# Patient Record
Sex: Female | Born: 1950
Health system: Southern US, Community
[De-identification: ages and names within clinical notes are randomized; demographics above are authoritative.]

## PROBLEM LIST (undated history)

## (undated) DIAGNOSIS — R600 Localized edema: Secondary | ICD-10-CM

## (undated) DIAGNOSIS — E785 Hyperlipidemia, unspecified: Secondary | ICD-10-CM

## (undated) DIAGNOSIS — Z8489 Family history of other specified conditions: Secondary | ICD-10-CM

## (undated) DIAGNOSIS — R42 Dizziness and giddiness: Secondary | ICD-10-CM

## (undated) DIAGNOSIS — R490 Dysphonia: Secondary | ICD-10-CM

## (undated) DIAGNOSIS — Z9889 Other specified postprocedural states: Secondary | ICD-10-CM

## (undated) DIAGNOSIS — D649 Anemia, unspecified: Secondary | ICD-10-CM

## (undated) DIAGNOSIS — M1990 Unspecified osteoarthritis, unspecified site: Secondary | ICD-10-CM

## (undated) DIAGNOSIS — S060X9A Concussion with loss of consciousness of unspecified duration, initial encounter: Secondary | ICD-10-CM

## (undated) DIAGNOSIS — R112 Nausea with vomiting, unspecified: Secondary | ICD-10-CM

## (undated) DIAGNOSIS — K219 Gastro-esophageal reflux disease without esophagitis: Secondary | ICD-10-CM

## (undated) DIAGNOSIS — N281 Cyst of kidney, acquired: Secondary | ICD-10-CM

## (undated) DIAGNOSIS — K8020 Calculus of gallbladder without cholecystitis without obstruction: Secondary | ICD-10-CM

## (undated) DIAGNOSIS — T4145XA Adverse effect of unspecified anesthetic, initial encounter: Secondary | ICD-10-CM

## (undated) DIAGNOSIS — R319 Hematuria, unspecified: Secondary | ICD-10-CM

## (undated) DIAGNOSIS — N3941 Urge incontinence: Secondary | ICD-10-CM

## (undated) DIAGNOSIS — N390 Urinary tract infection, site not specified: Secondary | ICD-10-CM

## (undated) DIAGNOSIS — C801 Malignant (primary) neoplasm, unspecified: Secondary | ICD-10-CM

## (undated) DIAGNOSIS — T8859XA Other complications of anesthesia, initial encounter: Secondary | ICD-10-CM

## (undated) DIAGNOSIS — I10 Essential (primary) hypertension: Secondary | ICD-10-CM

## (undated) DIAGNOSIS — E663 Overweight: Secondary | ICD-10-CM

## (undated) DIAGNOSIS — Z8719 Personal history of other diseases of the digestive system: Secondary | ICD-10-CM

## (undated) DIAGNOSIS — Z87442 Personal history of urinary calculi: Secondary | ICD-10-CM

## (undated) DIAGNOSIS — G4730 Sleep apnea, unspecified: Secondary | ICD-10-CM

## (undated) DIAGNOSIS — E559 Vitamin D deficiency, unspecified: Secondary | ICD-10-CM

## (undated) DIAGNOSIS — R569 Unspecified convulsions: Secondary | ICD-10-CM

## (undated) HISTORY — DX: Gastro-esophageal reflux disease without esophagitis: K21.9

## (undated) HISTORY — DX: Cyst of kidney, acquired: N28.1

## (undated) HISTORY — DX: Hyperlipidemia, unspecified: E78.5

## (undated) HISTORY — DX: Urge incontinence: N39.41

## (undated) HISTORY — PX: BREAST BIOPSY: SHX20

## (undated) HISTORY — DX: Urinary tract infection, site not specified: N39.0

## (undated) HISTORY — DX: Hematuria, unspecified: R31.9

## (undated) HISTORY — DX: Unspecified osteoarthritis, unspecified site: M19.90

## (undated) HISTORY — DX: Vitamin D deficiency, unspecified: E55.9

## (undated) HISTORY — DX: Calculus of gallbladder without cholecystitis without obstruction: K80.20

## (undated) HISTORY — DX: Localized edema: R60.0

## (undated) HISTORY — DX: Dysphonia: R49.0

## (undated) HISTORY — DX: Anemia, unspecified: D64.9

## (undated) HISTORY — DX: Dizziness and giddiness: R42

## (undated) HISTORY — DX: Overweight: E66.3

---

## 1973-12-13 HISTORY — PX: TUBAL LIGATION: SHX77

## 1979-12-14 HISTORY — PX: ABDOMINAL HYSTERECTOMY: SHX81

## 1991-12-14 DIAGNOSIS — C801 Malignant (primary) neoplasm, unspecified: Secondary | ICD-10-CM

## 1991-12-14 HISTORY — DX: Malignant (primary) neoplasm, unspecified: C80.1

## 1998-05-05 ENCOUNTER — Emergency Department (HOSPITAL_COMMUNITY): Admission: EM | Admit: 1998-05-05 | Discharge: 1998-05-05 | Payer: Self-pay | Admitting: Emergency Medicine

## 1999-02-19 ENCOUNTER — Other Ambulatory Visit: Admission: RE | Admit: 1999-02-19 | Discharge: 1999-02-19 | Payer: Self-pay | Admitting: *Deleted

## 1999-05-04 ENCOUNTER — Emergency Department (HOSPITAL_COMMUNITY): Admission: EM | Admit: 1999-05-04 | Discharge: 1999-05-04 | Payer: Self-pay | Admitting: *Deleted

## 1999-05-04 ENCOUNTER — Encounter: Payer: Self-pay | Admitting: Emergency Medicine

## 2000-10-21 ENCOUNTER — Emergency Department (HOSPITAL_COMMUNITY): Admission: EM | Admit: 2000-10-21 | Discharge: 2000-10-21 | Payer: Self-pay | Admitting: Emergency Medicine

## 2003-02-28 ENCOUNTER — Emergency Department (HOSPITAL_COMMUNITY): Admission: EM | Admit: 2003-02-28 | Discharge: 2003-02-28 | Payer: Self-pay | Admitting: Emergency Medicine

## 2003-11-01 ENCOUNTER — Other Ambulatory Visit: Payer: Self-pay

## 2003-12-14 HISTORY — PX: HERNIA REPAIR: SHX51

## 2004-10-01 ENCOUNTER — Ambulatory Visit: Payer: Self-pay | Admitting: Internal Medicine

## 2004-10-21 ENCOUNTER — Emergency Department (HOSPITAL_COMMUNITY): Admission: EM | Admit: 2004-10-21 | Discharge: 2004-10-21 | Payer: Self-pay | Admitting: Family Medicine

## 2004-11-30 ENCOUNTER — Emergency Department (HOSPITAL_COMMUNITY): Admission: EM | Admit: 2004-11-30 | Discharge: 2004-11-30 | Payer: Self-pay | Admitting: Emergency Medicine

## 2006-04-10 ENCOUNTER — Emergency Department: Payer: Self-pay | Admitting: Internal Medicine

## 2006-04-10 ENCOUNTER — Other Ambulatory Visit: Payer: Self-pay

## 2006-12-30 ENCOUNTER — Emergency Department (HOSPITAL_COMMUNITY): Admission: EM | Admit: 2006-12-30 | Discharge: 2006-12-31 | Payer: Self-pay | Admitting: Emergency Medicine

## 2007-02-17 ENCOUNTER — Emergency Department: Payer: Self-pay | Admitting: Internal Medicine

## 2008-08-22 ENCOUNTER — Emergency Department (HOSPITAL_COMMUNITY): Admission: EM | Admit: 2008-08-22 | Discharge: 2008-08-23 | Payer: Self-pay | Admitting: Emergency Medicine

## 2008-08-29 ENCOUNTER — Ambulatory Visit: Payer: Self-pay | Admitting: Internal Medicine

## 2008-08-30 ENCOUNTER — Emergency Department: Payer: Self-pay | Admitting: Emergency Medicine

## 2009-08-06 ENCOUNTER — Emergency Department (HOSPITAL_COMMUNITY): Admission: EM | Admit: 2009-08-06 | Discharge: 2009-08-06 | Payer: Self-pay | Admitting: Emergency Medicine

## 2009-11-22 ENCOUNTER — Emergency Department (HOSPITAL_COMMUNITY): Admission: EM | Admit: 2009-11-22 | Discharge: 2009-11-22 | Payer: Self-pay | Admitting: Emergency Medicine

## 2010-01-17 ENCOUNTER — Encounter: Admission: RE | Admit: 2010-01-17 | Discharge: 2010-01-17 | Payer: Self-pay | Admitting: Sports Medicine

## 2011-01-03 ENCOUNTER — Encounter: Payer: Self-pay | Admitting: *Deleted

## 2011-05-12 ENCOUNTER — Ambulatory Visit: Payer: Self-pay | Admitting: Emergency Medicine

## 2014-07-17 DIAGNOSIS — L662 Folliculitis decalvans: Secondary | ICD-10-CM | POA: Insufficient documentation

## 2014-12-10 ENCOUNTER — Ambulatory Visit: Payer: Self-pay | Admitting: Urology

## 2015-01-08 ENCOUNTER — Emergency Department: Payer: Self-pay | Admitting: Emergency Medicine

## 2015-01-08 LAB — PROTIME-INR
INR: 1
Prothrombin Time: 13 secs (ref 11.5–14.7)

## 2015-01-08 LAB — COMPREHENSIVE METABOLIC PANEL
Albumin: 3.4 g/dL (ref 3.4–5.0)
Alkaline Phosphatase: 85 U/L (ref 46–116)
Anion Gap: 7 (ref 7–16)
BUN: 23 mg/dL — ABNORMAL HIGH (ref 7–18)
Bilirubin,Total: 0.2 mg/dL (ref 0.2–1.0)
Calcium, Total: 8.7 mg/dL (ref 8.5–10.1)
Chloride: 109 mmol/L — ABNORMAL HIGH (ref 98–107)
Co2: 29 mmol/L (ref 21–32)
Creatinine: 1.14 mg/dL (ref 0.60–1.30)
EGFR (African American): >60
EGFR (Non-African Amer.): 51 — ABNORMAL LOW
Glucose: 97 mg/dL (ref 65–99)
Osmolality: 292 (ref 275–301)
Potassium: 4 mmol/L (ref 3.5–5.1)
SGOT(AST): 26 U/L (ref 15–37)
SGPT (ALT): 28 U/L (ref 14–63)
Sodium: 145 mmol/L (ref 136–145)
Total Protein: 7.3 g/dL (ref 6.4–8.2)

## 2015-01-08 LAB — CBC WITH DIFFERENTIAL/PLATELET
Basophil #: 0.1 10*3/uL (ref 0.0–0.1)
Basophil %: 0.5 %
Eosinophil #: 0.2 10*3/uL (ref 0.0–0.7)
Eosinophil %: 1.3 %
HCT: 39.7 % (ref 35.0–47.0)
HGB: 12.5 g/dL (ref 12.0–16.0)
Lymphocyte #: 3.6 10*3/uL (ref 1.0–3.6)
Lymphocyte %: 31.5 %
MCH: 27.7 pg (ref 26.0–34.0)
MCHC: 31.5 g/dL — ABNORMAL LOW (ref 32.0–36.0)
MCV: 88 fL (ref 80–100)
Monocyte #: 1 x10 3/mm — ABNORMAL HIGH (ref 0.2–0.9)
Monocyte %: 8.4 %
Neutrophil #: 6.7 10*3/uL — ABNORMAL HIGH (ref 1.4–6.5)
Neutrophil %: 58.3 %
Platelet: 218 10*3/uL (ref 150–440)
RBC: 4.53 10*6/uL (ref 3.80–5.20)
RDW: 14 % (ref 11.5–14.5)
WBC: 11.5 10*3/uL — ABNORMAL HIGH (ref 3.6–11.0)

## 2015-04-13 NOTE — Consult Note (Signed)
PATIENT NAME:  Ann Barker, Ann Barker MR#:  [id number] DATE OF BIRTH:  1951/10/31  DATE OF CONSULTATION:  01/08/2015  ATTENDING PHYSICIAN:  Valli Glance. Owens Shark, MD  CONSULTING PHYSICIAN:  Roena Malady, MD  REASON FOR CONSULTATION:  Report request regarding foreign body in the upper aerodigestive tract.   HISTORY OF PRESENT ILLNESS:  This is a 64 year old female who at approximately 11:30 p.m. last evening was eating a cake pinwheel and subsequently after eating this felt like something was hung in her throat. She was then brought to the Emergency Room, where workup consisted of PA and lateral neck films that showed a foreign body stuck in the right, what appeared to be piriform sinus region. Since that time, she has felt like there is something present there. Her airway has never been compromised. Voice is slightly hoarse but more related to pain.   PAST MEDICAL HISTORY:  Unremarkable. She takes only a vitamin according to her daughter. She has a history of chronic back pain.   PAST SURGICAL HISTORY:  She had a hysterectomy and tubal ligation in the past.   SOCIAL HISTORY:  No alcohol or tobacco.   PHYSICAL EXAMINATION:  The external ears appeared normal. The anterior nose was patent. The oral cavity and oropharynx were benign. Palpation of the neck was benign. Her nose was topically decongested with oxymetazoline. A flexed fiber-optic laryngoscope was introduced into the back of the nose. She had a massive gagging response to this, and during that she coughed up a pin with a blue tip on the end. Immediately, she felt better. We retrieved this pin and kept it as a specimen. I repeated the laryngoscopy, this time no gag reflex. Piriform sinuses appeared clear. I could see no active bleeding. She had excellent vocal cord mobility.   IMPRESSION:  Foreign body, most likely right piriform sinus space on the x-ray. She coughed this up spontaneously during the exam. Repeat endoscopy showed no further  foreign body. I have recommended she be discharged to home on a clear liquid diet this morning, soft diet this afternoon, and regular diet tomorrow. She will follow up with me in my office on an as-needed basis.    ____________________________ Roena Malady, MD ctm:nb D: 01/08/2015 06:34:16 ET T: 01/08/2015 06:43:55 ET JOB#: [id number]  cc: Roena Malady, MD, <Dictator> Roena Malady MD ELECTRONICALLY SIGNED 02/07/2015 7:53

## 2015-06-13 ENCOUNTER — Other Ambulatory Visit: Payer: Self-pay | Admitting: Urology

## 2015-06-13 DIAGNOSIS — N2889 Other specified disorders of kidney and ureter: Secondary | ICD-10-CM

## 2015-07-09 DIAGNOSIS — M542 Cervicalgia: Secondary | ICD-10-CM | POA: Insufficient documentation

## 2015-08-16 ENCOUNTER — Other Ambulatory Visit: Payer: Self-pay

## 2015-08-16 ENCOUNTER — Encounter: Payer: Self-pay | Admitting: Emergency Medicine

## 2015-08-16 ENCOUNTER — Emergency Department
Admission: EM | Admit: 2015-08-16 | Discharge: 2015-08-16 | Disposition: A | Discharge status: Home / Self Care | Payer: Medicare HMO | Attending: Emergency Medicine | Admitting: Emergency Medicine

## 2015-08-16 ENCOUNTER — Emergency Department: Payer: Medicare HMO

## 2015-08-16 DIAGNOSIS — R2243 Localized swelling, mass and lump, lower limb, bilateral: Secondary | ICD-10-CM | POA: Insufficient documentation

## 2015-08-16 DIAGNOSIS — I10 Essential (primary) hypertension: Secondary | ICD-10-CM | POA: Insufficient documentation

## 2015-08-16 DIAGNOSIS — Z880 Allergy status to penicillin: Secondary | ICD-10-CM | POA: Diagnosis not present

## 2015-08-16 DIAGNOSIS — R609 Edema, unspecified: Secondary | ICD-10-CM

## 2015-08-16 HISTORY — DX: Essential (primary) hypertension: I10

## 2015-08-16 LAB — CBC WITH DIFFERENTIAL/PLATELET
Basophils Absolute: 0.1 10*3/uL (ref 0–0.1)
Basophils Relative: 1 %
Eosinophils Absolute: 0.2 10*3/uL (ref 0–0.7)
Eosinophils Relative: 2 %
HCT: 35.9 % (ref 35.0–47.0)
Hemoglobin: 11.6 g/dL — ABNORMAL LOW (ref 12.0–16.0)
Lymphocytes Relative: 31 %
Lymphs Abs: 2.6 10*3/uL (ref 1.0–3.6)
MCH: 28.4 pg (ref 26.0–34.0)
MCHC: 32.3 g/dL (ref 32.0–36.0)
MCV: 87.9 fL (ref 80.0–100.0)
Monocytes Absolute: 0.9 10*3/uL (ref 0.2–0.9)
Monocytes Relative: 11 %
Neutro Abs: 4.7 10*3/uL (ref 1.4–6.5)
Neutrophils Relative %: 55 %
Platelets: 221 10*3/uL (ref 150–440)
RBC: 4.08 MIL/uL (ref 3.80–5.20)
RDW: 14.4 % (ref 11.5–14.5)
WBC: 8.5 10*3/uL (ref 3.6–11.0)

## 2015-08-16 LAB — COMPREHENSIVE METABOLIC PANEL
ALT: 23 U/L (ref 14–54)
AST: 27 U/L (ref 15–41)
Albumin: 3.7 g/dL (ref 3.5–5.0)
Alkaline Phosphatase: 67 U/L (ref 38–126)
Anion gap: 5 (ref 5–15)
BUN: 18 mg/dL (ref 6–20)
CO2: 27 mmol/L (ref 22–32)
Calcium: 8.9 mg/dL (ref 8.9–10.3)
Chloride: 112 mmol/L — ABNORMAL HIGH (ref 101–111)
Creatinine, Ser: 0.89 mg/dL (ref 0.44–1.00)
GFR calc Af Amer: >60 mL/min (ref >60)
GFR calc non Af Amer: >60 mL/min (ref >60)
Glucose, Bld: 97 mg/dL (ref 65–99)
Potassium: 3.9 mmol/L (ref 3.5–5.1)
Sodium: 144 mmol/L (ref 135–145)
Total Bilirubin: 0.7 mg/dL (ref 0.3–1.2)
Total Protein: 7 g/dL (ref 6.5–8.1)

## 2015-08-16 LAB — BRAIN NATRIURETIC PEPTIDE: B Natriuretic Peptide: 79 pg/mL (ref 0.0–100.0)

## 2015-08-16 LAB — TROPONIN I: Troponin I: <0.03 ng/mL (ref <0.031)

## 2015-08-16 MED ORDER — HYDROCHLOROTHIAZIDE 12.5 MG PO CAPS: 12.5000 mg | ORAL_CAPSULE | Freq: Every day | ORAL | Status: DC

## 2015-08-16 MED ORDER — HYDROCHLOROTHIAZIDE 12.5 MG PO CAPS
12.5000 mg | ORAL_CAPSULE | Freq: Every day | ORAL | Status: DC
  Administered 2015-08-16: 12.5 mg via ORAL
  Filled 2015-08-16: qty 1

## 2015-08-16 NOTE — Discharge Instructions (Signed)
Peripheral Edema °You have swelling in your legs (peripheral edema). This swelling is due to excess accumulation of salt and water in your body. Edema may be a sign of heart, kidney or liver disease, or a side effect of a medication. It may also be due to problems in the leg veins. Elevating your legs and using special support stockings may be very helpful, if the cause of the swelling is due to poor venous circulation. Avoid long periods of standing, whatever the cause. °Treatment of edema depends on identifying the cause. Chips, pretzels, pickles and other salty foods should be avoided. Restricting salt in your diet is almost always needed. Water pills (diuretics) are often used to remove the excess salt and water from your body via urine. These medicines prevent the kidney from reabsorbing sodium. This increases urine flow. °Diuretic treatment may also result in lowering of potassium levels in your body. Potassium supplements may be needed if you have to use diuretics daily. Daily weights can help you keep track of your progress in clearing your edema. You should call your caregiver for follow up care as recommended. °SEEK IMMEDIATE MEDICAL CARE IF:  °· You have increased swelling, pain, redness, or heat in your legs. °· You develop shortness of breath, especially when lying down. °· You develop chest or abdominal pain, weakness, or fainting. °· You have a fever. °Document Released: 01/06/2005 Document Revised: 02/21/2012 Document Reviewed: 12/17/2009 °ExitCare® Patient Information ©2015 [employer]. This information is not intended to replace advice given to you by your health care provider. Make sure you discuss any questions you have with your health care provider. ° °

## 2015-08-16 NOTE — ED Notes (Signed)
Reports ankles swelling x 3 wks. Denies sob or cp.

## 2015-08-16 NOTE — ED Provider Notes (Signed)
[facility] Emergency Department Provider Note  ____________________________________________  Time seen: Approximately 11 AM  I have reviewed the triage vital signs and the nursing notes.   HISTORY  Chief Complaint Leg Swelling    HPI Ann Barker is a 64 y.o. female with a history of hypertension who is presenting today with bilateral lower extremity edema that is been increasing over the past month. She denies any shortness of breath or chest pain. She says that she has had swelling similar to this in the past however, today her left leg is greater in size than her right. In the past her right has been greater than her left. She said that it is resolved spontaneously in the past. She has no history of blood clots and does not take any anticoagulants. She does note a recent trip from [place]. No fever home. No injury to the lower extremities.   Past Medical History  Diagnosis Date  . Hypertension     There are no active problems to display for this patient.   Past Surgical History  Procedure Laterality Date  . Abdominal hysterectomy    . Hernia repair      No current outpatient prescriptions on file.  Allergies Codeine; Morphine and related; and Penicillins  History reviewed. No pertinent family history.  Social History Social History  Substance Use Topics  . Smoking status: Never Smoker   . Smokeless tobacco: None  . Alcohol Use: No    Review of Systems Constitutional: No fever/chills Eyes: No visual changes. ENT: No sore throat. Cardiovascular: Denies chest pain. Respiratory: Denies shortness of breath. Gastrointestinal: No abdominal pain.  No nausea, no vomiting.  No diarrhea.  No constipation. Genitourinary: Negative for dysuria. Musculoskeletal: Negative for back pain. Skin: Negative for rash. Neurological: Negative for headaches, focal weakness or numbness.  10-point ROS otherwise  negative.  ____________________________________________   PHYSICAL EXAM:  VITAL SIGNS: ED Triage Vitals  Enc Vitals Group     BP 08/16/15 0848 188/107 mmHg     Pulse Rate 08/16/15 0848 73     Resp 08/16/15 0848 18     Temp 08/16/15 0848 98.1 F (36.7 C)     Temp Source 08/16/15 0848 Oral     SpO2 08/16/15 0848 100 %     Weight 08/16/15 0848 238 lb (107.956 kg)     Height 08/16/15 0848 5\' 4"  (1.626 m)     Head Cir --      Peak Flow --      Pain Score 08/16/15 1122 6     Pain Loc --      Pain Edu? --      Excl. in [place]? --     Constitutional: Alert and oriented. Well appearing and in no acute distress. Eyes: Conjunctivae are normal. PERRL. EOMI. Head: Atraumatic. Nose: No congestion/rhinnorhea. Mouth/Throat: Mucous membranes are moist.  Oropharynx non-erythematous. Neck: No stridor.   Cardiovascular: Normal rate, regular rhythm. Grossly normal heart sounds.  Good peripheral circulation. Respiratory: Normal respiratory effort.  No retractions. Lungs CTAB. Gastrointestinal: Soft and nontender. No distention. No abdominal bruits. No CVA tenderness. Musculoskeletal: Bilateral lower extremity edema which is moderate to severe and greater on the left side. There are bilateral dorsalis pedis pulses that are equal. Patient is able to range her toes.  Neurologic:  Normal speech and language. No gross focal neurologic deficits are appreciated. No gait instability. Skin:  Skin is warm, dry and intact. No rash noted. Psychiatric: Mood and affect are normal.  Speech and behavior are normal.  ____________________________________________   LABS (all labs ordered are listed, but only abnormal results are displayed)  Labs Reviewed  COMPREHENSIVE METABOLIC PANEL - Abnormal; Notable for the following:    Chloride 112 (*)    All other components within normal limits  CBC WITH DIFFERENTIAL/PLATELET - Abnormal; Notable for the following:    Hemoglobin 11.6 (*)    All other components within  normal limits  BRAIN NATRIURETIC PEPTIDE  TROPONIN I   ____________________________________________  EKG  ED ECG REPORT I, Doran Stabler, the attending physician, personally viewed and interpreted this ECG.   Date: 08/16/2015  EKG Time: 1105  Rate: 65  Rhythm: normal EKG, normal sinus rhythm  Axis: Normal axis  Intervals:none  ST&T Change: No ST elevations or depressions. No abnormal T-wave inversions.  ____________________________________________  RADIOLOGY  Mild cardiomegaly. I personally reviewed these films.  No evidence of lower extremity deep vein thrombosis. ____________________________________________   PROCEDURES    ____________________________________________   INITIAL IMPRESSION / ASSESSMENT AND PLAN / ED COURSE  Pertinent labs & imaging results that were available during my care of the patient were reviewed by me and considered in my medical decision making (see chart for details).  ----------------------------------------- 1:09 PM on 08/16/2015 -----------------------------------------  Patient is resting comfortably at this time. Counseled the patient about her lab results as well as imaging results and that there was no sign of deep vein thrombosis. I do not think the patient has cellulitis. She has no elevation in her white count or fever. She says that she has had a previous similar issue that resolved spontaneously. She does say that she used to wear compression stockings. She does keep her legs elevated at night but she says that the swelling has not gone down overnight. She said that she used to be on hydrochlorothiazide which helped her leg swelling which is often now. She said she had some issues with the veins in her lower legs that she needed this for in the past. I will restart her on her chlorothiazide which she will need for her blood pressure as well as her swelling. She says that she will be able to get a replacement pain or of  compression stockings. Discharged home and will follow-up at primary care. Currently does not have primary care and we'll give number for [facility]. Patient does have insurance and says that she will call to make an appointment. ____________________________________________   FINAL CLINICAL IMPRESSION(S) / ED DIAGNOSES  Acute peripheral edema. Initial visit.    Orbie Pyo, MD 08/16/15 [phone]

## 2015-08-16 NOTE — ED Notes (Addendum)
Pt reports being diagnosed 11/2014 with lesions on kidneys for which she is seeing Dr. Erlene Quan in [place].

## 2015-08-16 NOTE — ED Notes (Signed)
Pt presents with bil ankle and lower leg swelling, non-pitting edema to bil feet, pitting to bil lower leg, +2/+3, left worse than right.

## 2015-09-09 ENCOUNTER — Ambulatory Visit (INDEPENDENT_AMBULATORY_CARE_PROVIDER_SITE_OTHER): Payer: Medicare HMO | Admitting: Primary Care

## 2015-09-09 ENCOUNTER — Encounter: Payer: Self-pay | Admitting: Primary Care

## 2015-09-09 VITALS — BP 120/88 | HR 81 | Temp 97.6°F | Ht 64.0 in | Wt 244.8 lb

## 2015-09-09 DIAGNOSIS — R600 Localized edema: Secondary | ICD-10-CM

## 2015-09-09 DIAGNOSIS — Z87448 Personal history of other diseases of urinary system: Secondary | ICD-10-CM | POA: Diagnosis not present

## 2015-09-09 DIAGNOSIS — D649 Anemia, unspecified: Secondary | ICD-10-CM

## 2015-09-09 DIAGNOSIS — I10 Essential (primary) hypertension: Secondary | ICD-10-CM

## 2015-09-09 DIAGNOSIS — R3129 Other microscopic hematuria: Secondary | ICD-10-CM | POA: Insufficient documentation

## 2015-09-09 NOTE — Progress Notes (Signed)
Subjective:    Patient ID: Ann Barker Roles, female    DOB: 11-05-1951, 64 y.o.   MRN: [id number]  HPI  [name] is a 64 year old female who presents today to establish care and discuss the problems mentioned below. Will obtain old records.  1) Lower extremity edema: Longstanding history for the past several years. She was evaluated in the ED on 08/16/2015 for lower extremity edema that had been increasing over the past month. She underwent evaluation with labs, ECG, ultrasound of her lower extremities, and xray. All testing was unremarkable except for mild cardiomegaly in xray. She was restarted on her HCTZ 12.5 mg for hypertension and lower extremity edema. Overall her lower extremity edema has reduced but continues to be elevated and bothersome, especially to her left lower extremity. She's been wearing compression hose and elevating her legs when at rest. She's had vascular surgery to her veins in May of 2014 through December 2015. Denies chest pain and cough. Will get shortness of breath while climbing stairs.   2) Essential Hypertension: Diagnosed several years ago with fluctuations in numbers over the years. Currently managed on HCTZ 12.5 mg. Denies chest pain.   BP Readings from Last 3 Encounters:  09/09/15 120/88  08/16/15 187/98     3) Anemia: Longstanding history of in the past. CBC in 08/2015 with WNL. She's had episodes of fainting spells in the past.   4) Hematuria: Diagnosed in October 2015 and saw [facility] around then and was told to follow up 6 months later. She was unable to follow up at that time but would like to follow up now. She denies hematuria, dysuria, flank pain.  Review of Systems  Constitutional: Negative for unexpected weight change.  HENT: Negative for rhinorrhea.   Respiratory: Negative for cough and shortness of breath.   Cardiovascular: Positive for leg swelling. Negative for chest pain.  Gastrointestinal: Negative for diarrhea and  constipation.  Genitourinary: Negative for hematuria and difficulty urinating.  Musculoskeletal: Positive for neck pain.  Skin: Negative for rash.  Allergic/Immunologic: Negative for environmental allergies.  Neurological: Positive for headaches.  Psychiatric/Behavioral:       Denies concerns for anxiety or depression       Past Medical History  Diagnosis Date  . Hypertension   . Hematuria   . Lower extremity edema   . Anemia     Social History   Social History  . Marital Status: Married    Spouse Name: N/A  . Number of Children: N/A  . Years of Education: N/A   Occupational History  . Not on file.   Social History Main Topics  . Smoking status: Never Smoker   . Smokeless tobacco: Not on file  . Alcohol Use: No  . Drug Use: Not on file  . Sexual Activity: Not on file   Other Topics Concern  . Not on file   Social History Narrative    Past Surgical History  Procedure Laterality Date  . Abdominal hysterectomy    . Hernia repair      History reviewed. No pertinent family history.  Allergies  Allergen Reactions  . Methylprednisolone Swelling  . Codeine Palpitations  . Morphine And Related Palpitations  . Penicillins Rash    Current Outpatient Prescriptions on File Prior to Visit  Medication Sig Dispense Refill  . hydrochlorothiazide (MICROZIDE) 12.5 MG capsule Take 1 capsule (12.5 mg total) by mouth daily. 30 capsule 0   No current facility-administered medications on file prior to  visit.    BP 120/88 mmHg  Pulse 81  Temp(Src) 97.6 F (36.4 C) (Oral)  Ht 5\' 4"  (1.626 m)  Wt 244 lb 12.8 oz (111.041 kg)  BMI 42.00 kg/m2  SpO2 98%    Objective:   Physical Exam  Constitutional: She is oriented to person, place, and time. She appears well-nourished.  Cardiovascular: Normal rate and regular rhythm.   No murmur heard. Pulses:      Dorsalis pedis pulses are 2+ on the right side, and 2+ on the left side.       Posterior tibial pulses are 2+ on  the right side, and 2+ on the left side.  Bilateral lower extremity edema present with trace pitting. Left leg with greater edema than right.  Pulmonary/Chest: Effort normal and breath sounds normal.  Neurological: She is alert and oriented to person, place, and time.  Skin: Skin is warm and dry. No erythema.  Psychiatric: She has a normal mood and affect.          Assessment & Plan:

## 2015-09-09 NOTE — Assessment & Plan Note (Signed)
Present for years, worse over past month. Work up in ED negative except for mild cardiomegaly on xray. This included BNP and [place] of lower extremities. She was restarted on HCTZ 12.5 mg with some reduction in swelling. Moderate swelling today, but has not had HCTZ today. Discussed daily use. Due to BP I am reticent to increase her dose today. Will obtain 2D echo to rule out CHF or cardiac abnormalities. Follow up in 1 month for re-evaluation. Will consider referral to cards if necessary.

## 2015-09-09 NOTE — Assessment & Plan Note (Signed)
History of years ago with fluctuation in readings. Restarted on HCTZ 12.5 mg in ED in early September. She has not been taking her medication daily and has not had it today. Discussed daily use of this medication and she is to report any dizziness or near syncope. BP stable today.

## 2015-09-09 NOTE — Progress Notes (Signed)
Pre visit review using our clinic review tool, if applicable. No additional management support is needed unless otherwise documented below in the visit note. 

## 2015-09-09 NOTE — Assessment & Plan Note (Signed)
History of 1 year ago. Once evaluated by Urology, was told to follow up in 6 months, she was unable to do so. She is requesting follow up. Referral sent to [facility]. Denies gross hematuria.

## 2015-09-09 NOTE — Patient Instructions (Addendum)
Stop by the front and speak with Rosaria Ferries regarding your echocardiogram and referral to [facility].  Continue to use compression hose and elevating your feet at night. I will contact you with the results of your echocardiogram.  Please schedule a follow up appointment in 1 month for lower leg swelling.  It was a pleasure to meet you today! Please don't hesitate to call me with any questions. Welcome to [facility]!

## 2015-09-09 NOTE — Assessment & Plan Note (Signed)
History of in the past. Recent CBC reviewed from early September and is stable. History of syncope due to anemia. Will continue to monitor.

## 2015-09-11 ENCOUNTER — Encounter: Payer: Self-pay | Admitting: *Deleted

## 2015-09-12 ENCOUNTER — Other Ambulatory Visit: Payer: Medicare HMO

## 2015-09-16 ENCOUNTER — Ambulatory Visit (INDEPENDENT_AMBULATORY_CARE_PROVIDER_SITE_OTHER): Payer: Commercial Managed Care - HMO | Admitting: Urology

## 2015-09-16 ENCOUNTER — Encounter: Payer: Self-pay | Admitting: Urology

## 2015-09-16 VITALS — BP 148/104 | HR 89 | Ht 64.5 in | Wt 242.3 lb

## 2015-09-16 DIAGNOSIS — R3129 Other microscopic hematuria: Secondary | ICD-10-CM

## 2015-09-16 DIAGNOSIS — N281 Cyst of kidney, acquired: Secondary | ICD-10-CM | POA: Insufficient documentation

## 2015-09-16 DIAGNOSIS — Q6102 Congenital multiple renal cysts: Secondary | ICD-10-CM

## 2015-09-16 DIAGNOSIS — R350 Frequency of micturition: Secondary | ICD-10-CM | POA: Diagnosis not present

## 2015-09-16 DIAGNOSIS — R351 Nocturia: Secondary | ICD-10-CM | POA: Diagnosis not present

## 2015-09-16 LAB — MICROSCOPIC EXAMINATION

## 2015-09-16 LAB — URINALYSIS, COMPLETE
Bilirubin, UA: NEGATIVE
Glucose, UA: NEGATIVE
Ketones, UA: NEGATIVE
Nitrite, UA: NEGATIVE
Protein, UA: NEGATIVE
Specific Gravity, UA: 1.015 (ref 1.005–1.030)
Urobilinogen, Ur: 0.2 mg/dL (ref 0.2–1.0)
pH, UA: 5 (ref 5.0–7.5)

## 2015-09-16 LAB — BLADDER SCAN AMB NON-IMAGING
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
## 2015-09-17 ENCOUNTER — Ambulatory Visit: Payer: Medicare HMO | Admitting: Obstetrics and Gynecology

## 2015-09-25 ENCOUNTER — Ambulatory Visit
Admission: RE | Admit: 2015-09-25 | Discharge: 2015-09-25 | Disposition: A | Discharge status: Home / Self Care | Payer: Commercial Managed Care - HMO | Source: Ambulatory Visit | Attending: Urology | Admitting: Urology

## 2015-09-25 ENCOUNTER — Other Ambulatory Visit: Payer: Self-pay | Admitting: Primary Care

## 2015-09-25 DIAGNOSIS — Q6102 Congenital multiple renal cysts: Secondary | ICD-10-CM | POA: Diagnosis not present

## 2015-09-25 DIAGNOSIS — R600 Localized edema: Secondary | ICD-10-CM

## 2015-09-25 DIAGNOSIS — R0600 Dyspnea, unspecified: Secondary | ICD-10-CM

## 2015-09-25 DIAGNOSIS — N281 Cyst of kidney, acquired: Secondary | ICD-10-CM | POA: Diagnosis not present

## 2015-09-26 ENCOUNTER — Other Ambulatory Visit: Payer: Self-pay | Admitting: Primary Care

## 2015-09-26 ENCOUNTER — Other Ambulatory Visit: Payer: Self-pay

## 2015-09-26 ENCOUNTER — Ambulatory Visit (INDEPENDENT_AMBULATORY_CARE_PROVIDER_SITE_OTHER): Payer: Commercial Managed Care - HMO

## 2015-09-26 DIAGNOSIS — R0600 Dyspnea, unspecified: Secondary | ICD-10-CM | POA: Diagnosis not present

## 2015-09-26 DIAGNOSIS — D649 Anemia, unspecified: Secondary | ICD-10-CM

## 2015-09-26 DIAGNOSIS — I517 Cardiomegaly: Secondary | ICD-10-CM | POA: Diagnosis not present

## 2015-09-26 DIAGNOSIS — R600 Localized edema: Secondary | ICD-10-CM

## 2015-09-29 ENCOUNTER — Telehealth: Payer: Self-pay | Admitting: Urology

## 2015-09-29 DIAGNOSIS — R3129 Other microscopic hematuria: Secondary | ICD-10-CM

## 2015-09-29 NOTE — Telephone Encounter (Signed)
I called to make Mrs. Ann Barker her follow up for her [place] results and she expressed to me that she is having financial problems right now and if at all possible can you please call her with these results? If for some reason she does need to come in the office we can just bill her copay for this day..  Thanks, Sharyn Lull

## 2015-10-01 NOTE — Telephone Encounter (Signed)
Patient's renal ultrasound demonstrated a benign renal cyst. She still continues to have microscopic hematuria and needs a referral to a nephrologist.

## 2015-10-02 NOTE — Telephone Encounter (Signed)
A nephrologist referral will be fine at this time.

## 2015-10-02 NOTE — Telephone Encounter (Signed)
Spoke with pt in reference to micro heme workup and nephrologist referral. Referral put in que. What else does the pt need to complete hematuria workup?

## 2015-10-03 NOTE — Telephone Encounter (Signed)
Pt made aware of just needing the referral right now. Pt voiced understanding.

## 2015-10-07 ENCOUNTER — Telehealth: Payer: Self-pay | Admitting: Radiology

## 2015-10-07 NOTE — Telephone Encounter (Signed)
Pt called saying she received a call to set up an appt with [facility] but was told by "someone" that she was to have "dye shot up in me" and needs to know if that should be done prior to making an appt with the nephrologist. Per Zara Council pt needs to see the nephrologist. Larene Beach wasn't sure what pt meant about the dye. Pt voices understanding.

## 2015-10-10 ENCOUNTER — Ambulatory Visit: Payer: Medicare HMO | Admitting: Primary Care

## 2015-11-19 DIAGNOSIS — L662 Folliculitis decalvans: Secondary | ICD-10-CM | POA: Diagnosis not present

## 2015-11-19 DIAGNOSIS — L659 Nonscarring hair loss, unspecified: Secondary | ICD-10-CM | POA: Diagnosis not present

## 2015-11-19 DIAGNOSIS — I8312 Varicose veins of left lower extremity with inflammation: Secondary | ICD-10-CM | POA: Diagnosis not present

## 2015-11-19 DIAGNOSIS — I8311 Varicose veins of right lower extremity with inflammation: Secondary | ICD-10-CM | POA: Diagnosis not present

## 2015-11-20 DIAGNOSIS — I872 Venous insufficiency (chronic) (peripheral): Secondary | ICD-10-CM | POA: Insufficient documentation

## 2015-12-10 ENCOUNTER — Ambulatory Visit (INDEPENDENT_AMBULATORY_CARE_PROVIDER_SITE_OTHER): Payer: Commercial Managed Care - HMO | Admitting: Internal Medicine

## 2015-12-10 ENCOUNTER — Encounter: Payer: Self-pay | Admitting: Internal Medicine

## 2015-12-10 VITALS — BP 130/84 | HR 98 | Temp 98.3°F | Wt 241.0 lb

## 2015-12-10 DIAGNOSIS — J069 Acute upper respiratory infection, unspecified: Secondary | ICD-10-CM

## 2015-12-10 MED ORDER — AZITHROMYCIN 250 MG PO TABS: ORAL_TABLET | ORAL | Status: DC

## 2015-12-10 NOTE — Patient Instructions (Signed)
Upper Respiratory Infection, Adult Most upper respiratory infections (URIs) are a viral infection of the air passages leading to the lungs. A URI affects the nose, throat, and upper air passages. The most common type of URI is nasopharyngitis and is typically referred to as "the common cold." URIs run their course and usually go away on their own. Most of the time, a URI does not require medical attention, but sometimes a bacterial infection in the upper airways can follow a viral infection. This is called a secondary infection. Sinus and middle ear infections are common types of secondary upper respiratory infections. Bacterial pneumonia can also complicate a URI. A URI can worsen asthma and chronic obstructive pulmonary disease (COPD). Sometimes, these complications can require emergency medical care and may be life threatening.  CAUSES Almost all URIs are caused by viruses. A virus is a type of germ and can spread from one person to another.  RISKS FACTORS You may be at risk for a URI if:   You smoke.   You have chronic heart or lung disease.  You have a weakened defense (immune) system.   You are very young or very old.   You have nasal allergies or asthma.  You work in crowded or poorly ventilated areas.  You work in health care facilities or schools. SIGNS AND SYMPTOMS  Symptoms typically develop 2-3 days after you come in contact with a cold virus. Most viral URIs last 7-10 days. However, viral URIs from the influenza virus (flu virus) can last 14-18 days and are typically more severe. Symptoms may include:   Runny or stuffy (congested) nose.   Sneezing.   Cough.   Sore throat.   Headache.   Fatigue.   Fever.   Loss of appetite.   Pain in your forehead, behind your eyes, and over your cheekbones (sinus pain).  Muscle aches.  DIAGNOSIS  Your health care provider may diagnose a URI by:  Physical exam.  Tests to check that your symptoms are not due to  another condition such as:  Strep throat.  Sinusitis.  Pneumonia.  Asthma. TREATMENT  A URI goes away on its own with time. It cannot be cured with medicines, but medicines may be prescribed or recommended to relieve symptoms. Medicines may help:  Reduce your fever.  Reduce your cough.  Relieve nasal congestion. HOME CARE INSTRUCTIONS   Take medicines only as directed by your health care provider.   Gargle warm saltwater or take cough drops to comfort your throat as directed by your health care provider.  Use a warm mist humidifier or inhale steam from a shower to increase air moisture. This may make it easier to breathe.  Drink enough fluid to keep your urine clear or pale yellow.   Eat soups and other clear broths and maintain good nutrition.   Rest as needed.   Return to work when your temperature has returned to normal or as your health care provider advises. You may need to stay home longer to avoid infecting others. You can also use a face mask and careful hand washing to prevent spread of the virus.  Increase the usage of your inhaler if you have asthma.   Do not use any tobacco products, including cigarettes, chewing tobacco, or electronic cigarettes. If you need help quitting, ask your health care provider. PREVENTION  The best way to protect yourself from getting a cold is to practice good hygiene.   Avoid oral or hand contact with people with cold   symptoms.   Wash your hands often if contact occurs.  There is no clear evidence that vitamin C, vitamin E, echinacea, or exercise reduces the chance of developing a cold. However, it is always recommended to get plenty of rest, exercise, and practice good nutrition.  SEEK MEDICAL CARE IF:   You are getting worse rather than better.   Your symptoms are not controlled by medicine.   You have chills.  You have worsening shortness of breath.  You have brown or red mucus.  You have yellow or brown nasal  discharge.  You have pain in your face, especially when you bend forward.  You have a fever.  You have swollen neck glands.  You have pain while swallowing.  You have white areas in the back of your throat. SEEK IMMEDIATE MEDICAL CARE IF:   You have severe or persistent:  Headache.  Ear pain.  Sinus pain.  Chest pain.  You have chronic lung disease and any of the following:  Wheezing.  Prolonged cough.  Coughing up blood.  A change in your usual mucus.  You have a stiff neck.  You have changes in your:  Vision.  Hearing.  Thinking.  Mood. MAKE SURE YOU:   Understand these instructions.  Will watch your condition.  Will get help right away if you are not doing well or get worse.   This information is not intended to replace advice given to you by your health care provider. Make sure you discuss any questions you have with your health care provider.   Document Released: 05/25/2001 Document Revised: 04/15/2015 Document Reviewed: 03/06/2014 Elsevier Interactive Patient Education 2016 [employer] Inc[employer]  

## 2015-12-10 NOTE — Progress Notes (Signed)
Pre visit review using our clinic review tool, if applicable. No additional management support is needed unless otherwise documented below in the visit note. 

## 2015-12-10 NOTE — Progress Notes (Signed)
HPI  Pt presents to the clinic today with c/o headache, nasal congestion and cough. This started 2-3 days ago. She is blowing clear mucous out of her nose. The cough is productive of green mucous. The cough does seem worse at night. She reports has run low grade fevers, had chills and body aches. She has tried Advil Cold and Flu and Tyelnol without any relief. She has no history of allergies or breathing problems. She has been going to the hospital with her daughter.  Review of Systems      Past Medical History  Diagnosis Date  . Hypertension   . Hematuria     microscopic  . Lower extremity edema   . Anemia   . Arthritis   . Acid reflux   . UTI (lower urinary tract infection)   . Hoarseness   . HLD (hyperlipidemia)   . Vitamin D deficiency   . Dizziness   . Urge incontinence   . Renal cyst   . Gallstones   . Over weight     Family History  Problem Relation Age of Onset  . Prostate cancer Neg Hx   . Kidney disease Neg Hx   . Bladder Cancer Neg Hx     Social History   Social History  . Marital Status: Married    Spouse Name: N/A  . Number of Children: N/A  . Years of Education: N/A   Occupational History  . Not on file.   Social History Main Topics  . Smoking status: Never Smoker   . Smokeless tobacco: Not on file  . Alcohol Use: No  . Drug Use: No  . Sexual Activity: Not on file   Other Topics Concern  . Not on file   Social History Narrative    Allergies  Allergen Reactions  . Methylprednisolone Swelling  . Codeine Palpitations  . Morphine And Related Palpitations  . Penicillins Rash     Constitutional: Positive headache, fatigue and fever. Denies abrupt weight changes.  HEENT:  Positive nasal congestion, sore throat. Denies eye redness, eye pain, pressure behind the eyes, facial pain, ear pain, ringing in the ears, wax buildup, runny nose or bloody nose. Respiratory: Positive cough. Denies difficulty breathing or shortness of breath.   Cardiovascular: Denies chest pain, chest tightness, palpitations or swelling in the hands or feet.   No other specific complaints in a complete review of systems (except as listed in HPI above).  Objective:   BP 130/84 mmHg  Pulse 98  Temp(Src) 98.3 F (36.8 C) (Oral)  Wt 241 lb (109.317 kg)  SpO2 98%  Wt Readings from Last 3 Encounters:  12/10/15 241 lb (109.317 kg)  09/16/15 242 lb 4.8 oz (109.907 kg)  09/09/15 244 lb 12.8 oz (111.041 kg)     General: Appears her stated age, ill appearing in NAD. HEENT: Head: normal shape and size, no sinus tenderness noted; Eyes: sclera white, no icterus, conjunctiva pink; Ears: Tm's pink but intact, normal light reflex; Nose: mucosa pink and moist, septum midline; Throat/Mouth: + PND. Teeth present, mucosa erythematous and moist, no exudate noted, no lesions or ulcerations noted.  Neck: No cervical lymphadenopathy.  Cardiovascular: Normal rate and rhythm. S1,S2 noted.  No murmur, rubs or gallops noted.  Pulmonary/Chest: Normal effort and positive vesicular breath sounds. No respiratory distress. No wheezes, rales or ronchi noted.      Assessment & Plan:   Upper Respiratory Infection:  Get some rest and drink plenty of water Do salt water gargles for  the sore throat eRx for Azithromax x 5 days Delsym as needed for cough Sudafed as needed for nasal congestion, she reports she can not take Flonase. Advised her not to take Flonase for > 3 days.  RTC as needed or if symptoms persist.

## 2016-02-03 ENCOUNTER — Encounter: Payer: Self-pay | Admitting: Primary Care

## 2016-02-03 ENCOUNTER — Ambulatory Visit (INDEPENDENT_AMBULATORY_CARE_PROVIDER_SITE_OTHER): Payer: Commercial Managed Care - HMO | Admitting: Primary Care

## 2016-02-03 ENCOUNTER — Ambulatory Visit (INDEPENDENT_AMBULATORY_CARE_PROVIDER_SITE_OTHER)
Admission: RE | Admit: 2016-02-03 | Discharge: 2016-02-03 | Disposition: A | Discharge status: Home / Self Care | Payer: Commercial Managed Care - HMO | Source: Ambulatory Visit | Attending: Primary Care | Admitting: Primary Care

## 2016-02-03 VITALS — BP 130/84 | HR 82 | Temp 97.6°F | Ht 64.5 in | Wt 248.8 lb

## 2016-02-03 DIAGNOSIS — M79671 Pain in right foot: Secondary | ICD-10-CM

## 2016-02-03 NOTE — Progress Notes (Signed)
Subjective:    Patient ID: Ann Barker, female    DOB: [birth year], 65 y.o.   MRN: [id number]  HPI  Ann Barker is a 65 year old female who presents today with a chief complaint of foot pain. Her pain is located to the dorsal aspect of her right foot. She was evaluated by [facility] in [place] who suggested she needed surgery for an old fracture to the 1st or 2nd metatarsal bones. She fell in 2010 and believes she fractured her foot then.   Since her fall in 2010 she's noticed gradual swelling and discomfort. Over the past several months she's noticed more bothersome symptoms of swelling, difficulty ambulating, and discomfort. She will have to purchase shoes that are 1 size larger in order to be more comfortable.   Denies recent falls, fevers. She is requesting a referral to Podiatry for further evaluation.   Review of Systems  Constitutional: Negative for fever.  Musculoskeletal: Positive for arthralgias and gait problem.  Skin: Negative for color change.       Past Medical History  Diagnosis Date  . Hypertension   . Hematuria     microscopic  . Lower extremity edema   . Anemia   . Arthritis   . Acid reflux   . UTI (lower urinary tract infection)   . Hoarseness   . HLD (hyperlipidemia)   . Vitamin D deficiency   . Dizziness   . Urge incontinence   . Renal cyst   . Gallstones   . Over weight     Social History   Social History  . Marital Status: Married    Spouse Name: N/A  . Number of Children: N/A  . Years of Education: N/A   Occupational History  . Not on file.   Social History Main Topics  . Smoking status: Never Smoker   . Smokeless tobacco: Not on file  . Alcohol Use: No  . Drug Use: No  . Sexual Activity: Not on file   Other Topics Concern  . Not on file   Social History Narrative    Past Surgical History  Procedure Laterality Date  . Abdominal hysterectomy  1981  . Hernia repair  2005  . Breast biopsy  1995/2005  . Tubal  ligation  1975    Family History  Problem Relation Age of Onset  . Prostate cancer Neg Hx   . Kidney disease Neg Hx   . Bladder Cancer Neg Hx     Allergies  Allergen Reactions  . Morphine Other (See Comments)    Unknown  . Methylprednisolone Swelling  . Codeine Palpitations and Other (See Comments)    unknown  . Morphine And Related Palpitations  . Penicillins Rash and Other (See Comments)    unknown    Current Outpatient Prescriptions on File Prior to Visit  Medication Sig Dispense Refill  . BIOTIN PO Take 1 capsule by mouth daily.    . Cholecalciferol (VITAMIN D PO) Take 50,000 Units by mouth daily.    . hydrochlorothiazide (MICROZIDE) 12.5 MG capsule Take 1 capsule (12.5 mg total) by mouth daily. 30 capsule 0  . Multiple Vitamin (MULTIVITAMIN) capsule Take 1 capsule by mouth daily.     No current facility-administered medications on file prior to visit.    BP 130/84 mmHg  Pulse 82  Temp(Src) 97.6 F (36.4 C) (Oral)  Ht 5' 4.5" (1.638 m)  Wt 248 lb 12.8 oz (112.855 kg)  BMI 42.06 kg/m2  SpO2 99%  Objective:   Physical Exam  Constitutional: She appears well-nourished.  Cardiovascular: Normal rate and regular rhythm.   Pulses:      Dorsalis pedis pulses are 2+ on the right side, and 2+ on the left side.       Posterior tibial pulses are 2+ on the right side, and 2+ on the left side.  Pulmonary/Chest: Effort normal and breath sounds normal.  Musculoskeletal:  Pain to right great toe with plantar flexion. Pain to right dorsal foot with plantar flexion. 1 cm raised, bony feeling knot to right dorsal foot along 1st metatarsal bone.  Skin: Skin is warm and dry.          Assessment & Plan:  Foot Pain:  Located to right dorsal foot for years. History of prior fracture. She's taken no action although it was recommended she complete surgery. Pain with dorsoflexion and plantar flexion. Xrays ordered today and are pending. Referral placed to podiatry.

## 2016-02-03 NOTE — Patient Instructions (Signed)
Complete xray(s) prior to leaving today. I will notify you of your results once received.  Stop by the front desk and speak with either Rosaria Ferries or Ebony Hail regarding your referral to Podiatry.  It was a pleasure to see you today!

## 2016-02-03 NOTE — Progress Notes (Signed)
Pre visit review using our clinic review tool, if applicable. No additional management support is needed unless otherwise documented below in the visit note. 

## 2016-02-17 ENCOUNTER — Encounter: Payer: Self-pay | Admitting: Podiatry

## 2016-02-17 ENCOUNTER — Ambulatory Visit (INDEPENDENT_AMBULATORY_CARE_PROVIDER_SITE_OTHER): Payer: Commercial Managed Care - HMO | Admitting: Podiatry

## 2016-02-17 VITALS — BP 104/75 | HR 64 | Resp 16 | Ht 64.5 in | Wt 240.0 lb

## 2016-02-17 DIAGNOSIS — M205X1 Other deformities of toe(s) (acquired), right foot: Secondary | ICD-10-CM | POA: Diagnosis not present

## 2016-02-17 NOTE — Progress Notes (Signed)
   Subjective:    Patient ID: Ann Barker, female    DOB: 07/22/1951, 65 y.o.   MRN: [id number]  HPI: She presents today with a 7 year duration of pain to her right foot. States that in 2010 she fell at [facility] and broke her foot. She states that she recently had x-rays in the doctor told her that her foot was still broken. She states that hurts when she walks particularly on her lesser metatarsals. She denies any other trauma since 2010 and states it is hard to walk and wear regular shoe gear. She states is becoming depressing and has started to affect her ability to perform her daily activities.  Review of Systems  Constitutional: Positive for fatigue.  HENT: Positive for sinus pressure and sore throat.   Respiratory: Positive for cough.   Endocrine: Positive for polyuria.  Neurological: Positive for weakness and headaches.  All other systems reviewed and are negative.      Objective:   Physical Exam: She presents today vital signs stable alert and oriented 3. I have reviewed her past medical history medications allergies surgery social history and review of systems. Pulses are strongly palpable. Neurologic sensorium is intact per Semmes-Weinstein monofilament. Deep tendon reflexes are intact. Muscle strength is normal bilateral. She has no pain on palpation of the lesser metatarsals of the right foot. She has no range of motion of the first metatarsophalangeal joint other than about 5 or 6. First metatarsophalangeal joint is large thick nonpulsatile rigid spurs. Radiograph confirms no fracture to the metatarsals however she does have severe osteoarthritis first metatarsophalangeal joint of the right foot. Cutaneous evaluation of Mr. supple well-hydrated cutis no erythema edema saline as drainage or odor.        Assessment & Plan:  Hallux rigidus osteoarthritis to degenerative joint disease first metatarsophalangeal joint right foot.  Plan: We discussed the etiology pathology  conservative versus surgical therapies. At this point we discussed surgical intervention regarding a joint replacement to the first metatarsophalangeal joint. We consented her today for a Keller arthroplasty with single silicone implant first metatarsophalangeal joint of the right foot. I answered all the questions regarding this procedure to the best of my ability in layman's terms. She understands this and is amenable to it and signed all 3 pages of the consent form. We did discuss the possible postop complications which may include but are not limited to postop pain bleeding swelling infection recurrence and need for further surgery overcorrection or under correction failure of the wound to heal possible need for amputation. She understands this is amenable to it signed all 3 pages of the consent form and we dispensed a Cam Walker that she will bring to surgery with her. We will request medical clearance from her primary provider prior to surgical intervention.

## 2016-02-17 NOTE — Patient Instructions (Signed)
Pre-Operative Instructions  Congratulations, you have decided to take an important step to improving your quality of life.  You can be assured that the doctors of [facility] will be with you every step of the way.  1. Plan to be at the surgery center/hospital at least 1 (one) hour prior to your scheduled time unless otherwise directed by the surgical center/hospital staff.  You must have a responsible adult accompany you, remain during the surgery and drive you home.  Make sure you have directions to the surgical center/hospital and know how to get there on time. 2. For hospital based surgery you will need to obtain a history and physical form from your family physician within 1 month prior to the date of surgery- we will give you a form for you primary physician.  3. We make every effort to accommodate the date you request for surgery.  There are however, times where surgery dates or times have to be moved.  We will contact you as soon as possible if a change in schedule is required.   4. No Aspirin/Ibuprofen for one week before surgery.  If you are on aspirin, any non-steroidal anti-inflammatory medications (Mobic, Aleve, Ibuprofen) you should stop taking it 7 days prior to your surgery.  You make take Tylenol  For pain prior to surgery.  5. Medications- If you are taking daily heart and blood pressure medications, seizure, reflux, allergy, asthma, anxiety, pain or diabetes medications, make sure the surgery center/hospital is aware before the day of surgery so they may notify you which medications to take or avoid the day of surgery. 6. No food or drink after midnight the night before surgery unless directed otherwise by surgical center/hospital staff. 7. No alcoholic beverages 24 hours prior to surgery.  No smoking 24 hours prior to or 24 hours after surgery. 8. Wear loose pants or shorts- loose enough to fit over bandages, boots, and casts. 9. No slip on shoes, sneakers are best. 10. Bring  your boot with you to the surgery center/hospital.  Also bring crutches or a walker if your physician has prescribed it for you.  If you do not have this equipment, it will be provided for you after surgery. 11. If you have not been contracted by the surgery center/hospital by the day before your surgery, call to confirm the date and time of your surgery. 12. Leave-time from work may vary depending on the type of surgery you have.  Appropriate arrangements should be made prior to surgery with your employer. 13. Prescriptions will be provided immediately following surgery by your doctor.  Have these filled as soon as possible after surgery and take the medication as directed. 14. Remove nail polish on the operative foot. 15. Wash the night before surgery.  The night before surgery wash the foot and leg well with the antibacterial soap provided and water paying special attention to beneath the toenails and in between the toes.  Rinse thoroughly with water and dry well with a towel.  Perform this wash unless told not to do so by your physician.  Enclosed: 1 Ice pack (please put in freezer the night before surgery)   1 Hibiclens skin cleaner   Pre-op Instructions  If you have any questions regarding the instructions, do not hesitate to call our office.  [place]: [address] [place], [place] [address] [phone]  [place]: [address] [place], [place] [address] [phone]  [place]: [address]  [place], [place] [address] [phone]  Dr. Richard   Tuchman DPM, Dr. Norman Regal DPM Dr. Richard Sikora DPM, Dr. M. Todd Jaquasha Carnevale DPM, Dr. Kathryn Egerton DPM 

## 2016-02-24 ENCOUNTER — Telehealth: Payer: Self-pay | Admitting: *Deleted

## 2016-02-24 ENCOUNTER — Encounter: Payer: Self-pay | Admitting: Primary Care

## 2016-02-24 ENCOUNTER — Encounter: Payer: Self-pay | Admitting: *Deleted

## 2016-02-24 NOTE — Telephone Encounter (Signed)
I faxed Medical Clearance request letter to [facility] for surgery scheduled for 03/12/2016.

## 2016-02-26 NOTE — Telephone Encounter (Signed)
[name], NP gave medical clearance for patient to have surgery.

## 2016-03-02 ENCOUNTER — Telehealth: Payer: Self-pay | Admitting: *Deleted

## 2016-03-02 NOTE — Telephone Encounter (Signed)
Authorization for surgery scheduled for 03./31/2017 for [facility] left foot was obtained.  Authorization number is [id number].  Authorization was faxed to [facility].

## 2016-03-05 ENCOUNTER — Ambulatory Visit (INDEPENDENT_AMBULATORY_CARE_PROVIDER_SITE_OTHER): Payer: Commercial Managed Care - HMO | Admitting: Primary Care

## 2016-03-05 ENCOUNTER — Encounter: Payer: Self-pay | Admitting: Primary Care

## 2016-03-05 ENCOUNTER — Ambulatory Visit (INDEPENDENT_AMBULATORY_CARE_PROVIDER_SITE_OTHER)
Admission: RE | Admit: 2016-03-05 | Discharge: 2016-03-05 | Disposition: A | Discharge status: Home / Self Care | Payer: Commercial Managed Care - HMO | Source: Ambulatory Visit | Attending: Primary Care | Admitting: Primary Care

## 2016-03-05 VITALS — BP 130/88 | HR 88 | Temp 97.9°F | Wt 245.8 lb

## 2016-03-05 DIAGNOSIS — G8929 Other chronic pain: Secondary | ICD-10-CM

## 2016-03-05 DIAGNOSIS — R609 Edema, unspecified: Secondary | ICD-10-CM | POA: Diagnosis not present

## 2016-03-05 DIAGNOSIS — R600 Localized edema: Secondary | ICD-10-CM | POA: Diagnosis not present

## 2016-03-05 DIAGNOSIS — M542 Cervicalgia: Secondary | ICD-10-CM | POA: Diagnosis not present

## 2016-03-05 DIAGNOSIS — R51 Headache: Secondary | ICD-10-CM

## 2016-03-05 DIAGNOSIS — R519 Headache, unspecified: Secondary | ICD-10-CM
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
## 2016-03-05 NOTE — Patient Instructions (Addendum)
Your ECG looks good.  Complete lab work prior to leaving today. I will notify you of your results once received.   Complete xray(s) prior to leaving today. I will notify you of your results once received.  I would like to start you on Lasix 20 mg tablets for swelling to your legs. We cannot start this until after your surgery. Please call me once you've recovered from your surgery so we can work to decrease the swelling to your legs.  Continue your current medications.   It was a pleasure to see you today!

## 2016-03-05 NOTE — Progress Notes (Signed)
Pre visit review using our clinic review tool, if applicable. No additional management support is needed unless otherwise documented below in the visit note. 

## 2016-03-06 DIAGNOSIS — R519 Headache, unspecified: Secondary | ICD-10-CM | POA: Insufficient documentation

## 2016-03-06 DIAGNOSIS — R51 Headache: Secondary | ICD-10-CM

## 2016-03-06 LAB — BASIC METABOLIC PANEL
BUN: 14 mg/dL (ref 7–25)
CO2: 27 mmol/L (ref 20–31)
Calcium: 8.7 mg/dL (ref 8.6–10.4)
Chloride: 107 mmol/L (ref 98–110)
Creat: 0.89 mg/dL (ref 0.50–0.99)
Glucose, Bld: 91 mg/dL (ref 65–99)
Potassium: 3.7 mmol/L (ref 3.5–5.3)
Sodium: 145 mmol/L (ref 135–146)
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
## 2016-03-06 NOTE — Assessment & Plan Note (Signed)
Patient endorses left lower extremity edema has only been present since June 2016 after receiving methylprednisolone. Exam with edema vs adipose tissue to bilateral lower extremities. No pitting edema. Echo from 09/2015 mostly unremarkable, doesn't seem to be cardiac related.  ECG repeated today and is without acute changes when compared to prior in September 2016. She will be undergoing foot surgery next week. Currently on HCTZ 12.5, will consider low dose Lasix after surgery to see if this helps to relieve swelling. Also needs to lose weight. Will continue to monitor.

## 2016-03-11 ENCOUNTER — Other Ambulatory Visit: Payer: Self-pay | Admitting: Podiatry

## 2016-03-11 MED ORDER — ONDANSETRON HCL 4 MG PO TABS: 4.0000 mg | ORAL_TABLET | Freq: Three times a day (TID) | ORAL | Status: DC | PRN

## 2016-03-11 MED ORDER — HYDROMORPHONE HCL 4 MG PO TABS: 4.0000 mg | ORAL_TABLET | ORAL | Status: DC | PRN

## 2016-03-11 MED ORDER — CLINDAMYCIN HCL 150 MG PO CAPS: 150.0000 mg | ORAL_CAPSULE | Freq: Three times a day (TID) | ORAL | Status: DC

## 2016-03-12 ENCOUNTER — Encounter: Payer: Self-pay | Admitting: Podiatry

## 2016-03-12 DIAGNOSIS — M2021 Hallux rigidus, right foot: Secondary | ICD-10-CM | POA: Diagnosis not present

## 2016-03-12 DIAGNOSIS — G4730 Sleep apnea, unspecified: Secondary | ICD-10-CM | POA: Diagnosis not present

## 2016-03-12 DIAGNOSIS — M205X1 Other deformities of toe(s) (acquired), right foot: Secondary | ICD-10-CM | POA: Diagnosis not present

## 2016-03-12 DIAGNOSIS — M25571 Pain in right ankle and joints of right foot: Secondary | ICD-10-CM | POA: Diagnosis not present

## 2016-03-13 ENCOUNTER — Encounter: Payer: Self-pay | Admitting: Podiatry

## 2016-03-13 NOTE — Progress Notes (Signed)
Patient ID: Ann Barker, female   DOB: 1951-01-20, 65 y.o.   MRN: [id number]  Patient called the call service asking if she needed to wear her boot from surgery all the time. I encouraged her to wear it all the time, even at night. She has no other questions. She states she is doing well from the surgery.

## 2016-03-17 ENCOUNTER — Telehealth: Payer: Self-pay | Admitting: *Deleted

## 2016-03-17 MED ORDER — DOXYCYCLINE HYCLATE 150 MG PO TABS: 1.0000 | ORAL_TABLET | Freq: Two times a day (BID) | ORAL | Status: DC

## 2016-03-17 NOTE — Telephone Encounter (Signed)
Dc clinda. Start doxycycline 150 bid x 10 day.

## 2016-03-17 NOTE — Telephone Encounter (Addendum)
Pt states the clindamycin is making her sick to her stomach and making her "boo-boo" a lot.  Pt states she is not having any pain, and started the Clindamycin 03/13/2016.  I told pt to stop the clindamycin and I would call again with further instructions from Dr. Milinda Pointer.  Orders to pharmacy and pt.

## 2016-03-18 ENCOUNTER — Ambulatory Visit (INDEPENDENT_AMBULATORY_CARE_PROVIDER_SITE_OTHER): Payer: Commercial Managed Care - HMO | Admitting: Podiatry

## 2016-03-18 ENCOUNTER — Ambulatory Visit (INDEPENDENT_AMBULATORY_CARE_PROVIDER_SITE_OTHER): Payer: Commercial Managed Care - HMO

## 2016-03-18 ENCOUNTER — Encounter: Payer: Self-pay | Admitting: Podiatry

## 2016-03-18 VITALS — BP 148/89 | HR 79 | Resp 12

## 2016-03-18 DIAGNOSIS — Z9889 Other specified postprocedural states: Secondary | ICD-10-CM

## 2016-03-18 DIAGNOSIS — M205X1 Other deformities of toe(s) (acquired), right foot: Secondary | ICD-10-CM

## 2016-03-18 NOTE — Progress Notes (Signed)
She presents today for her first postop visit status post Keller arthroplasty with a single silicone implant right foot. She states is still tender. She denies pain in the calf shortness of breath or chest pain.  Objective: Vital signs are stable alert and oriented 3. Pulses are palpable. Dry sterile dressing intact was removed demonstrates no erythema mild edema no cellulitis drainage or odor. Margins remaining well coapted she has a great range of motion of the first metatarsophalangeal joint though the white tender. Radiographic evaluation does demonstrate a very nicely placed Keller arthroplasty with a single silicone implant without grommets.  Assessment: Well-healing surgical foot status post one week.  Plan: Redressed the foot today with a dry sterile compressive dressing encouraged range of motion exercises. Placed her in a Darco shoe with a dry sterile compressive dressing. Follow up with [place] in 1-2 weeks.

## 2016-03-22 ENCOUNTER — Telehealth: Payer: Self-pay | Admitting: *Deleted

## 2016-03-22 MED ORDER — DOXYCYCLINE HYCLATE 100 MG PO TABS: 100.0000 mg | ORAL_TABLET | Freq: Two times a day (BID) | ORAL | Status: DC

## 2016-03-22 NOTE — Telephone Encounter (Signed)
Pt states the Doxycycline 150mg  is too expensive.  I spoke with the [facility] pharmacist and she said the Doxycycline 150mg  is not covered by pt's insurance and is very expensive.  I spoke with Dr. Jacqualyn Posey and he said change to Doxycyline 100mg  #20 one capsule bid, +1refill.  Informed pt of the change.

## 2016-03-25 ENCOUNTER — Telehealth: Payer: Self-pay | Admitting: *Deleted

## 2016-03-25 ENCOUNTER — Ambulatory Visit: Payer: Commercial Managed Care - HMO

## 2016-03-25 DIAGNOSIS — Z9889 Other specified postprocedural states: Secondary | ICD-10-CM

## 2016-03-25 DIAGNOSIS — M205X1 Other deformities of toe(s) (acquired), right foot: Secondary | ICD-10-CM

## 2016-03-25 NOTE — Telephone Encounter (Signed)
Pt states her dressing is sliding off, because her swelling has gone down.  I transferred pt to scheduler to come in today for a dressing change.

## 2016-03-25 NOTE — Progress Notes (Signed)
Patient ID: Ann Barker, female   DOB: 27-Jan-1951, 65 y.o.   MRN: [id number] Pt presents with difficulty dressing her post op foot. Old dressing removed. Surgical incision aligned and approximated, no drainage noted, afebrile and denies nausea, fever or chills. Mild redness and swelling of post operative foot, but WNL for this procedure and post operative state. She is to keep her f/u appt with Dr Milinda Pointer, call or go to the ED with any acute symptom changes

## 2016-03-30 ENCOUNTER — Ambulatory Visit (INDEPENDENT_AMBULATORY_CARE_PROVIDER_SITE_OTHER): Payer: Commercial Managed Care - HMO | Admitting: Podiatry

## 2016-03-30 ENCOUNTER — Encounter: Payer: Self-pay | Admitting: Podiatry

## 2016-03-30 VITALS — BP 149/78 | HR 69 | Resp 12

## 2016-03-30 DIAGNOSIS — Z9889 Other specified postprocedural states: Secondary | ICD-10-CM

## 2016-03-30 NOTE — Progress Notes (Signed)
She presents today for second postop visit status post Keller arthroplasty with a single silicone implant. She is not feeling very well today she's feeling nauseous sickle her stomach. She states that the foot feels much better than it did previously.  Objective: Vital signs are stable she is alert and oriented 3. Pulses are palpable. No calf pain. She has pain-free range of motion of the first metatarsophalangeal joint with active range of motion she's somewhat tender on passive range of motion. Margins are well coapted is no signs of infection.  Assessment: Well-healing surgical foot right.  Plan: I'm placing her back in a smaller Darco shoe today and I will follow-up with her in 2 weeks. I will allow her to get this wet and hopefully we will be able to get back into a regular shoe in 2 weeks. Another set of x-rays will be performed at that time

## 2016-04-06 NOTE — Progress Notes (Signed)
DOS 03/12/2106 Right Keller Arthroplasty with single Silicone Implant right.

## 2016-04-13 ENCOUNTER — Ambulatory Visit (INDEPENDENT_AMBULATORY_CARE_PROVIDER_SITE_OTHER): Payer: Commercial Managed Care - HMO

## 2016-04-13 ENCOUNTER — Encounter: Payer: Self-pay | Admitting: Podiatry

## 2016-04-13 ENCOUNTER — Ambulatory Visit (INDEPENDENT_AMBULATORY_CARE_PROVIDER_SITE_OTHER): Payer: Commercial Managed Care - HMO | Admitting: Podiatry

## 2016-04-13 VITALS — BP 151/85 | HR 90 | Resp 16

## 2016-04-13 DIAGNOSIS — M205X1 Other deformities of toe(s) (acquired), right foot: Secondary | ICD-10-CM

## 2016-04-13 DIAGNOSIS — Z9889 Other specified postprocedural states: Secondary | ICD-10-CM

## 2016-04-13 NOTE — Progress Notes (Signed)
She presents today for follow-up of her Ann Barker arthroplasty first metatarsophalangeal joint right foot date of surgery 03/12/2016. She states that her foot feels fine but she's had a lot of swelling. She states that her primary care provider cannot seem to get the swelling down and has placed her in compression devices bilaterally. She states that she has been to the pain doctor and does not have any blood clots or problems with her veins.  Objective: Vital signs stable alert and oriented 3. Pulses are palpable. She has considerable swelling bilateral lower extremity right greater than left surgical site has gone on to heal uneventfully and she has a full range of motion without crepitation or pain. The edema in her legs is noted bilaterally it appears to be tight and brawny with no pitting. There is no stasis and no ulceration or dermatitis. Radiographs taken today in the office demonstrate a well healing first metatarsophalangeal joint with good placement of the implant.  Assessment: Well-healing surgical foot right. But I'm most concerned about the bilateral edema or lymphedema.  Plan: I encouraged range of motion exercises. I also encouraged continued use of her compression hose. I think it may be necessary that she consider a lymphedema clinic. The only one that I am aware of his elements [facility]. Her primary provider may need to request this.

## 2016-04-14 ENCOUNTER — Telehealth: Payer: Self-pay

## 2016-04-14 ENCOUNTER — Other Ambulatory Visit: Payer: Self-pay | Admitting: Primary Care

## 2016-04-14 DIAGNOSIS — R600 Localized edema: Secondary | ICD-10-CM

## 2016-04-14 NOTE — Telephone Encounter (Signed)
Pt left v/m; pt saw podiatrist and was advised for swelling in legs and ankles should see lymphedema clinic at [facility]. Pt request cb.

## 2016-04-14 NOTE — Telephone Encounter (Signed)
Noted. Please notify patient that i have placed the referral.

## 2016-04-14 NOTE — Telephone Encounter (Signed)
Called and notified patient of Kate's comments. Patient verbalized understanding.  

## 2016-04-21 ENCOUNTER — Ambulatory Visit: Payer: Commercial Managed Care - HMO | Attending: Primary Care | Admitting: Occupational Therapy

## 2016-04-21 VITALS — Ht 65.0 in | Wt 240.0 lb

## 2016-04-21 DIAGNOSIS — I890 Lymphedema, not elsewhere classified: Secondary | ICD-10-CM | POA: Diagnosis not present

## 2016-04-21 NOTE — Patient Instructions (Signed)

## 2016-04-23 ENCOUNTER — Ambulatory Visit: Payer: Commercial Managed Care - HMO | Admitting: Occupational Therapy

## 2016-04-23 DIAGNOSIS — I890 Lymphedema, not elsewhere classified: Secondary | ICD-10-CM
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
## 2016-04-23 NOTE — Patient Instructions (Signed)
LE instructions and precautions as established- see initial eval.   
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
## 2016-04-26 ENCOUNTER — Ambulatory Visit: Payer: Commercial Managed Care - HMO | Admitting: Occupational Therapy

## 2016-04-26 DIAGNOSIS — I890 Lymphedema, not elsewhere classified: Secondary | ICD-10-CM | POA: Diagnosis not present

## 2016-04-26 NOTE — Patient Instructions (Signed)
LE instructions and precautions as established- see initial eval.   

## 2016-04-26 NOTE — Therapy (Signed)
[place] MAIN [facility] SERVICES [address] [place], [place], [address] Phone: [phone]   Fax:  [phone]  Occupational Therapy Treatment  Patient Details  Name: Ann Barker MRN: [id number] Date of Birth: 1951-12-06 No Data Recorded  Encounter Date: 04/26/2016      OT End of Session - 04/26/16 1724    Visit Number 3   Number of Visits 36   Date for OT Re-Evaluation 07/20/16   OT Start Time 0315   OT Stop Time 0411   OT Time Calculation (min) 56 min   Equipment Utilized During Treatment LE self care workbook   Activity Tolerance Patient tolerated treatment well;No increased pain   Behavior During Therapy [facility] for tasks assessed/performed      Past Medical History  Diagnosis Date  . Hypertension   . Hematuria     microscopic  . Lower extremity edema   . Anemia   . Arthritis   . Acid reflux   . UTI (lower urinary tract infection)   . Hoarseness   . HLD (hyperlipidemia)   . Vitamin D deficiency   . Dizziness   . Urge incontinence   . Renal cyst   . Gallstones   . Over weight     Past Surgical History  Procedure Laterality Date  . Abdominal hysterectomy  1981  . Hernia repair  2005  . Breast biopsy  1995/2005  . Tubal ligation  1975    There were no vitals filed for this visit.      Subjective Assessment - 04/26/16 1525    Subjective  (p) Pt presents for OT visit 2 to address BLE lymphedema. Pt has no new complaints since last seenm on Weds, May 10.Emphasis of treatment visit today is on skilled Pt edu for lymphedema self care.   Pertinent History (p) 2014 & 2015 B Laser vein ablation; Keller arthroplasty R 1st metatarsophalangeal joint 03/12/16. Reported hx of serious L groin infection and cellulitis s/p hysterectomy several years ago; BLE vein ablation several years ago by report;;HTN   Limitations (p) difficulty walking, difficulty w/ extended standing and sitting, difficulty fitting LB clothing and street shoes    Patient Stated Goals (p) reduce swelling and leg pain to be able to do more   Pain Onset (p) More than a month ago                      OT Treatments/Exercises (OP) - 04/26/16 0001    ADLs   ADL Education Given Yes   Manual Therapy   Manual Therapy Edema management;Compression Bandaging   Compression Bandaging LLE gradient compression wraps applied from toes to below knee: toe wrap x1 under cotton stockinett; 8 cm x 5 m x 1 to foot and ankle, 10 cm  x 5 m x 2, 12 cm x5 cm x 1, applied circumferentially in custommary layered gradient configuration  over .04 x 10 cm x 5 m Rosidol Soft x 1.5 .rolls.  Will modify to A-G PRN                OT Education - 04/26/16 1723    Education provided Yes   Education Details Emphasis again today on proper self application of knee length gradient compression wrapping. Pt able to apply all layers using proper techniqyues w/ max assist by end of session. she will practive during visit interval.   Person(s) Educated Patient   Methods Explanation;Demonstration;Tactile cues;Verbal cues;Handout   Comprehension Verbalized  understanding;Returned demonstration;Verbal cues required;Tactile cues required;Need further instruction             OT Long Term Goals - 04/23/16 1617    OT LONG TERM GOAL #1   Title Lymphedema (LE) self-care: Pt able to apply multi layered, gradient compression wraps independently using proper techniques within 2 weeks to achieve optimal limb volume reduction.   Baseline dependent   Time 2   Period Weeks   Status New   OT LONG TERM GOAL #2   Title Lymphedema (LE) self-care:  Pt to achieve at least 10% BLE limb volume reductions during Intensive CDT to limit LE progression, decrease infection risk, to reduce pain/ discomfort, and to improve safe ambulation and functional mobility.   Baseline dependent   Time 12   Period Weeks   Status New   OT LONG TERM GOAL #3   Title Pt >/= 85 % compliant with all daily,  LE self-care protocols for home program, including simple self-manual lymphatic drainage (MLD), skin care, lymphatic pumping the ex, skin care, and donning/ doffing compression wraps and garments using assistive devices PRN (modified independence)  to limit LE progression and further functional decline.     Baseline dependent   Time 12   Period Weeks   Status New   OT LONG TERM GOAL #4   Title Pt to tolerate daily compression wraps, garments and devices in keeping w/ prescribed wear regime within 1 week of issue date to progress and retain clinical and functional gains and to limit LE progression.   Baseline dependent   Time 12   Period Weeks   Status New   OT LONG TERM GOAL #5   Title During Management Phase CDT Pt to sustain limb volume reductions achieved during Intensive Phase CDT within 5% utilizing LE self-care protocols, appropriate compression garments/ devices, and needed level of caregiver assistance.   Baseline dependent   Time 6   Period Months   Status New               Plan - 04/26/16 1724    Clinical Impression Statement Pt needed max A w/ verbal and manual cues for wrap application. She agrees to practice during interval, and I believe she will master these skills next week. We;ll continue to emphasize Pt edu ongoing until mastered. We'll commence MLD next weel. Limb volume appears slightly decreased below L knee today.   Rehab Potential Good   OT Frequency 3x / week   OT Duration 12 weeks   OT Treatment/Interventions DME and/or AE instruction;Manual lymph drainage;Patient/family education;Compression bandaging;Therapeutic exercises;Therapeutic activities;Other (comment);Manual Therapy;Self-care/ADL training  skin care   OT Home Exercise Plan such as [employer], to facilitate improved lymphatic and vascular function , and to limit fibrosis formation during HOS   Consulted and Agree with Plan of Care Patient      Patient will benefit from skilled therapeutic  intervention in order to improve the following deficits and impairments:  Decreased knowledge of use of DME, Decreased skin integrity, Increased edema, Impaired flexibility, Pain, Decreased mobility, Decreased range of motion, Difficulty walking, Abnormal gait  Visit Diagnosis: Lymphedema, not elsewhere classified    Problem List Patient Active Problem List   Diagnosis Date Noted  . Frequent headaches 03/06/2016  . Microscopic hematuria 09/16/2015  . Bilateral renal cysts 09/16/2015  . Urinary frequency 09/16/2015  . Nocturia 09/16/2015  . Bilateral edema of lower extremity 09/09/2015  . Essential hypertension 09/09/2015  . Anemia 09/09/2015  . History of hematuria  09/09/2015    Andrey Spearman, MS, OTR/L, [facility] 04/26/2016 5:27 PM  [place] MAIN [facility] SERVICES [address] [place], [place], [address] Phone: [phone]   Fax:  [phone]  Name: Ann Barker MRN: [id number] Date of Birth: [birth year]

## 2016-04-28 ENCOUNTER — Ambulatory Visit: Payer: Commercial Managed Care - HMO | Admitting: Occupational Therapy

## 2016-04-28 DIAGNOSIS — I890 Lymphedema, not elsewhere classified: Secondary | ICD-10-CM | POA: Diagnosis not present

## 2016-04-28 NOTE — Therapy (Signed)
[place] MAIN [facility] SERVICES [address] [place], [place], [address] Phone: [phone]   Fax:  [phone]  Occupational Therapy Treatment  Patient Details  Name: Ann Barker MRN: [id number] Date of Birth: [birth year] No Data Recorded  Encounter Date: 04/28/2016      OT End of Session - 04/28/16 1643    Visit Number 4   Number of Visits 36   Date for OT Re-Evaluation 07/20/16   OT Start Time 0313   OT Stop Time 0414   OT Time Calculation (min) 61 min   Equipment Utilized During Treatment LE self care workbook   Activity Tolerance Patient tolerated treatment well;No increased pain   Behavior During Therapy [facility] for tasks assessed/performed      Past Medical History  Diagnosis Date  . Hypertension   . Hematuria     microscopic  . Lower extremity edema   . Anemia   . Arthritis   . Acid reflux   . UTI (lower urinary tract infection)   . Hoarseness   . HLD (hyperlipidemia)   . Vitamin D deficiency   . Dizziness   . Urge incontinence   . Renal cyst   . Gallstones   . Over weight     Past Surgical History  Procedure Laterality Date  . Abdominal hysterectomy  1981  . Hernia repair  2005  . Breast biopsy  1995/2005  . Tubal ligation  1975    There were no vitals filed for this visit.      Subjective Assessment - 04/28/16 1514    Subjective  Pt presents for OT visit 4 to address BLE lymphedema. Pt reports she practiced wrapping during visit interval, but feels she needs more practice.   Pertinent History 2014 & 2015 B Laser vein ablation; Keller arthroplasty R 1st metatarsophalangeal joint 03/12/16. Reported hx of serious L groin infection and cellulitis s/p hysterectomy several years ago; BLE vein ablation several years ago by report;;HTN   Limitations difficulty walking, difficulty w/ extended standing and sitting, difficulty fitting LB clothing and street shoes   Patient Stated Goals reduce swelling and leg pain to be  able to do more   Currently in Pain? No/denies   Pain Onset More than a month ago                      OT Treatments/Exercises (OP) - 04/28/16 0001    ADLs   ADL Education Given Yes   Manual Therapy   Manual Therapy Edema management;Compression Bandaging;Manual Lymphatic Drainage (MLD)   Edema Management Skin care w/  skin grade, low pH castor oil   Manual Lymphatic Drainage (MLD) Manual lymph drainage (MLD) to LLE in supine utilizing functional inguinal lymph nodes and deep abdominal lymphatics as is customary for non-cancer related lower extremity LE, including bilateral "short neck" sequence, J strokes to sub and supraclavicular LN, deep abdominal pathways, functional inguinal LN, lower extremity proximal to distal w/ emphasis on medial knee bottleneck and politeal LN. Performed fibrosis technique to B maleoli and distal  legs to address fatty fibrosis. Good tolerance.   Compression Bandaging LLE gradient compression wraps applied from toes to below knee: toe wrap x1 under cotton stockinett; 8 cm x 5 m x 1 to foot and ankle, 10 cm  x 5 m x 2, 12 cm x5 cm x 1, applied circumferentially in custommary layered gradient configuration  over .04 x 10 cm x 5 m Rosidol Soft x 1.5 .  rolls.  Will modify to A-G PRN                OT Education - 04/28/16 1642    Education provided Yes   Education Details Emphasis of skilled OT education on LE self care for simple self MLD. Introduced all sequences and J stroke today.   Person(s) Educated Patient   Methods Explanation;Handout   Comprehension Verbalized understanding;Need further instruction             OT Long Term Goals - 04/23/16 1617    OT LONG TERM GOAL #1   Title Lymphedema (LE) self-care: Pt able to apply multi layered, gradient compression wraps independently using proper techniques within 2 weeks to achieve optimal limb volume reduction.   Baseline dependent   Time 2   Period Weeks   Status New   OT LONG TERM  GOAL #2   Title Lymphedema (LE) self-care:  Pt to achieve at least 10% BLE limb volume reductions during Intensive CDT to limit LE progression, decrease infection risk, to reduce pain/ discomfort, and to improve safe ambulation and functional mobility.   Baseline dependent   Time 12   Period Weeks   Status New   OT LONG TERM GOAL #3   Title Pt >/= 85 % compliant with all daily, LE self-care protocols for home program, including simple self-manual lymphatic drainage (MLD), skin care, lymphatic pumping the ex, skin care, and donning/ doffing compression wraps and garments using assistive devices PRN (modified independence)  to limit LE progression and further functional decline.     Baseline dependent   Time 12   Period Weeks   Status New   OT LONG TERM GOAL #4   Title Pt to tolerate daily compression wraps, garments and devices in keeping w/ prescribed wear regime within 1 week of issue date to progress and retain clinical and functional gains and to limit LE progression.   Baseline dependent   Time 12   Period Weeks   Status New   OT LONG TERM GOAL #5   Title During Management Phase CDT Pt to sustain limb volume reductions achieved during Intensive Phase CDT within 5% utilizing LE self-care protocols, appropriate compression garments/ devices, and needed level of caregiver assistance.   Baseline dependent   Time 6   Period Months   Status New               Plan - 04/28/16 1644    Clinical Impression Statement Rx leg is mildly decreased in volume by visual assessment today. Pt tolerated wraps for 23 hours after last visit and attempted to reaply wraps without success. Commenced MLD today, they continued Pt edu for compression  wrapping. Cont as per POC.   Rehab Potential Good   OT Frequency 3x / week   OT Duration 12 weeks   OT Treatment/Interventions DME and/or AE instruction;Manual lymph drainage;Patient/family education;Compression bandaging;Therapeutic exercises;Therapeutic  activities;Other (comment);Manual Therapy;Self-care/ADL training  skin care   OT Home Exercise Plan such as [employer], to facilitate improved lymphatic and vascular function , and to limit fibrosis formation during HOS   Consulted and Agree with Plan of Care Patient      Patient will benefit from skilled therapeutic intervention in order to improve the following deficits and impairments:  Decreased knowledge of use of DME, Decreased skin integrity, Increased edema, Impaired flexibility, Pain, Decreased mobility, Decreased range of motion, Difficulty walking, Abnormal gait  Visit Diagnosis: Lymphedema, not elsewhere classified    Problem List  Patient Active Problem List   Diagnosis Date Noted  . Frequent headaches 03/06/2016  . Microscopic hematuria 09/16/2015  . Bilateral renal cysts 09/16/2015  . Urinary frequency 09/16/2015  . Nocturia 09/16/2015  . Bilateral edema of lower extremity 09/09/2015  . Essential hypertension 09/09/2015  . Anemia 09/09/2015  . History of hematuria 09/09/2015    Andrey Spearman, MS, OTR/L, [facility] 04/28/2016 4:47 PM  [place] MAIN [facility] SERVICES [address] [place], [place], [address] Phone: [phone]   Fax:  [phone]  Name: CADDIE TORRENCE MRN: [id number] Date of Birth: [birth year]

## 2016-04-28 NOTE — Patient Instructions (Signed)
LE instructions and precautions as established- see initial eval.   

## 2016-04-30 ENCOUNTER — Ambulatory Visit: Payer: Commercial Managed Care - HMO | Admitting: Occupational Therapy

## 2016-04-30 DIAGNOSIS — I890 Lymphedema, not elsewhere classified: Secondary | ICD-10-CM

## 2016-04-30 NOTE — Patient Instructions (Signed)
LE instructions and precautions as established- see initial eval.   

## 2016-04-30 NOTE — Therapy (Signed)
[place] MAIN [facility] SERVICES [address] [place], [place], [address] Phone: [phone]   Fax:  [phone]  Occupational Therapy Treatment  Patient Details  Name: Ann Barker MRN: [id number] Date of Birth: 03-Apr-1951 No Data Recorded  Encounter Date: 04/30/2016      OT End of Session - 04/30/16 1156    Visit Number 5   Number of Visits 36   Date for OT Re-Evaluation 07/20/16   OT Start Time 1040   OT Stop Time 1151   OT Time Calculation (min) 71 min   Equipment Utilized During Treatment LE self care workbook   Activity Tolerance Patient tolerated treatment well;No increased pain   Behavior During Therapy [facility] for tasks assessed/performed      Past Medical History  Diagnosis Date  . Hypertension   . Hematuria     microscopic  . Lower extremity edema   . Anemia   . Arthritis   . Acid reflux   . UTI (lower urinary tract infection)   . Hoarseness   . HLD (hyperlipidemia)   . Vitamin D deficiency   . Dizziness   . Urge incontinence   . Renal cyst   . Gallstones   . Over weight     Past Surgical History  Procedure Laterality Date  . Abdominal hysterectomy  1981  . Hernia repair  2005  . Breast biopsy  1995/2005  . Tubal ligation  1975    There were no vitals filed for this visit.      Subjective Assessment - 04/30/16 1041    Subjective  Pt presents for OT visit 4 to address BLE lymphedema. Pt 30 minutes late for 1 hour appointment due to emergency at home. Appointment extended to ensure full hour of Rx.   Pertinent History 2014 & 2015 B Laser vein ablation; Keller arthroplasty R 1st metatarsophalangeal joint 03/12/16. Reported hx of serious L groin infection and cellulitis s/p hysterectomy several years ago; BLE vein ablation several years ago by report;;HTN   Limitations difficulty walking, difficulty w/ extended standing and sitting, difficulty fitting LB clothing and street shoes   Patient Stated Goals reduce  swelling and leg pain to be able to do more   Currently in Pain? No/denies   Pain Onset More than a month ago                      OT Treatments/Exercises (OP) - 04/30/16 0001    Manual Therapy   Manual Therapy Edema management;Compression Bandaging;Manual Lymphatic Drainage (MLD)   Edema Management Skin care w/  skin grade, low pH castor oil   Manual Lymphatic Drainage (MLD) Manual lymph drainage (MLD) to LLE in supine utilizing functional inguinal lymph nodes and deep abdominal lymphatics as is customary for non-cancer related lower extremity LE, including bilateral "short neck" sequence, J strokes to sub and supraclavicular LN, deep abdominal pathways, functional inguinal LN, lower extremity proximal to distal w/ emphasis on medial knee bottleneck and politeal LN. Performed fibrosis technique to B maleoli and distal  legs to address fatty fibrosis. Good tolerance.   Compression Bandaging LLE gradient compression wraps applied from toes to below knee: toe wrap x1 under cotton stockinett; 8 cm x 5 m x 1 to foot and ankle, 10 cm  x 5 m x 2, 12 cm x5 cm x 1, applied circumferentially in custommary layered gradient configuration  over .04 x 10 cm x 5 m Rosidol Soft x 1.5 .rolls.  Will modify to A-G PRN                OT Education - 04/30/16 1155    Education provided Yes   Education Details Emphasis of skilled LE self-care training today on  simple self-MLD. Pt has mastered J stroke. She completed entire leg sequence with max assist and cues. Technique will improve with practice.   Person(s) Educated Patient   Methods Explanation;Demonstration;Tactile cues;Verbal cues;Handout   Comprehension Verbalized understanding;Returned demonstration;Verbal cues required;Tactile cues required;Need further instruction             OT Long Term Goals - 04/23/16 1617    OT LONG TERM GOAL #1   Title Lymphedema (LE) self-care: Pt able to apply multi layered, gradient compression  wraps independently using proper techniques within 2 weeks to achieve optimal limb volume reduction.   Baseline dependent   Time 2   Period Weeks   Status New   OT LONG TERM GOAL #2   Title Lymphedema (LE) self-care:  Pt to achieve at least 10% BLE limb volume reductions during Intensive CDT to limit LE progression, decrease infection risk, to reduce pain/ discomfort, and to improve safe ambulation and functional mobility.   Baseline dependent   Time 12   Period Weeks   Status New   OT LONG TERM GOAL #3   Title Pt >/= 85 % compliant with all daily, LE self-care protocols for home program, including simple self-manual lymphatic drainage (MLD), skin care, lymphatic pumping the ex, skin care, and donning/ doffing compression wraps and garments using assistive devices PRN (modified independence)  to limit LE progression and further functional decline.     Baseline dependent   Time 12   Period Weeks   Status New   OT LONG TERM GOAL #4   Title Pt to tolerate daily compression wraps, garments and devices in keeping w/ prescribed wear regime within 1 week of issue date to progress and retain clinical and functional gains and to limit LE progression.   Baseline dependent   Time 12   Period Weeks   Status New   OT LONG TERM GOAL #5   Title During Management Phase CDT Pt to sustain limb volume reductions achieved during Intensive Phase CDT within 5% utilizing LE self-care protocols, appropriate compression garments/ devices, and needed level of caregiver assistance.   Baseline dependent   Time 6   Period Months   Status New               Plan - 04/30/16 1156    Clinical Impression Statement Spent several minutes discussing etiology of fibrosis formation in B ankles during MLD. Demonstrated fibrosis techniques and showed Pt how to utilize a tennis ball for deeper massage to save finger joints. By end of session Pt  mastered J stroke and leg sequence. Provided resource for castor oil and  Eucerin lotion. Slight palpable softening noted at [facility].   Rehab Potential Good   OT Frequency 3x / week   OT Duration 12 weeks   OT Treatment/Interventions DME and/or AE instruction;Manual lymph drainage;Patient/family education;Compression bandaging;Therapeutic exercises;Therapeutic activities;Other (comment);Manual Therapy;Self-care/ADL training  skin care   OT Home Exercise Plan such as [employer], to facilitate improved lymphatic and vascular function , and to limit fibrosis formation during HOS   Consulted and Agree with Plan of Care Patient      Patient will benefit from skilled therapeutic intervention in order to improve the following deficits and impairments:  Decreased knowledge of use  of DME, Decreased skin integrity, Increased edema, Impaired flexibility, Pain, Decreased mobility, Decreased range of motion, Difficulty walking, Abnormal gait  Visit Diagnosis: Lymphedema, not elsewhere classified    Problem List Patient Active Problem List   Diagnosis Date Noted  . Frequent headaches 03/06/2016  . Microscopic hematuria 09/16/2015  . Bilateral renal cysts 09/16/2015  . Urinary frequency 09/16/2015  . Nocturia 09/16/2015  . Bilateral edema of lower extremity 09/09/2015  . Essential hypertension 09/09/2015  . Anemia 09/09/2015  . History of hematuria 09/09/2015   Andrey Spearman, MS, OTR/L, [facility] 04/30/2016 12:00 PM  [place] MAIN [facility] SERVICES [address] [place], [place], [address] Phone: [phone]   Fax:  [phone]  Name: JENAVEVE SKYBERG MRN: [id number] Date of Birth: 05/22/1951

## 2016-05-03 ENCOUNTER — Ambulatory Visit: Payer: Commercial Managed Care - HMO | Admitting: Occupational Therapy

## 2016-05-05 ENCOUNTER — Ambulatory Visit: Payer: Commercial Managed Care - HMO | Admitting: Occupational Therapy

## 2016-05-05 DIAGNOSIS — I890 Lymphedema, not elsewhere classified: Secondary | ICD-10-CM | POA: Diagnosis not present

## 2016-05-05 NOTE — Therapy (Signed)
[place] MAIN [facility] SERVICES [address] [place], [place], [address] Phone: [phone]   Fax:  [phone]  Occupational Therapy Treatment  Patient Details  Name: MARCIA MAROTZ MRN: [id number] Date of Birth: 29-Aug-1951 No Data Recorded  Encounter Date: 05/05/2016      OT End of Session - 05/05/16 1632    Visit Number 6   Number of Visits 36   Date for OT Re-Evaluation 07/20/16   OT Start Time 0313   OT Stop Time 0415   OT Time Calculation (min) 62 min   Equipment Utilized During Treatment LE self care workbook   Activity Tolerance Patient tolerated treatment well;No increased pain   Behavior During Therapy [facility] for tasks assessed/performed      Past Medical History  Diagnosis Date  . Hypertension   . Hematuria     microscopic  . Lower extremity edema   . Anemia   . Arthritis   . Acid reflux   . UTI (lower urinary tract infection)   . Hoarseness   . HLD (hyperlipidemia)   . Vitamin D deficiency   . Dizziness   . Urge incontinence   . Renal cyst   . Gallstones   . Over weight     Past Surgical History  Procedure Laterality Date  . Abdominal hysterectomy  1981  . Hernia repair  2005  . Breast biopsy  1995/2005  . Tubal ligation  1975    There were no vitals filed for this visit.      Subjective Assessment - 05/05/16 1524    Subjective  Pt presents for OT visit 6 to address BLE lymphedema. Pt reports that she was able to wrap all weekend.   Pertinent History 2014 & 2015 B Laser vein ablation; Keller arthroplasty R 1st metatarsophalangeal joint 03/12/16. Reported hx of serious L groin infection and cellulitis s/p hysterectomy several years ago; BLE vein ablation several years ago by report;;HTN   Limitations difficulty walking, difficulty w/ extended standing and sitting, difficulty fitting LB clothing and street shoes   Patient Stated Goals reduce swelling and leg pain to be able to do more   Currently in Pain?  No/denies   Pain Onset More than a month ago                      OT Treatments/Exercises (OP) - 05/05/16 0001    ADLs   ADL Education Given Yes   Manual Therapy   Manual Therapy Edema management;Compression Bandaging;Manual Lymphatic Drainage (MLD)   Edema Management Skin care w/  skin grade, low pH castor oil   Manual Lymphatic Drainage (MLD) Manual lymph drainage (MLD) to LLE in supine utilizing functional inguinal lymph nodes and deep abdominal lymphatics as is customary for non-cancer related lower extremity LE, including bilateral "short neck" sequence, J strokes to sub and supraclavicular LN, deep abdominal pathways, functional inguinal LN, lower extremity proximal to distal w/ emphasis on medial knee bottleneck and politeal LN. Performed fibrosis technique to B maleoli and distal  legs to address fatty fibrosis. Good tolerance.   Compression Bandaging Added medial and lateral maleoli pads custom  fabricated from [employer] dot grid padding.  LLE gradient compression wraps applied from toes to below knee: toe wrap x1 under cotton stockinett; 8 cm x 5 m x 1 to foot and ankle, 10 cm  x 5 m x 2, 12 cm x5 cm x 1, applied circumferentially in custommary layered gradient configuration  over .  04 x 10 cm x 5 m Rosidol Soft x 1.5 .rolls.  Will modify to A-G PRN                OT Education - 05/05/16 1631    Education provided Yes   Education Details Continued skilled Pt/caregiver Education  And LE ADL training throughout visit for lymphedema self care, including compression wrapping, compression garment and device wear/care, lymphatic pumping ther ex, simple self-MLD, and skin care. Discussed progress towards goals.   Person(s) Educated Patient   Methods Explanation;Demonstration   Comprehension Verbalized understanding;Need further instruction             OT Long Term Goals - 04/23/16 1617    OT LONG TERM GOAL #1   Title Lymphedema (LE) self-care: Pt able to apply multi  layered, gradient compression wraps independently using proper techniques within 2 weeks to achieve optimal limb volume reduction.   Baseline dependent   Time 2   Period Weeks   Status New   OT LONG TERM GOAL #2   Title Lymphedema (LE) self-care:  Pt to achieve at least 10% BLE limb volume reductions during Intensive CDT to limit LE progression, decrease infection risk, to reduce pain/ discomfort, and to improve safe ambulation and functional mobility.   Baseline dependent   Time 12   Period Weeks   Status New   OT LONG TERM GOAL #3   Title Pt >/= 85 % compliant with all daily, LE self-care protocols for home program, including simple self-manual lymphatic drainage (MLD), skin care, lymphatic pumping the ex, skin care, and donning/ doffing compression wraps and garments using assistive devices PRN (modified independence)  to limit LE progression and further functional decline.     Baseline dependent   Time 12   Period Weeks   Status New   OT LONG TERM GOAL #4   Title Pt to tolerate daily compression wraps, garments and devices in keeping w/ prescribed wear regime within 1 week of issue date to progress and retain clinical and functional gains and to limit LE progression.   Baseline dependent   Time 12   Period Weeks   Status New   OT LONG TERM GOAL #5   Title During Management Phase CDT Pt to sustain limb volume reductions achieved during Intensive Phase CDT within 5% utilizing LE self-care protocols, appropriate compression garments/ devices, and needed level of caregiver assistance.   Baseline dependent   Time 6   Period Months   Status New               Plan - 05/05/16 1636    Clinical Impression Statement Pt presents with visible swelling reduction in LLE below the knee and softening dense fibrosis at malleoli since last visit. Pt is refining compression wrapping skills and is more compliant w/ 23/7 regime between visits. Pt pleased w/ progress thus far. She agrees w/ plan  to complete comparative limb volumetrics next session to measure progress towards goals.   Rehab Potential Good   OT Frequency 3x / week   OT Duration 12 weeks   OT Treatment/Interventions DME and/or AE instruction;Manual lymph drainage;Patient/family education;Compression bandaging;Therapeutic exercises;Therapeutic activities;Other (comment);Manual Therapy;Self-care/ADL training  skin care   OT Home Exercise Plan such as [employer], to facilitate improved lymphatic and vascular function , and to limit fibrosis formation during HOS   Consulted and Agree with Plan of Care Patient      Patient will benefit from skilled therapeutic intervention in order to improve  the following deficits and impairments:  Decreased knowledge of use of DME, Decreased skin integrity, Increased edema, Impaired flexibility, Pain, Decreased mobility, Decreased range of motion, Difficulty walking, Abnormal gait  Visit Diagnosis: Lymphedema, not elsewhere classified    Problem List Patient Active Problem List   Diagnosis Date Noted  . Frequent headaches 03/06/2016  . Microscopic hematuria 09/16/2015  . Bilateral renal cysts 09/16/2015  . Urinary frequency 09/16/2015  . Nocturia 09/16/2015  . Bilateral edema of lower extremity 09/09/2015  . Essential hypertension 09/09/2015  . Anemia 09/09/2015  . History of hematuria 09/09/2015    Andrey Spearman, MS, OTR/L, [facility] 05/05/2016 4:39 PM  [place] MAIN [facility] SERVICES [address] [place], [place], [address] Phone: [phone]   Fax:  [phone]  Name: KEILYNN DECOUX MRN: [id number] Date of Birth: [birth year]

## 2016-05-05 NOTE — Patient Instructions (Signed)
LE instructions and precautions as established- see initial eval.   

## 2016-05-07 ENCOUNTER — Ambulatory Visit: Payer: Commercial Managed Care - HMO | Admitting: Occupational Therapy

## 2016-05-07 DIAGNOSIS — I890 Lymphedema, not elsewhere classified: Secondary | ICD-10-CM

## 2016-05-07 NOTE — Therapy (Signed)
[place] MAIN [facility] SERVICES [address] [place], [place], [address] Phone: [phone]   Fax:  [phone]  Occupational Therapy Treatment  Patient Details  Name: Ann Barker MRN: [id number] Date of Birth: Nov 24, 1951 No Data Recorded  Encounter Date: 05/07/2016      OT End of Session - 05/07/16 1244    Visit Number 7   Number of Visits 36   Date for OT Re-Evaluation 07/20/16   OT Start Time 1117   OT Stop Time 1244   OT Time Calculation (min) 87 min   Equipment Utilized During Treatment LE self care workbook   Activity Tolerance Patient tolerated treatment well;No increased pain   Behavior During Therapy [facility] for tasks assessed/performed      Past Medical History  Diagnosis Date  . Hypertension   . Hematuria     microscopic  . Lower extremity edema   . Anemia   . Arthritis   . Acid reflux   . UTI (lower urinary tract infection)   . Hoarseness   . HLD (hyperlipidemia)   . Vitamin D deficiency   . Dizziness   . Urge incontinence   . Renal cyst   . Gallstones   . Over weight     Past Surgical History  Procedure Laterality Date  . Abdominal hysterectomy  1981  . Hernia repair  2005  . Breast biopsy  1995/2005  . Tubal ligation  1975    There were no vitals filed for this visit.      Subjective Assessment - 05/07/16 1131    Subjective  Pt presents for OT visit 7 to address BLE lymphedema. Pt has no new complaints.   Pertinent History 2014 & 2015 B Laser vein ablation; Keller arthroplasty R 1st metatarsophalangeal joint 03/12/16. Reported hx of serious L groin infection and cellulitis s/p hysterectomy several years ago; BLE vein ablation several years ago by report;;HTN   Limitations difficulty walking, difficulty w/ extended standing and sitting, difficulty fitting LB clothing and street shoes   Patient Stated Goals reduce swelling and leg pain to be able to do more   Currently in Pain? Yes   Pain Score 0-No pain   not rated   Pain Location Knee   Pain Orientation Right   Pain Onset More than a month ago             LYMPHEDEMA/ONCOLOGY QUESTIONNAIRE - 05/07/16 1238    Left Lower Extremity Lymphedema   Other LLE A-D volume = 4968.80ml; A-G volume = 11002.17 ml. AD volume is decreased by 6.35%. A-G volume is decreased by 0.51% since commencing OT for CDT on 04/23/16.    Other LLE A-D LVD: 2.62%, L>R                  OT Treatments/Exercises (OP) - 05/07/16 0001    ADLs   ADL Education Given Yes   Manual Therapy   Manual Therapy Edema management;Compression Bandaging;Manual Lymphatic Drainage (MLD)   Manual therapy comments Completed  comparative LLE volumetric measurements   Edema Management Skin care w/  skin grade, low pH castor oil   Manual Lymphatic Drainage (MLD) Manual lymph drainage (MLD) to LLE in supine utilizing functional inguinal lymph nodes and deep abdominal lymphatics as is customary for non-cancer related lower extremity LE, including bilateral "short neck" sequence, J strokes to sub and supraclavicular LN, deep abdominal pathways, functional inguinal LN, lower extremity proximal to distal w/ emphasis on medial knee bottleneck and politeal  LN. Performed fibrosis technique to B maleoli and distal  legs to address fatty fibrosis. Good tolerance.   Compression Bandaging Modified medial and lateral maleoli pads custom  fabricated from Med[employer] dot grid padding by adding single layer of gray foam on outer surfaces.  LLE gradient compression wraps applied from toes to below knee: toe wrap x1 under cotton stockinett; 8 cm x 5 m x 1 to foot and ankle, 10 cm  x 5 m x 2, 12 cm x5 cm x 1, applied circumferentially in custommary layered gradient configuration  over .04 x 10 cm x 5 m Rosidol Soft x 1.5 .rolls.  Will modify to A-G PRN                OT Education - 05/07/16 1142    Education provided Yes   Education Details Pt >/= 85 % compliant with all daily, LE self-care  protocols for home program, including simple self-manual lymphatic drainage (MLD), skin care, lymphatic pumping the ex, skin care, and donning/ doffing compression wraps and garments with needed level of caregiver assistance to limit LE progression and further functional decline.     Person(s) Educated Patient   Methods Explanation   Comprehension Verbalized understanding;Need further instruction             OT Long Term Goals - 04/23/16 1617    OT LONG TERM GOAL #1   Title Lymphedema (LE) self-care: Pt able to apply multi layered, gradient compression wraps independently using proper techniques within 2 weeks to achieve optimal limb volume reduction.   Baseline dependent   Time 2   Period Weeks   Status New   OT LONG TERM GOAL #2   Title Lymphedema (LE) self-care:  Pt to achieve at least 10% BLE limb volume reductions during Intensive CDT to limit LE progression, decrease infection risk, to reduce pain/ discomfort, and to improve safe ambulation and functional mobility.   Baseline dependent   Time 12   Period Weeks   Status New   OT LONG TERM GOAL #3   Title Pt >/= 85 % compliant with all daily, LE self-care protocols for home program, including simple self-manual lymphatic drainage (MLD), skin care, lymphatic pumping the ex, skin care, and donning/ doffing compression wraps and garments using assistive devices PRN (modified independence)  to limit LE progression and further functional decline.     Baseline dependent   Time 12   Period Weeks   Status New   OT LONG TERM GOAL #4   Title Pt to tolerate daily compression wraps, garments and devices in keeping w/ prescribed wear regime within 1 week of issue date to progress and retain clinical and functional gains and to limit LE progression.   Baseline dependent   Time 12   Period Weeks   Status New   OT LONG TERM GOAL #5   Title During Management Phase CDT Pt to sustain limb volume reductions achieved during Intensive Phase CDT  within 5% utilizing LE self-care protocols, appropriate compression garments/ devices, and needed level of caregiver assistance.   Baseline dependent   Time 6   Period Months   Status New               Plan - 05/07/16 1245    Clinical Impression Statement LLE Comparative limb volumetrics reveal LLE ankle to knee (A-D) limb  volume = 4968.70ml, and ankle to groin (A-G) volume measures 11002.17 ml. AD volume is decreased by 6.35% towards the 10% goal.  A-G volume is decreased by 0.51% since commencing OT for CDT on 5/12/1. Treatment to lowewr extremity has been most effective below the knee whjere lymphatic congestion and dense malleolar fibrosis is observed,.   Rehab Potential Good   OT Frequency 3x / week   OT Duration 12 weeks   OT Treatment/Interventions DME and/or AE instruction;Manual lymph drainage;Patient/family education;Compression bandaging;Therapeutic exercises;Therapeutic activities;Other (comment);Manual Therapy;Self-care/ADL training  skin care   OT Home Exercise Plan such as [employer], to facilitate improved lymphatic and vascular function , and to limit fibrosis formation during HOS   Consulted and Agree with Plan of Care Patient      Patient will benefit from skilled therapeutic intervention in order to improve the following deficits and impairments:  Decreased knowledge of use of DME, Decreased skin integrity, Increased edema, Impaired flexibility, Pain, Decreased mobility, Decreased range of motion, Difficulty walking, Abnormal gait  Visit Diagnosis: Lymphedema, not elsewhere classified    Problem List Patient Active Problem List   Diagnosis Date Noted  . Frequent headaches 03/06/2016  . Microscopic hematuria 09/16/2015  . Bilateral renal cysts 09/16/2015  . Urinary frequency 09/16/2015  . Nocturia 09/16/2015  . Bilateral edema of lower extremity 09/09/2015  . Essential hypertension 09/09/2015  . Anemia 09/09/2015  . History of hematuria 09/09/2015     Andrey Spearman, MS, OTR/L, [facility] 05/07/2016 12:49 PM  [place] MAIN [facility] SERVICES [address] [place], [place], [address] Phone: [phone]   Fax:  [phone]  Name: Ann Barker MRN: [id number] Date of Birth: 1951/06/27

## 2016-05-07 NOTE — Patient Instructions (Signed)
LE instructions and precautions as established- see initial eval.   

## 2016-05-12 ENCOUNTER — Ambulatory Visit: Payer: Commercial Managed Care - HMO | Admitting: Occupational Therapy

## 2016-05-12 DIAGNOSIS — I890 Lymphedema, not elsewhere classified: Secondary | ICD-10-CM | POA: Diagnosis not present

## 2016-05-12 NOTE — Therapy (Signed)
[place] MAIN [facility] SERVICES [address] [place], [place], [address] Phone: [phone]   Fax:  [phone]  Occupational Therapy Treatment  Patient Details  Name: Ann Barker MRN: [id number] Date of Birth: 08-Dec-1951 No Data Recorded  Encounter Date: 05/12/2016      OT End of Session - 05/12/16 1654    Visit Number 8   Number of Visits 36   Date for OT Re-Evaluation 07/20/16   OT Start Time 0315   OT Stop Time 0415   OT Time Calculation (min) 60 min   Equipment Utilized During Treatment LE self care workbook   Activity Tolerance Patient tolerated treatment well;No increased pain   Behavior During Therapy [employer] for tasks assessed/performed      Past Medical History  Diagnosis Date  . Hypertension   . Hematuria     microscopic  . Lower extremity edema   . Anemia   . Arthritis   . Acid reflux   . UTI (lower urinary tract infection)   . Hoarseness   . HLD (hyperlipidemia)   . Vitamin D deficiency   . Dizziness   . Urge incontinence   . Renal cyst   . Gallstones   . Over weight     Past Surgical History  Procedure Laterality Date  . Abdominal hysterectomy  1981  . Hernia repair  2005  . Breast biopsy  1995/2005  . Tubal ligation  1975    There were no vitals filed for this visit.      Subjective Assessment - 05/12/16 1646    Subjective  Pt presents for OT visit 8 to address BLE lymphedema. Pt reports she wrapped both legs during visit interval. Pt also reports dizziness and some nausea over weekend, which did not seem to be related to compression wraps. She deniesPulse measured 77 and 02 measured 100% today. Unable to moniitor B with malfunctioning monitor today. Pt denies Hx of vertigo.Marland Kitchen     Pertinent History 2014 & 2015 B Laser vein ablation; Keller arthroplasty R 1st metatarsophalangeal joint 03/12/16. Reported hx of serious L groin infection and cellulitis s/p hysterectomy several years ago; BLE vein ablation  several years ago by report;;HTN   Limitations difficulty walking, difficulty w/ extended standing and sitting, difficulty fitting LB clothing and street shoes   Patient Stated Goals reduce swelling and leg pain to be able to do more   Pain Onset More than a month ago                      OT Treatments/Exercises (OP) - 05/12/16 0001    ADLs   ADL Education Given Yes   Manual Therapy   Manual Therapy Edema management;Compression Bandaging;Manual Lymphatic Drainage (MLD)   Edema Management Skin care w/  skin grade, low pH castor oil   Manual Lymphatic Drainage (MLD) Manual lymph drainage (MLD) to LLE in supine utilizing functional inguinal lymph nodes and deep abdominal lymphatics as is customary for non-cancer related lower extremity LE, including bilateral "short neck" sequence, J strokes to sub and supraclavicular LN, deep abdominal pathways, functional inguinal LN, lower extremity proximal to distal w/ emphasis on medial knee bottleneck and politeal LN. Performed fibrosis technique to B maleoli and distal  legs to address fatty fibrosis. Good tolerance.   Compression Bandaging Modified medial and lateral maleoli pads custom  fabricated from Medi dot grid padding by adding single layer of gray foam on outer surfaces.  LLE gradient compression wraps  applied from toes to below knee: toe wrap x1 under cotton stockinett; 8 cm x 5 m x 1 to foot and ankle, 10 cm  x 5 m x 2, 12 cm x5 cm x 1, applied circumferentially in custommary layered gradient configuration  over .04 x 10 cm x 5 m Rosidol Soft x 1.5 .rolls.  Will modify to A-G PRN                OT Education - 05/12/16 1648    Education provided Yes   Education Details Continued skilled Pt/caregiver Education  And LE ADL training throughout visit for lymphedema self care, including compression wrapping, compression garment and device wear/care, lymphatic pumping ther ex, simple self-MLD, and skin care. Discussed progress  towards goals.   Person(s) Educated Patient   Methods Explanation   Comprehension Verbalized understanding             OT Long Term Goals - 04/23/16 1617    OT LONG TERM GOAL #1   Title Lymphedema (LE) self-care: Pt able to apply multi layered, gradient compression wraps independently using proper techniques within 2 weeks to achieve optimal limb volume reduction.   Baseline dependent   Time 2   Period Weeks   Status New   OT LONG TERM GOAL #2   Title Lymphedema (LE) self-care:  Pt to achieve at least 10% BLE limb volume reductions during Intensive CDT to limit LE progression, decrease infection risk, to reduce pain/ discomfort, and to improve safe ambulation and functional mobility.   Baseline dependent   Time 12   Period Weeks   Status New   OT LONG TERM GOAL #3   Title Pt >/= 85 % compliant with all daily, LE self-care protocols for home program, including simple self-manual lymphatic drainage (MLD), skin care, lymphatic pumping the ex, skin care, and donning/ doffing compression wraps and garments using assistive devices PRN (modified independence)  to limit LE progression and further functional decline.     Baseline dependent   Time 12   Period Weeks   Status New   OT LONG TERM GOAL #4   Title Pt to tolerate daily compression wraps, garments and devices in keeping w/ prescribed wear regime within 1 week of issue date to progress and retain clinical and functional gains and to limit LE progression.   Baseline dependent   Time 12   Period Weeks   Status New   OT LONG TERM GOAL #5   Title During Management Phase CDT Pt to sustain limb volume reductions achieved during Intensive Phase CDT within 5% utilizing LE self-care protocols, appropriate compression garments/ devices, and needed level of caregiver assistance.   Baseline dependent   Time 6   Period Months   Status New               Plan - 05/12/16 1655    Clinical Impression Statement Pt continues to  demonstrate limb volume reductions at each visit, although progress appears to be slowing somewhat. Pt is wrapping bilaterally between visits in an effort to expedite care. Next visit we'll focus skilled OT training on full simple self MLD LE sequence.   Rehab Potential Good   OT Frequency 3x / week   OT Duration 12 weeks   OT Treatment/Interventions DME and/or AE instruction;Manual lymph drainage;Patient/family education;Compression bandaging;Therapeutic exercises;Therapeutic activities;Other (comment);Manual Therapy;Self-care/ADL training  skin care   OT Home Exercise Plan such as Myrtie Neither, to facilitate improved lymphatic and vascular function , and to limit  fibrosis formation during HOS   Consulted and Agree with Plan of Care Patient      Patient will benefit from skilled therapeutic intervention in order to improve the following deficits and impairments:  Decreased knowledge of use of DME, Decreased skin integrity, Increased edema, Impaired flexibility, Pain, Decreased mobility, Decreased range of motion, Difficulty walking, Abnormal gait  Visit Diagnosis: Lymphedema, not elsewhere classified    Problem List Patient Active Problem List   Diagnosis Date Noted  . Frequent headaches 03/06/2016  . Microscopic hematuria 09/16/2015  . Bilateral renal cysts 09/16/2015  . Urinary frequency 09/16/2015  . Nocturia 09/16/2015  . Bilateral edema of lower extremity 09/09/2015  . Essential hypertension 09/09/2015  . Anemia 09/09/2015  . History of hematuria 09/09/2015    Andrey Spearman, MS, OTR/L, [facility] 05/12/2016 4:59 PM   [place] MAIN [facility] SERVICES [address] [place], [place], [address] Phone: [phone]   Fax:  [phone]  Name: Ann Barker MRN: [id number] Date of Birth: 08/25/1951

## 2016-05-14 ENCOUNTER — Ambulatory Visit: Payer: Commercial Managed Care - HMO | Attending: Primary Care | Admitting: Occupational Therapy

## 2016-05-14 ENCOUNTER — Telehealth: Payer: Self-pay | Admitting: Primary Care

## 2016-05-14 DIAGNOSIS — I890 Lymphedema, not elsewhere classified: Secondary | ICD-10-CM | POA: Diagnosis not present

## 2016-05-14 NOTE — Telephone Encounter (Signed)
Noted and agree. Saturday clinic appropriate.

## 2016-05-14 NOTE — Therapy (Signed)
[place] MAIN [facility] SERVICES [address] [place], [place], [address] Phone: [phone]   Fax:  [phone]  Occupational Therapy Treatment  Patient Details  Name: ALAIAH MCMAINS MRN: [id number] Date of Birth: 27-Jun-1951 No Data Recorded  Encounter Date: 05/14/2016      OT End of Session - 05/14/16 1521    Visit Number 9   Number of Visits 36   Date for OT Re-Evaluation 07/20/16   OT Start Time 0111   OT Stop Time 0215   OT Time Calculation (min) 64 min   Equipment Utilized During Treatment LE self care workbook   Activity Tolerance Patient tolerated treatment well;No increased pain   Behavior During Therapy [facility] for tasks assessed/performed      Past Medical History  Diagnosis Date  . Hypertension   . Hematuria     microscopic  . Lower extremity edema   . Anemia   . Arthritis   . Acid reflux   . UTI (lower urinary tract infection)   . Hoarseness   . HLD (hyperlipidemia)   . Vitamin D deficiency   . Dizziness   . Urge incontinence   . Renal cyst   . Gallstones   . Over weight     Past Surgical History  Procedure Laterality Date  . Abdominal hysterectomy  1981  . Hernia repair  2005  . Breast biopsy  1995/2005  . Tubal ligation  1975    There were no vitals filed for this visit.      Subjective Assessment - 05/14/16 1323    Subjective  (p) Pt presents for OT visit 9 to address BLE lymphedema. Pt reports she has felt unwell since this past Sunday when she began to feel dizzy. Pt notified her MD.   Pertinent History (p) 2014 & 2015 B Laser vein ablation; Keller arthroplasty R 1st metatarsophalangeal joint 03/12/16. Reported hx of serious L groin infection and cellulitis s/p hysterectomy several years ago; BLE vein ablation several years ago by report;;HTN   Limitations (p) difficulty walking, difficulty w/ extended standing and sitting, difficulty fitting LB clothing and street shoes   Patient Stated Goals (p) reduce  swelling and leg pain to be able to do more   Currently in Pain? (p) No/denies   Pain Onset (p) More than a month ago             LYMPHEDEMA/ONCOLOGY QUESTIONNAIRE - 05/14/16 1523    Left Lower Extremity Lymphedema   Other LLE A-D volume = 5025.53ml; A-G volume = 10846.26 ml. AD volume is decreased by 6.35%. A-G volume is decreased by 0.51% since commencing OT for CDT on 04/23/16.                  OT Treatments/Exercises (OP) - 05/14/16 0001    Manual Therapy   Manual therapy comments Completed  comparative LLE volumetric measurements   Edema Management Skin care w/  skin grade, low pH castor oil   Manual Lymphatic Drainage (MLD) Manual lymph drainage (MLD) to LLE in supine utilizing functional inguinal lymph nodes and deep abdominal lymphatics as is customary for non-cancer related lower extremity LE, including bilateral "short neck" sequence, J strokes to sub and supraclavicular LN, deep abdominal pathways, functional inguinal LN, lower extremity proximal to distal w/ emphasis on medial knee bottleneck and politeal LN. Performed fibrosis technique to B maleoli and distal  legs to address fatty fibrosis. Good tolerance.   Compression Bandaging Modified medial and lateral maleoli  pads custom  fabricated from Medi dot grid padding by adding single layer of gray foam on outer surfaces.  LLE gradient compression wraps applied from toes to below knee: toe wrap x1 under cotton stockinett; 8 cm x 5 m x 1 to foot and ankle, 10 cm  x 5 m x 2, 12 cm x5 cm x 1, applied circumferentially in custommary layered gradient configuration  over .04 x 10 cm x 5 m Rosidol Soft x 1.5 .rolls.  Will modify to A-G PRN                OT Education - 05/14/16 1521    Education provided No   Education Details LE self care edu limited today due to Pt feeling unwell             OT Long Term Goals - 04/23/16 1617    OT LONG TERM GOAL #1   Title Lymphedema (LE) self-care: Pt able to apply  multi layered, gradient compression wraps independently using proper techniques within 2 weeks to achieve optimal limb volume reduction.   Baseline dependent   Time 2   Period Weeks   Status New   OT LONG TERM GOAL #2   Title Lymphedema (LE) self-care:  Pt to achieve at least 10% BLE limb volume reductions during Intensive CDT to limit LE progression, decrease infection risk, to reduce pain/ discomfort, and to improve safe ambulation and functional mobility.   Baseline dependent   Time 12   Period Weeks   Status New   OT LONG TERM GOAL #3   Title Pt >/= 85 % compliant with all daily, LE self-care protocols for home program, including simple self-manual lymphatic drainage (MLD), skin care, lymphatic pumping the ex, skin care, and donning/ doffing compression wraps and garments using assistive devices PRN (modified independence)  to limit LE progression and further functional decline.     Baseline dependent   Time 12   Period Weeks   Status New   OT LONG TERM GOAL #4   Title Pt to tolerate daily compression wraps, garments and devices in keeping w/ prescribed wear regime within 1 week of issue date to progress and retain clinical and functional gains and to limit LE progression.   Baseline dependent   Time 12   Period Weeks   Status New   OT LONG TERM GOAL #5   Title During Management Phase CDT Pt to sustain limb volume reductions achieved during Intensive Phase CDT within 5% utilizing LE self-care protocols, appropriate compression garments/ devices, and needed level of caregiver assistance.   Baseline dependent   Time 6   Period Months   Status New               Plan - 05/14/16 1522    Clinical Impression Statement LLE comparative limb volumetrics reveal ankle to knee (A-D) limb volume is increased by 1.15% since last measured on 05/07/16, while it is decreased overall by 5.27% since commencing OT for CDT. This value does not meat the 10% limb volume reduction goal. The ankle  to groin (A-G) limb volume is decreased by 1.41% since last measured, and by 1.92% overall. LLE lymb volume and dense fibrosis at ankle pads is very stubborn in this case.   Rehab Potential Good   OT Frequency 3x / week   OT Duration 12 weeks   OT Treatment/Interventions DME and/or AE instruction;Manual lymph drainage;Patient/family education;Compression bandaging;Therapeutic exercises;Therapeutic activities;Other (comment);Manual Therapy;Self-care/ADL training  skin care   OT  Home Exercise Plan such as Myrtie Neither, to facilitate improved lymphatic and vascular function , and to limit fibrosis formation during HOS   Consulted and Agree with Plan of Care Patient      Patient will benefit from skilled therapeutic intervention in order to improve the following deficits and impairments:  Decreased knowledge of use of DME, Decreased skin integrity, Increased edema, Impaired flexibility, Pain, Decreased mobility, Decreased range of motion, Difficulty walking, Abnormal gait  Visit Diagnosis: Lymphedema, not elsewhere classified    Problem List Patient Active Problem List   Diagnosis Date Noted  . Frequent headaches 03/06/2016  . Microscopic hematuria 09/16/2015  . Bilateral renal cysts 09/16/2015  . Urinary frequency 09/16/2015  . Nocturia 09/16/2015  . Bilateral edema of lower extremity 09/09/2015  . Essential hypertension 09/09/2015  . Anemia 09/09/2015  . History of hematuria 09/09/2015    . Andrey Spearman, MS, OTR/L, [facility] 05/14/2016 3:32 PM   [place] MAIN [facility] SERVICES [address] [place], [place], [address] Phone: [phone]   Fax:  [phone]  Name: DAWNIELLE TACKMAN MRN: [id number] Date of Birth: [birth year]

## 2016-05-14 NOTE — Patient Instructions (Signed)
LE instructions and precautions as established- see initial eval.   

## 2016-05-14 NOTE — Telephone Encounter (Signed)
[place] [facility]  Patient Name: Ann Barker  DOB: 11-25-1951    Initial Comment Caller states, has to get leg wrapped for swelling. Her leg keeps on swelling, and she has dizzy spells. She felt Rt ear pop last night and it seemed like water was draining. She then had to use the restroom a lot last night.    Nurse Assessment  Nurse: Wynetta Emery, RN, [facility] Janus Date/Time Eilene Ghazi Time): 05/14/2016 10:05:52 AM  Confirm and document reason for call. If symptomatic, describe symptoms. You must click the next button to save text entered. ---[name] has swelling of legs and feet usually gets them wrapped to help with fluid buildup -(done at the hospital) woke up Sunday with dizziness and got better but went to PT - who said get in touch with [facility]-- last evening heard pop in right ear then felt like heard water running in right ear dizziness remains was up and down last night with frequent urination does not feel well  Has the patient traveled out of the country within the last 30 days? ---No  Does the patient have any new or worsening symptoms? ---Yes  Will a triage be completed? ---Yes  Related visit to physician within the last 2 weeks? ---No  Does the PT have any chronic conditions? (i.e. diabetes, asthma, etc.) ---Unknown  Is this a behavioral health or substance abuse call? ---No     Guidelines    Guideline Title Affirmed Question Affirmed Notes  Dizziness - Vertigo [1] MODERATE dizziness (e.g., vertigo; feels very unsteady, interferes with normal activities) AND [2] has NOT been evaluated by physician for this    Final Disposition User   See Physician within 24 Hours [place], RN, Baker Janus    Comments  [name] was not able to secure an appt at [facility] today - did not want to go anywhere else since has PT appt at 1pm She was advised to go to [facility] office tomorrow call and get appt gave her the number to call states she  will call them.   Referrals  REFERRED TO PCP OFFICE  [facility]   Disagree/Comply: Comply

## 2016-05-17 ENCOUNTER — Ambulatory Visit: Payer: Commercial Managed Care - HMO | Admitting: Occupational Therapy

## 2016-05-17 DIAGNOSIS — I890 Lymphedema, not elsewhere classified: Secondary | ICD-10-CM | POA: Diagnosis not present

## 2016-05-17 NOTE — Therapy (Signed)
[place] MAIN [facility] SERVICES [address] [place], [place], [address] Phone: [phone]   Fax:  [phone]  Occupational Therapy Treatment  Patient Details  Name: [name] MRN: [id number] Date of Birth: September 29, 1951 No Data Recorded  Encounter Date: 05/17/2016      OT End of Session - 05/17/16 1741    Visit Number 10   Number of Visits 36   Date for OT Re-Evaluation 07/20/16   OT Start Time 0316   OT Stop Time 0415   OT Time Calculation (min) 59 min   Equipment Utilized During Treatment LE self care workbook   Activity Tolerance Patient tolerated treatment well;No increased pain   Behavior During Therapy [facility] for tasks assessed/performed      Past Medical History  Diagnosis Date  . Hypertension   . Hematuria     microscopic  . Lower extremity edema   . Anemia   . Arthritis   . Acid reflux   . UTI (lower urinary tract infection)   . Hoarseness   . HLD (hyperlipidemia)   . Vitamin D deficiency   . Dizziness   . Urge incontinence   . Renal cyst   . Gallstones   . Over weight     Past Surgical History  Procedure Laterality Date  . Abdominal hysterectomy  1981  . Hernia repair  2005  . Breast biopsy  1995/2005  . Tubal ligation  1975    There were no vitals filed for this visit.      Subjective Assessment - 05/17/16 1741    Subjective  Pt presents for OT visit 9 to address BLE lymphedema. Pt reports she has felt unwell since this past Sunday when she began to feel dizzy. Pt notified her MD.   Pertinent History 2014 & 2015 B Laser vein ablation; Keller arthroplasty R 1st metatarsophalangeal joint 03/12/16. Reported hx of serious L groin infection and cellulitis s/p hysterectomy several years ago; BLE vein ablation several years ago by report;;HTN   Limitations difficulty walking, difficulty w/ extended standing and sitting, difficulty fitting LB clothing and street shoes   Patient Stated Goals reduce swelling and  leg pain to be able to do more   Pain Onset More than a month ago                      OT Treatments/Exercises (OP) - 05/17/16 0001    Manual Therapy   Manual Therapy (p) Edema management;Compression Bandaging;Manual Lymphatic Drainage (MLD)   Edema Management (p) Skin care w/  skin grade, low pH castor oil   Manual Lymphatic Drainage (MLD) (p) Manual lymph drainage (MLD) to LLE in supine utilizing functional inguinal lymph nodes and deep abdominal lymphatics as is customary for non-cancer related lower extremity LE, including bilateral "short neck" sequence, J strokes to sub and supraclavicular LN, deep abdominal pathways, functional inguinal LN, lower extremity proximal to distal w/ emphasis on medial knee bottleneck and politeal LN. Performed fibrosis technique to B maleoli and distal  legs to address fatty fibrosis. Good tolerance.   Compression Bandaging (p) Modified medial and lateral maleoli pads custom  fabricated from Medi dot grid padding by adding single layer of gray foam on outer surfaces.  LLE gradient compression wraps applied from toes to below knee: toe wrap x1 under cotton stockinett; 8 cm x 5 m x 1 to foot and ankle, 10 cm  x 5 m x 2, 12 cm x5 cm x 1,  applied circumferentially in custommary layered gradient configuration  over .04 x 10 cm x 5 m Rosidol Soft x 1.5 .rolls.  Will modify to A-G PRN                     OT Long Term Goals - 04/23/16 1617    OT LONG TERM GOAL #1   Title Lymphedema (LE) self-care: Pt able to apply multi layered, gradient compression wraps independently using proper techniques within 2 weeks to achieve optimal limb volume reduction.   Baseline dependent   Time 2   Period Weeks   Status New   OT LONG TERM GOAL #2   Title Lymphedema (LE) self-care:  Pt to achieve at least 10% BLE limb volume reductions during Intensive CDT to limit LE progression, decrease infection risk, to reduce pain/ discomfort, and to improve safe  ambulation and functional mobility.   Baseline dependent   Time 12   Period Weeks   Status New   OT LONG TERM GOAL #3   Title Pt >/= 85 % compliant with all daily, LE self-care protocols for home program, including simple self-manual lymphatic drainage (MLD), skin care, lymphatic pumping the ex, skin care, and donning/ doffing compression wraps and garments using assistive devices PRN (modified independence)  to limit LE progression and further functional decline.     Baseline dependent   Time 12   Period Weeks   Status New   OT LONG TERM GOAL #4   Title Pt to tolerate daily compression wraps, garments and devices in keeping w/ prescribed wear regime within 1 week of issue date to progress and retain clinical and functional gains and to limit LE progression.   Baseline dependent   Time 12   Period Weeks   Status New   OT LONG TERM GOAL #5   Title During Management Phase CDT Pt to sustain limb volume reductions achieved during Intensive Phase CDT within 5% utilizing LE self-care protocols, appropriate compression garments/ devices, and needed level of caregiver assistance.   Baseline dependent   Time 6   Period Months   Status New               Plan - 05/17/16 1742    Clinical Impression Statement By end of lsession Pt able to perform neck sequence, thigh section, and MLD to anterior knee w/ Max A. Cont skilled edu and viisual cues helpful. Provided LE anatomy wall poster today as reference for self care at home. Limb volume continues to be dense in distal leg and fibrosis at ankle  is slightly softerr, despite stubborness. Fabricate custom chip pads for ankles next visiit for more agressive fibrosis high low pressure concentration.   Rehab Potential Good   OT Frequency 3x / week   OT Duration 12 weeks   OT Treatment/Interventions DME and/or AE instruction;Manual lymph drainage;Patient/family education;Compression bandaging;Therapeutic exercises;Therapeutic activities;Other  (comment);Manual Therapy;Self-care/ADL training  skin care   OT Home Exercise Plan such as [employer], to facilitate improved lymphatic and vascular function , and to limit fibrosis formation during HOS   Consulted and Agree with Plan of Care Patient      Patient will benefit from skilled therapeutic intervention in order to improve the following deficits and impairments:  Decreased knowledge of use of DME, Decreased skin integrity, Increased edema, Impaired flexibility, Pain, Decreased mobility, Decreased range of motion, Difficulty walking, Abnormal gait  Visit Diagnosis: Lymphedema, not elsewhere classified    Problem List Patient Active Problem List  Diagnosis Date Noted  . Frequent headaches 03/06/2016  . Microscopic hematuria 09/16/2015  . Bilateral renal cysts 09/16/2015  . Urinary frequency 09/16/2015  . Nocturia 09/16/2015  . Bilateral edema of lower extremity 09/09/2015  . Essential hypertension 09/09/2015  . Anemia 09/09/2015  . History of hematuria 09/09/2015    Andrey Spearman, MS, OTR/L, [facility] 05/17/2016 5:46 PM   [place] MAIN [facility] SERVICES [address] [place], [place], [address] Phone: [phone]   Fax:  [phone]  Name: Ann Barker MRN: [id number] Date of Birth: [birth year]

## 2016-05-17 NOTE — Patient Instructions (Signed)

## 2016-05-19 ENCOUNTER — Ambulatory Visit: Payer: Commercial Managed Care - HMO | Admitting: Occupational Therapy

## 2016-05-19 DIAGNOSIS — I890 Lymphedema, not elsewhere classified: Secondary | ICD-10-CM

## 2016-05-19 NOTE — Patient Instructions (Signed)
LE instructions and precautions as established- see initial eval.   
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
## 2016-05-21 ENCOUNTER — Ambulatory Visit: Payer: Commercial Managed Care - HMO | Admitting: Occupational Therapy

## 2016-05-24 ENCOUNTER — Ambulatory Visit: Payer: Commercial Managed Care - HMO | Admitting: Occupational Therapy

## 2016-05-25 ENCOUNTER — Ambulatory Visit (INDEPENDENT_AMBULATORY_CARE_PROVIDER_SITE_OTHER): Payer: Commercial Managed Care - HMO

## 2016-05-25 ENCOUNTER — Encounter: Payer: Self-pay | Admitting: Podiatry

## 2016-05-25 ENCOUNTER — Ambulatory Visit (INDEPENDENT_AMBULATORY_CARE_PROVIDER_SITE_OTHER): Payer: Commercial Managed Care - HMO | Admitting: Podiatry

## 2016-05-25 VITALS — BP 142/88 | HR 69 | Resp 12

## 2016-05-25 DIAGNOSIS — Z9889 Other specified postprocedural states: Secondary | ICD-10-CM

## 2016-05-25 DIAGNOSIS — M205X1 Other deformities of toe(s) (acquired), right foot: Secondary | ICD-10-CM | POA: Diagnosis not present

## 2016-05-26 ENCOUNTER — Ambulatory Visit: Payer: Commercial Managed Care - HMO | Admitting: Occupational Therapy

## 2016-05-26 DIAGNOSIS — I890 Lymphedema, not elsewhere classified: Secondary | ICD-10-CM | POA: Diagnosis not present

## 2016-05-26 NOTE — Progress Notes (Signed)
She presents today for follow-up of surgery Keller arthroplasty single silicone implant right foot date of surgery 03/12/2016. She denies fever chills nausea vomiting muscle aches and pains states that seems to be doing fine. This that she still has considerable left back issues and is currently utilizing a lymphatic clinic.  Objective: Vital signs are stable she's alert 3 moderate swelling bilateral lower extremities ulcers are palpable great range of motion of the first metatarsophalangeal joint right foot without symptoms. Radiographs confirmed Keller arthroplasty single silicone implant with no grommets.   Assessment well-healing surgical foot.  Plan: Follow up with me as needed.

## 2016-05-26 NOTE — Patient Instructions (Signed)
LE instructions and precautions as established- see initial eval.   

## 2016-05-26 NOTE — Therapy (Signed)
[place] MAIN [facility] SERVICES [address] [place], [place], [address] Phone: [phone]   Fax:  [phone]  Occupational Therapy Treatment  Patient Details  Name: Ann Barker MRN: [id number] Date of Birth: 1951/05/19 No Data Recorded  Encounter Date: 05/26/2016      OT End of Session - 05/26/16 1724    Visit Number 12   Number of Visits 36   Date for OT Re-Evaluation 07/20/16   OT Start Time 0315   OT Stop Time 0420   OT Time Calculation (min) 65 min      Past Medical History  Diagnosis Date  . Hypertension   . Hematuria     microscopic  . Lower extremity edema   . Anemia   . Arthritis   . Acid reflux   . UTI (lower urinary tract infection)   . Hoarseness   . HLD (hyperlipidemia)   . Vitamin D deficiency   . Dizziness   . Urge incontinence   . Renal cyst   . Gallstones   . Over weight     Past Surgical History  Procedure Laterality Date  . Abdominal hysterectomy  1981  . Hernia repair  2005  . Breast biopsy  1995/2005  . Tubal ligation  1975    There were no vitals filed for this visit.      Subjective Assessment - 05/26/16 1722    Subjective  Pt presents for OT visit 11 to address BLE lymphedema. Pt has no new complaints. Pt states she missed last appointment b/c she was unable to get back in time from holiday weekend.   Pertinent History 2014 & 2015 B Laser vein ablation; Keller arthroplasty R 1st metatarsophalangeal joint 03/12/16. Reported hx of serious L groin infection and cellulitis s/p hysterectomy several years ago; BLE vein ablation several years ago by report;;HTN   Limitations difficulty walking, difficulty w/ extended standing and sitting, difficulty fitting LB clothing and street shoes   Patient Stated Goals reduce swelling and leg pain to be able to do more   Pain Onset More than a month ago                      OT Treatments/Exercises (OP) - 05/26/16 0001    Manual Therapy   Manual Therapy Edema management;Compression Bandaging;Manual Lymphatic Drainage (MLD)   Edema Management Skin care w/  skin grade, low pH castor oil   Manual Lymphatic Drainage (MLD) Manual lymph drainage (MLD) to LLE in supine utilizing functional inguinal lymph nodes and deep abdominal lymphatics as is customary for non-cancer related lower extremity LE, including bilateral "short neck" sequence, J strokes to sub and supraclavicular LN, deep abdominal pathways, functional inguinal LN, lower extremity proximal to distal w/ emphasis on medial knee bottleneck and politeal LN. Performed fibrosis technique to B maleoli and distal  legs to address fatty fibrosis. Good tolerance.   Compression Bandaging Modified medial and lateral maleoli pads custom  fabricated from Medi dot grid padding by adding single layer of gray foam on outer surfaces.  LLE gradient compression wraps applied from toes to below knee: toe wrap x1 under cotton stockinett; 8 cm x 5 m x 1 to foot and ankle, 10 cm  x 5 m x 2, 12 cm x5 cm x 1, applied circumferentially in custommary layered gradient configuration  over .04 x 10 cm x 5 m Rosidol Soft x 1.5 .rolls.  Will modify to A-G PRN  OT Education - 05/26/16 1724    Education provided Yes   Education Details Continued skilled Pt/caregiver Education  And LE ADL training throughout visit for lymphedema self care, including compression wrapping, compression garment and device wear/care, lymphatic pumping ther ex, simple self-MLD, and skin care. Discussed progress towards goals.   Person(s) Educated Patient   Methods Explanation   Comprehension Verbalized understanding;Need further instruction             OT Long Term Goals - 04/23/16 1617    OT LONG TERM GOAL #1   Title Lymphedema (LE) self-care: Pt able to apply multi layered, gradient compression wraps independently using proper techniques within 2 weeks to achieve optimal limb volume reduction.   Baseline  dependent   Time 2   Period Weeks   Status New   OT LONG TERM GOAL #2   Title Lymphedema (LE) self-care:  Pt to achieve at least 10% BLE limb volume reductions during Intensive CDT to limit LE progression, decrease infection risk, to reduce pain/ discomfort, and to improve safe ambulation and functional mobility.   Baseline dependent   Time 12   Period Weeks   Status New   OT LONG TERM GOAL #3   Title Pt >/= 85 % compliant with all daily, LE self-care protocols for home program, including simple self-manual lymphatic drainage (MLD), skin care, lymphatic pumping the ex, skin care, and donning/ doffing compression wraps and garments using assistive devices PRN (modified independence)  to limit LE progression and further functional decline.     Baseline dependent   Time 12   Period Weeks   Status New   OT LONG TERM GOAL #4   Title Pt to tolerate daily compression wraps, garments and devices in keeping w/ prescribed wear regime within 1 week of issue date to progress and retain clinical and functional gains and to limit LE progression.   Baseline dependent   Time 12   Period Weeks   Status New   OT LONG TERM GOAL #5   Title During Management Phase CDT Pt to sustain limb volume reductions achieved during Intensive Phase CDT within 5% utilizing LE self-care protocols, appropriate compression garments/ devices, and needed level of caregiver assistance.   Baseline dependent   Time 6   Period Months   Status New               Plan - 05/26/16 1725    Clinical Impression Statement No visible or palpable changes in volumes or tissue density today since last visit. Pt tolerating treatment without difficulty. Will complete volumetrics next visit and start garment procurement process for LLE. Consider custom ccl 3 elvarex AD and custom ccl 2 toe cap.   Rehab Potential Good   OT Frequency 3x / week   OT Duration 12 weeks   OT Treatment/Interventions DME and/or AE instruction;Manual lymph  drainage;Patient/family education;Compression bandaging;Therapeutic exercises;Therapeutic activities;Other (comment);Manual Therapy;Self-care/ADL training  skin care   OT Home Exercise Plan such as [employer], to facilitate improved lymphatic and vascular function , and to limit fibrosis formation during HOS   Consulted and Agree with Plan of Care Patient      Patient will benefit from skilled therapeutic intervention in order to improve the following deficits and impairments:  Decreased knowledge of use of DME, Decreased skin integrity, Increased edema, Impaired flexibility, Pain, Decreased mobility, Decreased range of motion, Difficulty walking, Abnormal gait  Visit Diagnosis: Lymphedema, not elsewhere classified    Problem List Patient Active Problem List  Diagnosis Date Noted  . Frequent headaches 03/06/2016  . Microscopic hematuria 09/16/2015  . Bilateral renal cysts 09/16/2015  . Urinary frequency 09/16/2015  . Nocturia 09/16/2015  . Bilateral edema of lower extremity 09/09/2015  . Essential hypertension 09/09/2015  . Anemia 09/09/2015  . History of hematuria 09/09/2015   Andrey Spearman, MS, OTR/L, [facility] 05/26/2016 5:27 PM  [place] MAIN [facility] SERVICES [address] [place], [place], [address] Phone: [phone]   Fax:  [phone]  Name: Ann Barker MRN: [id number] Date of Birth: [birth year]

## 2016-05-28 ENCOUNTER — Ambulatory Visit: Payer: Commercial Managed Care - HMO | Admitting: Occupational Therapy

## 2016-05-28 DIAGNOSIS — I890 Lymphedema, not elsewhere classified: Secondary | ICD-10-CM

## 2016-05-28 NOTE — Patient Instructions (Signed)
LE instructions and precautions as established- see initial eval.   

## 2016-05-28 NOTE — Therapy (Signed)
[place] MAIN [facility] SERVICES [address] [place], [place], [address] Phone: [phone]   Fax:  [phone]  Occupational Therapy Treatment  Patient Details  Name: Ann Barker MRN: [id number] Date of Birth: 10/04/1951 No Data Recorded  Encounter Date: 05/28/2016      OT End of Session - 05/28/16 1148    Visit Number 13   Number of Visits 36   Date for OT Re-Evaluation 07/20/16   OT Start Time 1013   OT Stop Time 1121   OT Time Calculation (min) 68 min      Past Medical History  Diagnosis Date  . Hypertension   . Hematuria     microscopic  . Lower extremity edema   . Anemia   . Arthritis   . Acid reflux   . UTI (lower urinary tract infection)   . Hoarseness   . HLD (hyperlipidemia)   . Vitamin D deficiency   . Dizziness   . Urge incontinence   . Renal cyst   . Gallstones   . Over weight     Past Surgical History  Procedure Laterality Date  . Abdominal hysterectomy  1981  . Hernia repair  2005  . Breast biopsy  1995/2005  . Tubal ligation  1975    There were no vitals filed for this visit.      Subjective Assessment - 05/28/16 1145    Subjective  Pt presents for OT visit 13 to address BLE lymphedema. Pt has no new complaints.Pt reports LLE wraps fell down not long after last visit. She removed them and did not reapply.   Pertinent History 2014 & 2015 B Laser vein ablation; Keller arthroplasty R 1st metatarsophalangeal joint 03/12/16. Reported hx of serious L groin infection and cellulitis s/p hysterectomy several years ago; BLE vein ablation several years ago by report;;HTN   Limitations difficulty walking, difficulty w/ extended standing and sitting, difficulty fitting LB clothing and street shoes   Patient Stated Goals reduce swelling and leg pain to be able to do more   Currently in Pain? No/denies   Pain Onset More than a month ago                      OT Treatments/Exercises (OP) - 05/28/16  0001    ADLs   ADL Education Given Yes   Manual Therapy   Manual Therapy Edema management;Compression Bandaging;Manual Lymphatic Drainage (MLD)   Manual therapy comments Completed BLE anatomical measurements and determined Pt does not currently fit into off the shelf garment.   Edema Management Skin care w/  skin grade, low pH castor oil   Manual Lymphatic Drainage (MLD) Manual lymph drainage (MLD) to LLE in supine utilizing functional inguinal lymph nodes and deep abdominal lymphatics as is customary for non-cancer related lower extremity LE, including bilateral "short neck" sequence, J strokes to sub and supraclavicular LN, deep abdominal pathways, functional inguinal LN, lower extremity proximal to distal w/ emphasis on medial knee bottleneck and politeal LN. Performed fibrosis technique to B maleoli and distal  legs to address fatty fibrosis. Good tolerance.   Compression Bandaging Modified medial and lateral maleoli pads custom  fabricated from Medi dot grid padding by adding single layer of gray foam on outer surfaces.  LLE gradient compression wraps applied from toes to below knee: toe wrap x1 under cotton stockinett; 8 cm x 5 m x 1 to foot and ankle, 10 cm  x 5 m x 2, 12  cm x5 cm x 1, applied circumferentially in custommary layered gradient configuration  over .04 x 10 cm x 5 m Rosidol Soft x 1.5 .rolls.  Will modify to A-G PRN                OT Education - 05/28/16 1147    Education provided Yes   Education Details Pt educated on various  types of traditional elastic compression garments, including flat vs circular knit, measuring and fitting process, and recommendations in her case. Provided samples and demonstratiions/.   Person(s) Educated Patient   Methods Explanation;Demonstration   Comprehension Verbalized understanding;Need further instruction             OT Long Term Goals - 04/23/16 1617    OT LONG TERM GOAL #1   Title Lymphedema (LE) self-care: Pt able to apply  multi layered, gradient compression wraps independently using proper techniques within 2 weeks to achieve optimal limb volume reduction.   Baseline dependent   Time 2   Period Weeks   Status New   OT LONG TERM GOAL #2   Title Lymphedema (LE) self-care:  Pt to achieve at least 10% BLE limb volume reductions during Intensive CDT to limit LE progression, decrease infection risk, to reduce pain/ discomfort, and to improve safe ambulation and functional mobility.   Baseline dependent   Time 12   Period Weeks   Status New   OT LONG TERM GOAL #3   Title Pt >/= 85 % compliant with all daily, LE self-care protocols for home program, including simple self-manual lymphatic drainage (MLD), skin care, lymphatic pumping the ex, skin care, and donning/ doffing compression wraps and garments using assistive devices PRN (modified independence)  to limit LE progression and further functional decline.     Baseline dependent   Time 12   Period Weeks   Status New   OT LONG TERM GOAL #4   Title Pt to tolerate daily compression wraps, garments and devices in keeping w/ prescribed wear regime within 1 week of issue date to progress and retain clinical and functional gains and to limit LE progression.   Baseline dependent   Time 12   Period Weeks   Status New   OT LONG TERM GOAL #5   Title During Management Phase CDT Pt to sustain limb volume reductions achieved during Intensive Phase CDT within 5% utilizing LE self-care protocols, appropriate compression garments/ devices, and needed level of caregiver assistance.   Baseline dependent   Time 6   Period Months   Status New               Plan - 05/28/16 1151    Clinical Impression Statement Anatomical measurements reveal that Pt does not fit into an off the shelf compression knee high at present. Pt will benefit from custom Elvarex knee high, ccl 3, to achieve proper fit and containment. Pt understands and agrees w/ plan. We'll complete LLE garment  measurements and volumetrics next week.   Rehab Potential Good   OT Frequency 3x / week   OT Duration 12 weeks   OT Treatment/Interventions DME and/or AE instruction;Manual lymph drainage;Patient/family education;Compression bandaging;Therapeutic exercises;Therapeutic activities;Other (comment);Manual Therapy;Self-care/ADL training  skin care   OT Home Exercise Plan such as [employer], to facilitate improved lymphatic and vascular function , and to limit fibrosis formation during HOS   Consulted and Agree with Plan of Care Patient      Patient will benefit from skilled therapeutic intervention in order to improve the  following deficits and impairments:  Decreased knowledge of use of DME, Decreased skin integrity, Increased edema, Impaired flexibility, Pain, Decreased mobility, Decreased range of motion, Difficulty walking, Abnormal gait  Visit Diagnosis: Lymphedema, not elsewhere classified    Problem List Patient Active Problem List   Diagnosis Date Noted  . Frequent headaches 03/06/2016  . Microscopic hematuria 09/16/2015  . Bilateral renal cysts 09/16/2015  . Urinary frequency 09/16/2015  . Nocturia 09/16/2015  . Bilateral edema of lower extremity 09/09/2015  . Essential hypertension 09/09/2015  . Anemia 09/09/2015  . History of hematuria 09/09/2015    Andrey Spearman, MS, OTR/L, [facility] 05/28/2016 11:54 AM  [place] MAIN [facility] SERVICES [address] [place], [place], [address] Phone: [phone]   Fax:  [phone]  Name: Ann Barker MRN: [id number] Date of Birth: Feb 20, 1951

## 2016-05-31 ENCOUNTER — Ambulatory Visit: Payer: Commercial Managed Care - HMO | Admitting: Occupational Therapy

## 2016-05-31 ENCOUNTER — Ambulatory Visit: Payer: Commercial Managed Care - HMO | Admitting: Primary Care

## 2016-05-31 DIAGNOSIS — I890 Lymphedema, not elsewhere classified: Secondary | ICD-10-CM | POA: Diagnosis not present

## 2016-05-31 NOTE — Patient Instructions (Signed)
LE instructions and precautions as established- see initial eval.   

## 2016-05-31 NOTE — Therapy (Signed)
[place] MAIN [facility] SERVICES [address] [place], [place], [address] Phone: [phone]   Fax:  [phone]  Occupational Therapy Treatment  Patient Details  Name: Ann Barker MRN: [id number] Date of Birth: [birth year] No Data Recorded  Encounter Date: 05/31/2016      OT End of Session - 05/31/16 1613    Visit Number 14   Number of Visits 36   Date for OT Re-Evaluation 07/20/16   OT Start Time 0306   OT Stop Time 0406   OT Time Calculation (min) 60 min      Past Medical History  Diagnosis Date  . Hypertension   . Hematuria     microscopic  . Lower extremity edema   . Anemia   . Arthritis   . Acid reflux   . UTI (lower urinary tract infection)   . Hoarseness   . HLD (hyperlipidemia)   . Vitamin D deficiency   . Dizziness   . Urge incontinence   . Renal cyst   . Gallstones   . Over weight     Past Surgical History  Procedure Laterality Date  . Abdominal hysterectomy  1981  . Hernia repair  2005  . Breast biopsy  1995/2005  . Tubal ligation  1975    There were no vitals filed for this visit.      Subjective Assessment - 05/31/16 1513    Subjective  Pt presents for OT visit 14 to address BLE lymphedema. Emphasis of visit today on Pt edu for LE self care components, including lymphatic pumping ther ex and simple self MLD.   Pertinent History 2014 & 2015 B Laser vein ablation; Keller arthroplasty R 1st metatarsophalangeal joint 03/12/16. Reported hx of serious L groin infection and cellulitis s/p hysterectomy several years ago; BLE vein ablation several years ago by report;;HTN   Limitations difficulty walking, difficulty w/ extended standing and sitting, difficulty fitting LB clothing and street shoes   Patient Stated Goals reduce swelling and leg pain to be able to do more   Pain Onset More than a month ago                      OT Treatments/Exercises (OP) - 05/31/16 0001    ADLs   ADL Education  Given Yes   Manual Therapy   Manual Therapy Edema management;Manual Lymphatic Drainage (MLD);Compression Bandaging;Other (comment)  BLE lymphatic pumping ther ex   Edema Management Skin care w/  skin grade, low pH castor oil   Manual Lymphatic Drainage (MLD) Manual lymph drainage (MLD) to LLE in supine utilizing functional inguinal lymph nodes and deep abdominal lymphatics as is customary for non-cancer related lower extremity LE, including bilateral "short neck" sequence, J strokes to sub and supraclavicular LN, deep abdominal pathways, functional inguinal LN, lower extremity proximal to distal w/ emphasis on medial knee bottleneck and politeal LN. Performed fibrosis technique to B maleoli and distal  legs to address fatty fibrosis. Good tolerance.   Compression Bandaging Modified medial and lateral maleoli pads custom  fabricated from Medi dot grid padding by adding single layer of gray foam on outer surfaces.  LLE gradient compression wraps applied from toes to below knee: toe wrap x1 under cotton stockinett; 8 cm x 5 m x 1 to foot and ankle, 10 cm  x 5 m x 2, 12 cm x5 cm x 1, applied circumferentially in custommary layered gradient configuration  over .04 x 10 cm x 5 m  Rosidol Soft x 1.5 .rolls.  Will modify to A-G PRN                OT Education - 05/31/16 1612    Education provided Yes   Education Details Pt education for BLE lymphatic pumping ther ex and simple self MLD utilizing short neck, abdominal pathways, and sequences for non cancer related LE utilizing functional groin nodes.   Person(s) Educated Patient   Methods Explanation;Demonstration;Tactile cues;Verbal cues;Handout   Comprehension Verbalized understanding;Returned demonstration;Verbal cues required;Tactile cues required;Need further instruction             OT Long Term Goals - 04/23/16 1617    OT LONG TERM GOAL #1   Title Lymphedema (LE) self-care: Pt able to apply multi layered, gradient compression wraps  independently using proper techniques within 2 weeks to achieve optimal limb volume reduction.   Baseline dependent   Time 2   Period Weeks   Status New   OT LONG TERM GOAL #2   Title Lymphedema (LE) self-care:  Pt to achieve at least 10% BLE limb volume reductions during Intensive CDT to limit LE progression, decrease infection risk, to reduce pain/ discomfort, and to improve safe ambulation and functional mobility.   Baseline dependent   Time 12   Period Weeks   Status New   OT LONG TERM GOAL #3   Title Pt >/= 85 % compliant with all daily, LE self-care protocols for home program, including simple self-manual lymphatic drainage (MLD), skin care, lymphatic pumping the ex, skin care, and donning/ doffing compression wraps and garments using assistive devices PRN (modified independence)  to limit LE progression and further functional decline.     Baseline dependent   Time 12   Period Weeks   Status New   OT LONG TERM GOAL #4   Title Pt to tolerate daily compression wraps, garments and devices in keeping w/ prescribed wear regime within 1 week of issue date to progress and retain clinical and functional gains and to limit LE progression.   Baseline dependent   Time 12   Period Weeks   Status New   OT LONG TERM GOAL #5   Title During Management Phase CDT Pt to sustain limb volume reductions achieved during Intensive Phase CDT within 5% utilizing LE self-care protocols, appropriate compression garments/ devices, and needed level of caregiver assistance.   Baseline dependent   Time 6   Period Months   Status New               Plan - 05/31/16 1614    Clinical Impression Statement After skilled education Pt able to perform entire LE ther ex sequence with VC and demonstration w/ min A. By end of session Pt able to perform J stroke using proper techniques, short neck sequence, and groin MLD w/ mod A. Will continue to teach self care each session until mastered. LLE is very swollen  today and dense. Suspect Pt did not utilize  compressin over the weekend break. Pt agrees w/ plan to complete compression garment measurments by end of week if she is able to be diligent w/ compression.   Rehab Potential Good   OT Frequency 3x / week   OT Duration 12 weeks   OT Treatment/Interventions DME and/or AE instruction;Manual lymph drainage;Patient/family education;Compression bandaging;Therapeutic exercises;Therapeutic activities;Other (comment);Manual Therapy;Self-care/ADL training  skin care   OT Home Exercise Plan such as [employer], to facilitate improved lymphatic and vascular function , and to limit fibrosis formation during HOS  Consulted and Agree with Plan of Care Patient      Patient will benefit from skilled therapeutic intervention in order to improve the following deficits and impairments:  Decreased knowledge of use of DME, Decreased skin integrity, Increased edema, Impaired flexibility, Pain, Decreased mobility, Decreased range of motion, Difficulty walking, Abnormal gait  Visit Diagnosis: Lymphedema, not elsewhere classified    Problem List Patient Active Problem List   Diagnosis Date Noted  . Frequent headaches 03/06/2016  . Microscopic hematuria 09/16/2015  . Bilateral renal cysts 09/16/2015  . Urinary frequency 09/16/2015  . Nocturia 09/16/2015  . Bilateral edema of lower extremity 09/09/2015  . Essential hypertension 09/09/2015  . Anemia 09/09/2015  . History of hematuria 09/09/2015    Andrey Spearman, MS, OTR/L, [facility] 05/31/2016 4:17 PM   [place] MAIN [facility] SERVICES [address] [place], [place], [address] Phone: [phone]   Fax:  [phone]  Name: ELISE COOP MRN: [id number] Date of Birth: 1951-01-28

## 2016-06-02 ENCOUNTER — Ambulatory Visit (INDEPENDENT_AMBULATORY_CARE_PROVIDER_SITE_OTHER): Payer: Commercial Managed Care - HMO | Admitting: Primary Care

## 2016-06-02 ENCOUNTER — Ambulatory Visit: Payer: Commercial Managed Care - HMO | Admitting: Occupational Therapy

## 2016-06-02 ENCOUNTER — Encounter: Payer: Self-pay | Admitting: Primary Care

## 2016-06-02 VITALS — BP 124/88 | HR 72 | Temp 98.1°F | Ht 64.5 in | Wt 245.8 lb

## 2016-06-02 DIAGNOSIS — R600 Localized edema: Secondary | ICD-10-CM | POA: Diagnosis not present

## 2016-06-02 NOTE — Assessment & Plan Note (Signed)
Currently undergoing treatment at lymphedema clinic. Patient referred to our office today for surgical referral. Will need to talk with occupational therapist to learn more information. Once information obtained will place referral likely to vascular surgery.

## 2016-06-02 NOTE — Progress Notes (Signed)
Subjective:    Patient ID: Ann Barker Roles, female    DOB: 03/22/1951, 65 y.o.   MRN: [id number]  HPI  Ann Barker is a 65 year old female who presents today with a chief complaint of left lower groin pain. She has a history of hysterectomy in 1981. After her surgery they had to remove additional tissue due to a post operative infection. Since then she's continued to notice left groin discomfort intermittently. Over the last several years she's eveloped moderate lower extremity edema, mostly located to the left lower extremity.  She was in occupation therapy (lymphedema clinic) last week and felt discomfort and swelling to that site. This occurred several years ago. She was told she needed a referral to a surgeon for recurrent lymphedema.   Review of Systems  Respiratory: Negative for cough and shortness of breath.   Cardiovascular: Positive for leg swelling. Negative for chest pain.  Skin: Negative for color change and wound.       Past Medical History  Diagnosis Date  . Hypertension   . Hematuria     microscopic  . Lower extremity edema   . Anemia   . Arthritis   . Acid reflux   . UTI (lower urinary tract infection)   . Hoarseness   . HLD (hyperlipidemia)   . Vitamin D deficiency   . Dizziness   . Urge incontinence   . Renal cyst   . Gallstones   . Over weight      Social History   Social History  . Marital Status: Married    Spouse Name: N/A  . Number of Children: N/A  . Years of Education: N/A   Occupational History  . Not on file.   Social History Main Topics  . Smoking status: Never Smoker   . Smokeless tobacco: Not on file  . Alcohol Use: No  . Drug Use: No  . Sexual Activity: Not on file   Other Topics Concern  . Not on file   Social History Narrative    Past Surgical History  Procedure Laterality Date  . Abdominal hysterectomy  1981  . Hernia repair  2005  . Breast biopsy  1995/2005  . Tubal ligation  1975    Family History  Problem  Relation Age of Onset  . Prostate cancer Neg Hx   . Kidney disease Neg Hx   . Bladder Cancer Neg Hx     Allergies  Allergen Reactions  . Morphine Other (See Comments)    Unknown  . Clindamycin/Lincomycin Diarrhea  . Methylprednisolone Swelling  . Codeine Palpitations and Other (See Comments)    unknown  . Morphine And Related Palpitations  . Penicillins Rash and Other (See Comments)    unknown    Current Outpatient Prescriptions on File Prior to Visit  Medication Sig Dispense Refill  . BIOTIN PO Take 1 capsule by mouth daily.    . Cholecalciferol (VITAMIN D PO) Take 50,000 Units by mouth daily.    . Multiple Vitamin (MULTIVITAMIN) capsule Take 1 capsule by mouth daily.     No current facility-administered medications on file prior to visit.    BP 124/88 mmHg  Pulse 72  Temp(Src) 98.1 F (36.7 C) (Oral)  Ht 5' 4.5" (1.638 m)  Wt 245 lb 12.8 oz (111.494 kg)  BMI 41.56 kg/m2  SpO2 98%    Objective:   Physical Exam  Constitutional: She appears well-nourished.  Cardiovascular: Normal rate and regular rhythm.   Moderate swelling to bilateral  lower extremities, more so to left lower extremity.   Pulmonary/Chest: Effort normal and breath sounds normal.  Skin: Skin is warm and dry.  Left groin without erythema, drainage or swelling.          Assessment & Plan:

## 2016-06-02 NOTE — Patient Instructions (Signed)
I will get in touch with the Occupational Therapist to discuss our next steps. I will be in touch with you tomorrow after I speak with her.  It was a pleasure to see you today!

## 2016-06-02 NOTE — Progress Notes (Signed)
Pre visit review using our clinic review tool, if applicable. No additional management support is needed unless otherwise documented below in the visit note. 

## 2016-06-03 ENCOUNTER — Ambulatory Visit: Payer: Commercial Managed Care - HMO | Admitting: Occupational Therapy

## 2016-06-03 DIAGNOSIS — I890 Lymphedema, not elsewhere classified: Secondary | ICD-10-CM

## 2016-06-03 NOTE — Therapy (Signed)
[place] MAIN [facility] SERVICES [address] [place], [place], [address] Phone: [phone]   Fax:  [phone]  Occupational Therapy Treatment  Patient Details  Name: Ann Barker MRN: [id number] Date of Birth: Jan 09, 1951 No Data Recorded  Encounter Date: 06/03/2016      OT End of Session - 06/03/16 1653    Visit Number 15   Number of Visits 36   Date for OT Re-Evaluation 07/20/16   OT Start Time 0326   OT Stop Time 0427   OT Time Calculation (min) 61 min      Past Medical History  Diagnosis Date  . Hypertension   . Hematuria     microscopic  . Lower extremity edema   . Anemia   . Arthritis   . Acid reflux   . UTI (lower urinary tract infection)   . Hoarseness   . HLD (hyperlipidemia)   . Vitamin D deficiency   . Dizziness   . Urge incontinence   . Renal cyst   . Gallstones   . Over weight     Past Surgical History  Procedure Laterality Date  . Abdominal hysterectomy  1981  . Hernia repair  2005  . Breast biopsy  1995/2005  . Tubal ligation  1975    There were no vitals filed for this visit.      Subjective Assessment - 06/03/16 1650    Subjective  Pt presents for OT visit 15 to address BLE lymphedema. Pt has no new complaints. Pt states she has been practicing exercises and self MLD learned at last visit.   Pertinent History 2014 & 2015 B Laser vein ablation; Keller arthroplasty R 1st metatarsophalangeal joint 03/12/16. Reported hx of serious L groin infection and cellulitis s/p hysterectomy several years ago; BLE vein ablation several years ago by report;;HTN   Limitations difficulty walking, difficulty w/ extended standing and sitting, difficulty fitting LB clothing and street shoes   Patient Stated Goals reduce swelling and leg pain to be able to do more   Currently in Pain? No/denies   Pain Onset More than a month ago                      OT Treatments/Exercises (OP) - 06/03/16 0001    ADLs    ADL Education Given Yes   Manual Therapy   Manual Therapy Edema management;Manual Lymphatic Drainage (MLD);Compression Bandaging;Other (comment)   Edema Management Skin care w/  skin grade, low pH castor oil   Manual Lymphatic Drainage (MLD) Manual lymph drainage (MLD) to LLE in supine utilizing functional inguinal lymph nodes and deep abdominal lymphatics as is customary for non-cancer related lower extremity LE, including bilateral "short neck" sequence, J strokes to sub and supraclavicular LN, deep abdominal pathways, functional inguinal LN, lower extremity proximal to distal w/ emphasis on medial knee bottleneck and politeal LN. Performed fibrosis technique to B maleoli and distal  legs to address fatty fibrosis. Good tolerance.   Compression Bandaging Modified medial and lateral maleoli pads custom  fabricated from [employer] dot grid padding by adding single layer of gray foam on outer surfaces.  LLE gradient compression wraps applied from toes to below knee: toe wrap x1 under cotton stockinett; 8 cm x 5 m x 1 to foot and ankle, 10 cm  x 5 m x 2, 12 cm x5 cm x 1, applied circumferentially in custommary layered gradient configuration  over .04 x 10 cm x 5 m Rosidol Soft  x 1.5 .rolls.  Will modify to A-G PRN                OT Education - 06/03/16 1652    Education provided Yes   Education Details Continues Pt edu for simple self MLD.    Person(s) Educated Patient   Methods Explanation;Demonstration;Verbal cues   Comprehension Verbalized understanding;Returned demonstration;Verbal cues required;Need further instruction             OT Long Term Goals - 04/23/16 1617    OT LONG TERM GOAL #1   Title Lymphedema (LE) self-care: Pt able to apply multi layered, gradient compression wraps independently using proper techniques within 2 weeks to achieve optimal limb volume reduction.   Baseline dependent   Time 2   Period Weeks   Status New   OT LONG TERM GOAL #2   Title Lymphedema (LE)  self-care:  Pt to achieve at least 10% BLE limb volume reductions during Intensive CDT to limit LE progression, decrease infection risk, to reduce pain/ discomfort, and to improve safe ambulation and functional mobility.   Baseline dependent   Time 12   Period Weeks   Status New   OT LONG TERM GOAL #3   Title Pt >/= 85 % compliant with all daily, LE self-care protocols for home program, including simple self-manual lymphatic drainage (MLD), skin care, lymphatic pumping the ex, skin care, and donning/ doffing compression wraps and garments using assistive devices PRN (modified independence)  to limit LE progression and further functional decline.     Baseline dependent   Time 12   Period Weeks   Status New   OT LONG TERM GOAL #4   Title Pt to tolerate daily compression wraps, garments and devices in keeping w/ prescribed wear regime within 1 week of issue date to progress and retain clinical and functional gains and to limit LE progression.   Baseline dependent   Time 12   Period Weeks   Status New   OT LONG TERM GOAL #5   Title During Management Phase CDT Pt to sustain limb volume reductions achieved during Intensive Phase CDT within 5% utilizing LE self-care protocols, appropriate compression garments/ devices, and needed level of caregiver assistance.   Baseline dependent   Time 6   Period Months   Status New               Plan - 06/03/16 1654    Clinical Impression Statement LLE is dense and tight at ankle again today. Suspect Pt is somewhat less compliant w/ compression between sessions than directed. Will complete volumetrics tomorrow and discuss progress towards goals.   Rehab Potential Good   OT Frequency 3x / week   OT Duration 12 weeks   OT Treatment/Interventions DME and/or AE instruction;Manual lymph drainage;Patient/family education;Compression bandaging;Therapeutic exercises;Therapeutic activities;Other (comment);Manual Therapy;Self-care/ADL training  skin care    OT Home Exercise Plan such as [employer], to facilitate improved lymphatic and vascular function , and to limit fibrosis formation during HOS   Consulted and Agree with Plan of Care Patient      Patient will benefit from skilled therapeutic intervention in order to improve the following deficits and impairments:  Decreased knowledge of use of DME, Decreased skin integrity, Increased edema, Impaired flexibility, Pain, Decreased mobility, Decreased range of motion, Difficulty walking, Abnormal gait  Visit Diagnosis: Lymphedema, not elsewhere classified    Problem List Patient Active Problem List   Diagnosis Date Noted  . Frequent headaches 03/06/2016  . Microscopic hematuria  09/16/2015  . Bilateral renal cysts 09/16/2015  . Urinary frequency 09/16/2015  . Nocturia 09/16/2015  . Bilateral edema of lower extremity 09/09/2015  . Essential hypertension 09/09/2015  . Anemia 09/09/2015  . History of hematuria 09/09/2015    Andrey Spearman, MS, OTR/L, [facility] 06/03/2016 4:57 PM  [place] MAIN [facility] SERVICES [address] [place], [place], [address] Phone: [phone]   Fax:  [phone]  Name: Ann Barker MRN: [id number] Date of Birth: Jul 17, 1951

## 2016-06-03 NOTE — Patient Instructions (Signed)
LE instructions and precautions as established- see initial eval.   

## 2016-06-04 ENCOUNTER — Ambulatory Visit: Payer: Commercial Managed Care - HMO | Admitting: Occupational Therapy

## 2016-06-04 DIAGNOSIS — I890 Lymphedema, not elsewhere classified: Secondary | ICD-10-CM

## 2016-06-04 NOTE — Therapy (Signed)
[place] MAIN [facility] SERVICES [address] [place], [place], [address] Phone: [phone]   Fax:  [phone]  Occupational Therapy Treatment  Patient Details  Name: Ann Barker MRN: [id number] Date of Birth: 10/31/1951 No Data Recorded  Encounter Date: 06/04/2016      OT End of Session - 06/04/16 1520    Visit Number 16   Number of Visits 36   Date for OT Re-Evaluation 07/20/16   OT Start Time 1120   OT Stop Time 1230   OT Time Calculation (min) 70 min      Past Medical History  Diagnosis Date  . Hypertension   . Hematuria     microscopic  . Lower extremity edema   . Anemia   . Arthritis   . Acid reflux   . UTI (lower urinary tract infection)   . Hoarseness   . HLD (hyperlipidemia)   . Vitamin D deficiency   . Dizziness   . Urge incontinence   . Renal cyst   . Gallstones   . Over weight     Past Surgical History  Procedure Laterality Date  . Abdominal hysterectomy  1981  . Hernia repair  2005  . Breast biopsy  1995/2005  . Tubal ligation  1975    There were no vitals filed for this visit.      Subjective Assessment - 06/04/16 1517    Subjective  Pt presents for OT visit 16 to address BLE lymphedema. Pt is very tired today as she reports she did not sleep well last night.   Pertinent History 2014 & 2015 B Laser vein ablation; Keller arthroplasty R 1st metatarsophalangeal joint 03/12/16. Reported hx of serious L groin infection and cellulitis s/p hysterectomy several years ago; BLE vein ablation several years ago by report;;HTN   Limitations difficulty walking, difficulty w/ extended standing and sitting, difficulty fitting LB clothing and street shoes   Patient Stated Goals reduce swelling and leg pain to be able to do more   Currently in Pain? Yes   Pain Score --  not numerically rated   Pain Location Foot   Pain Orientation Left   Pain Type Acute pain   Pain Onset More than a month ago              LYMPHEDEMA/ONCOLOGY QUESTIONNAIRE - 06/04/16 1521    Right Lower Extremity Lymphedema   Other RLE below knee (A-D) volume =4733.74 ml; Ankle to groin (A-G) vol = 10339.65 ml   Other RLE Limb volume differential(LVD) : A-G 1.0%, R>L   Left Lower Extremity Lymphedema   Other LLE A-D volume = 5305.52 ml; A-G volume = 11058.17 ml. AD volume is decreased by 5.43% since last measured on 05/14/16 and by 10.42% overall. (GOAL MET for A-D). A-G volume is decreased by 3.2 % since last measured on 6/2, and overall reduction since commencing OT for CDT on 04/23/16 measures 5.1%                  OT Treatments/Exercises (OP) - 06/04/16 0001    Manual Therapy   Manual Therapy Edema management;Manual Lymphatic Drainage (MLD);Compression Bandaging;Other (comment)   Edema Management Skin care w/  skin grade, low pH castor oil   Manual Lymphatic Drainage (MLD) Manual lymph drainage (MLD) to LLE in supine utilizing functional inguinal lymph nodes and deep abdominal lymphatics as is customary for non-cancer related lower extremity LE, including bilateral "short neck" sequence, J strokes to sub and supraclavicular LN,  deep abdominal pathways, functional inguinal LN, lower extremity proximal to distal w/ emphasis on medial knee bottleneck and politeal LN. Performed fibrosis technique to B maleoli and distal  legs to address fatty fibrosis. Good tolerance.   Other Manual Therapy Completed BLE comparative volumetrics                OT Education - 06/04/16 1519    Education provided Yes   Education Details No Pt edu today due to Pt feeling unwell. simplified session to limit stress   Person(s) Educated Patient             OT Long Term Goals - 04/23/16 1617    OT LONG TERM GOAL #1   Title Lymphedema (LE) self-care: Pt able to apply multi layered, gradient compression wraps independently using proper techniques within 2 weeks to achieve optimal limb volume reduction.   Baseline dependent   Time  2   Period Weeks   Status New   OT LONG TERM GOAL #2   Title Lymphedema (LE) self-care:  Pt to achieve at least 10% BLE limb volume reductions during Intensive CDT to limit LE progression, decrease infection risk, to reduce pain/ discomfort, and to improve safe ambulation and functional mobility.   Baseline dependent   Time 12   Period Weeks   Status New   OT LONG TERM GOAL #3   Title Pt >/= 85 % compliant with all daily, LE self-care protocols for home program, including simple self-manual lymphatic drainage (MLD), skin care, lymphatic pumping the ex, skin care, and donning/ doffing compression wraps and garments using assistive devices PRN (modified independence)  to limit LE progression and further functional decline.     Baseline dependent   Time 12   Period Weeks   Status New   OT LONG TERM GOAL #4   Title Pt to tolerate daily compression wraps, garments and devices in keeping w/ prescribed wear regime within 1 week of issue date to progress and retain clinical and functional gains and to limit LE progression.   Baseline dependent   Time 12   Period Weeks   Status New   OT LONG TERM GOAL #5   Title During Management Phase CDT Pt to sustain limb volume reductions achieved during Intensive Phase CDT within 5% utilizing LE self-care protocols, appropriate compression garments/ devices, and needed level of caregiver assistance.   Baseline dependent   Time 6   Period Months   Status New               Plan - 06/04/16 1531    Clinical Impression Statement Comparative BLE limb volumetrics reveals measurable limb volume reductions at both knee and groin landmarks as a result of treatment to the LLE and secondary stimulation to contralateral lymphatics. Overall reduction at knee and groin measure 8.38% and 7.42% respectively. LLE is decreased since last measured on 6/2 by 5.43% at the ankle - knee landmark, and by 3.24% at ankle-groin landmark. Overall reduction in LLE are with goal  range at knee with 10.42% decrease, and at 5.1%  from ankle to groin. Pt agrees with plan to  complete anatomical measurements for LLE custom compression garment  at next visit.   Rehab Potential Good   OT Frequency 3x / week   OT Duration 12 weeks   OT Treatment/Interventions DME and/or AE instruction;Manual lymph drainage;Patient/family education;Compression bandaging;Therapeutic exercises;Therapeutic activities;Other (comment);Manual Therapy;Self-care/ADL training  skin care   OT Home Exercise Plan such as [employer], to facilitate improved  lymphatic and vascular function , and to limit fibrosis formation during HOS   Consulted and Agree with Plan of Care Patient      Patient will benefit from skilled therapeutic intervention in order to improve the following deficits and impairments:  Decreased knowledge of use of DME, Decreased skin integrity, Increased edema, Impaired flexibility, Pain, Decreased mobility, Decreased range of motion, Difficulty walking, Abnormal gait  Visit Diagnosis: Lymphedema, not elsewhere classified    Problem List Patient Active Problem List   Diagnosis Date Noted  . Frequent headaches 03/06/2016  . Microscopic hematuria 09/16/2015  . Bilateral renal cysts 09/16/2015  . Urinary frequency 09/16/2015  . Nocturia 09/16/2015  . Bilateral edema of lower extremity 09/09/2015  . Essential hypertension 09/09/2015  . Anemia 09/09/2015  . History of hematuria 09/09/2015     Andrey Spearman, MS, OTR/L, [facility] 06/04/2016 3:37 PM  [place] MAIN [facility] SERVICES [address] [place], [place], [address] Phone: [phone]   Fax:  [phone]  Name: Ann Barker MRN: [id number] Date of Birth: Feb 02, 1951

## 2016-06-04 NOTE — Patient Instructions (Signed)
LE instructions and precautions as established- see initial eval.   

## 2016-06-07 ENCOUNTER — Ambulatory Visit: Payer: Commercial Managed Care - HMO | Admitting: Occupational Therapy

## 2016-06-09 ENCOUNTER — Ambulatory Visit: Payer: Commercial Managed Care - HMO | Admitting: Occupational Therapy

## 2016-06-11 ENCOUNTER — Ambulatory Visit: Payer: Commercial Managed Care - HMO | Admitting: Occupational Therapy

## 2016-06-14 ENCOUNTER — Ambulatory Visit: Payer: Commercial Managed Care - HMO | Attending: Primary Care | Admitting: Occupational Therapy

## 2016-06-14 DIAGNOSIS — I890 Lymphedema, not elsewhere classified: Secondary | ICD-10-CM | POA: Diagnosis not present

## 2016-06-14 NOTE — Patient Instructions (Signed)
LE instructions and precautions as established- see initial eval.   

## 2016-06-14 NOTE — Therapy (Signed)
[place] MAIN [facility] SERVICES [address] [place], [place], [address] Phone: [phone]   Fax:  [phone]  Occupational Therapy Treatment  Patient Details  Name: GEORGIANN FURIO MRN: [id number] Date of Birth: 03-23-1951 No Data Recorded  Encounter Date: 06/14/2016      OT End of Session - 06/14/16 1618    Visit Number 17   Number of Visits 36   Date for OT Re-Evaluation 07/20/16   OT Start Time 0305   OT Stop Time 0416   OT Time Calculation (min) 71 min      Past Medical History  Diagnosis Date  . Hypertension   . Hematuria     microscopic  . Lower extremity edema   . Anemia   . Arthritis   . Acid reflux   . UTI (lower urinary tract infection)   . Hoarseness   . HLD (hyperlipidemia)   . Vitamin D deficiency   . Dizziness   . Urge incontinence   . Renal cyst   . Gallstones   . Over weight     Past Surgical History  Procedure Laterality Date  . Abdominal hysterectomy  1981  . Hernia repair  2005  . Breast biopsy  1995/2005  . Tubal ligation  1975    There were no vitals filed for this visit.      Subjective Assessment - 06/14/16 1559    Subjective  Pt presents for OT visit 16 to address BLE lymphedema. Pt is not wearing compression wraps and B legs feet and ankles are swollen to pretreatment level.   Pertinent History 2014 & 2015 B Laser vein ablation; Keller arthroplasty R 1st metatarsophalangeal joint 03/12/16. Reported hx of serious L groin infection and cellulitis s/p hysterectomy several years ago; BLE vein ablation several years ago by report;;HTN   Limitations difficulty walking, difficulty w/ extended standing and sitting, difficulty fitting LB clothing and street shoes   Patient Stated Goals reduce swelling and leg pain to be able to do more   Currently in Pain? No/denies   Pain Onset More than a month ago                      OT Treatments/Exercises (OP) - 06/14/16 0001    ADLs   ADL  Education Given Yes   Manual Therapy   Manual Therapy Edema management;Manual Lymphatic Drainage (MLD);Compression Bandaging;Other (comment)   Edema Management Skin care w/  skin grade, low pH castor oil   Manual Lymphatic Drainage (MLD) Manual lymph drainage (MLD) to LLE in supine utilizing functional inguinal lymph nodes and deep abdominal lymphatics as is customary for non-cancer related lower extremity LE, including bilateral "short neck" sequence, J strokes to sub and supraclavicular LN, deep abdominal pathways, functional inguinal LN, lower extremity proximal to distal w/ emphasis on medial knee bottleneck and politeal LN. Performed fibrosis technique to B maleoli and distal  legs to address fatty fibrosis. Good tolerance.                OT Education - 06/14/16 1618    Education provided Yes   Education Details Continued skilled Pt/caregiver Education  And LE ADL training throughout visit for lymphedema self care, including compression wrapping, compression garment and device wear/care, lymphatic pumping ther ex, simple self-MLD, and skin care. Discussed progress towards goals.   Person(s) Educated Patient   Methods Explanation             OT Long Term  Goals - 04/23/16 1617    OT LONG TERM GOAL #1   Title Lymphedema (LE) self-care: Pt able to apply multi layered, gradient compression wraps independently using proper techniques within 2 weeks to achieve optimal limb volume reduction.   Baseline dependent   Time 2   Period Weeks   Status New   OT LONG TERM GOAL #2   Title Lymphedema (LE) self-care:  Pt to achieve at least 10% BLE limb volume reductions during Intensive CDT to limit LE progression, decrease infection risk, to reduce pain/ discomfort, and to improve safe ambulation and functional mobility.   Baseline dependent   Time 12   Period Weeks   Status New   OT LONG TERM GOAL #3   Title Pt >/= 85 % compliant with all daily, LE self-care protocols for home program,  including simple self-manual lymphatic drainage (MLD), skin care, lymphatic pumping the ex, skin care, and donning/ doffing compression wraps and garments using assistive devices PRN (modified independence)  to limit LE progression and further functional decline.     Baseline dependent   Time 12   Period Weeks   Status New   OT LONG TERM GOAL #4   Title Pt to tolerate daily compression wraps, garments and devices in keeping w/ prescribed wear regime within 1 week of issue date to progress and retain clinical and functional gains and to limit LE progression.   Baseline dependent   Time 12   Period Weeks   Status New   OT LONG TERM GOAL #5   Title During Management Phase CDT Pt to sustain limb volume reductions achieved during Intensive Phase CDT within 5% utilizing LE self-care protocols, appropriate compression garments/ devices, and needed level of caregiver assistance.   Baseline dependent   Time 6   Period Months   Status New               Plan - 06/14/16 1619    Clinical Impression Statement Legs are too swollen today to complete compression garment measurements as planned. It appears that Pt has not worn recommended compressin in several days based on current swelling. Enouraged Pt to continue with diligent LE self care. Pt agrees that we'll measure for garments next visit and submit for charitable funding.   Rehab Potential Good   OT Frequency 3x / week   OT Duration 12 weeks   OT Treatment/Interventions DME and/or AE instruction;Manual lymph drainage;Patient/family education;Compression bandaging;Therapeutic exercises;Therapeutic activities;Other (comment);Manual Therapy;Self-care/ADL training  skin care   OT Home Exercise Plan such as [employer], to facilitate improved lymphatic and vascular function , and to limit fibrosis formation during HOS   Consulted and Agree with Plan of Care Patient      Patient will benefit from skilled therapeutic intervention in order to  improve the following deficits and impairments:  Decreased knowledge of use of DME, Decreased skin integrity, Increased edema, Impaired flexibility, Pain, Decreased mobility, Decreased range of motion, Difficulty walking, Abnormal gait  Visit Diagnosis: Lymphedema, not elsewhere classified    Problem List Patient Active Problem List   Diagnosis Date Noted  . Frequent headaches 03/06/2016  . Microscopic hematuria 09/16/2015  . Bilateral renal cysts 09/16/2015  . Urinary frequency 09/16/2015  . Nocturia 09/16/2015  . Bilateral edema of lower extremity 09/09/2015  . Essential hypertension 09/09/2015  . Anemia 09/09/2015  . History of hematuria 09/09/2015    Andrey Spearman, MS, OTR/L, [facility] 06/14/2016 4:23 PM  [place] [facility] SERVICES [address]  [place], [place], [address] Phone: [phone]   Fax:  [phone]  Name: DESHAUN BIVIANO MRN: [id number] Date of Birth: 06-10-1951

## 2016-06-16 ENCOUNTER — Ambulatory Visit: Payer: Commercial Managed Care - HMO | Admitting: Occupational Therapy

## 2016-06-16 ENCOUNTER — Telehealth: Payer: Self-pay | Admitting: Primary Care

## 2016-06-16 DIAGNOSIS — I890 Lymphedema, not elsewhere classified: Secondary | ICD-10-CM

## 2016-06-16 NOTE — Telephone Encounter (Signed)
Please notify Ann Barker that I heard back from Clarene Critchley from occupational therapy regarding her concerns. Clarene Critchley believes she is making progress and hopes to have custom made hose completed soon.  If Ann Barker would like further assessment of her swelling, then we can set her up with a vascular surgeon, otherwise OT believes she's improving. Let me know!

## 2016-06-17 NOTE — Telephone Encounter (Signed)
Called patient back. Notified patient of Kate's comments. Patient stated she would to be set up with a vascular surgeon because of swelling when unwrapping.

## 2016-06-17 NOTE — Telephone Encounter (Signed)
Message left for patient to return my call.  

## 2016-06-17 NOTE — Telephone Encounter (Signed)
Noted  Referral placed.

## 2016-06-17 NOTE — Telephone Encounter (Signed)
Patient returned Chan's call.  Patient can be reached at [phone].

## 2016-06-18 ENCOUNTER — Ambulatory Visit: Payer: Commercial Managed Care - HMO | Admitting: Occupational Therapy

## 2016-06-18 DIAGNOSIS — I890 Lymphedema, not elsewhere classified: Secondary | ICD-10-CM

## 2016-06-18 NOTE — Therapy (Signed)
[place] MAIN [facility] SERVICES [address] [place], [place], [address] Phone: [phone]   Fax:  [phone]  Occupational Therapy Treatment  Patient Details  Name: Ann Barker MRN: [id number] Date of Birth: 10-Nov-1951 No Data Recorded  Encounter Date: 06/18/2016      OT End of Session - 06/18/16 1540    Visit Number 17   Number of Visits 36   Date for OT Re-Evaluation 07/20/16   OT Start Time 0118   OT Stop Time 0215   OT Time Calculation (min) 57 min      Past Medical History  Diagnosis Date  . Hypertension   . Hematuria     microscopic  . Lower extremity edema   . Anemia   . Arthritis   . Acid reflux   . UTI (lower urinary tract infection)   . Hoarseness   . HLD (hyperlipidemia)   . Vitamin D deficiency   . Dizziness   . Urge incontinence   . Renal cyst   . Gallstones   . Over weight     Past Surgical History  Procedure Laterality Date  . Abdominal hysterectomy  1981  . Hernia repair  2005  . Breast biopsy  1995/2005  . Tubal ligation  1975    There were no vitals filed for this visit.      Subjective Assessment - 06/18/16 1537    Subjective  Pt presents for OT visit 17 to address BLE lymphedema. Pt presents wearing LLE compression wraps. Pt is tired today and doses intermittently during treatment.   Pertinent History 2014 & 2015 B Laser vein ablation; Keller arthroplasty R 1st metatarsophalangeal joint 03/12/16. Reported hx of serious L groin infection and cellulitis s/p hysterectomy several years ago; BLE vein ablation several years ago by report;;HTN   Limitations difficulty walking, difficulty w/ extended standing and sitting, difficulty fitting LB clothing and street shoes   Patient Stated Goals reduce swelling and leg pain to be able to do more   Currently in Pain? No/denies                      OT Treatments/Exercises (OP) - 06/18/16 0001    ADLs   ADL Education Given --   Manual  Therapy   Manual Therapy Edema management;Compression Bandaging   Manual therapy comments Anatomical measurements of BLE for custom compression garments                OT Education - 06/18/16 1540    Education provided No             OT Long Term Goals - 04/23/16 1617    OT LONG TERM GOAL #1   Title Lymphedema (LE) self-care: Pt able to apply multi layered, gradient compression wraps independently using proper techniques within 2 weeks to achieve optimal limb volume reduction.   Baseline dependent   Time 2   Period Weeks   Status New   OT LONG TERM GOAL #2   Title Lymphedema (LE) self-care:  Pt to achieve at least 10% BLE limb volume reductions during Intensive CDT to limit LE progression, decrease infection risk, to reduce pain/ discomfort, and to improve safe ambulation and functional mobility.   Baseline dependent   Time 12   Period Weeks   Status New   OT LONG TERM GOAL #3   Title Pt >/= 85 % compliant with all daily, LE self-care protocols for home program, including simple self-manual  lymphatic drainage (MLD), skin care, lymphatic pumping the ex, skin care, and donning/ doffing compression wraps and garments using assistive devices PRN (modified independence)  to limit LE progression and further functional decline.     Baseline dependent   Time 12   Period Weeks   Status New   OT LONG TERM GOAL #4   Title Pt to tolerate daily compression wraps, garments and devices in keeping w/ prescribed wear regime within 1 week of issue date to progress and retain clinical and functional gains and to limit LE progression.   Baseline dependent   Time 12   Period Weeks   Status New   OT LONG TERM GOAL #5   Title During Management Phase CDT Pt to sustain limb volume reductions achieved during Intensive Phase CDT within 5% utilizing LE self-care protocols, appropriate compression garments/ devices, and needed level of caregiver assistance.   Baseline dependent   Time 6    Period Months   Status New               Plan - 06/18/16 1540    Clinical Impression Statement Pt tired today and was not as engaged in session as much as usual. Compliance is slipping on visit interval self care. Completed BLE anatomical measurements for custom, knee length, Elvarex Soft, ccl 3, open toe compression garments. Pt tolerated treatment well. Once garments are fit we'll decrease frequency to 1 mo F/U.   Rehab Potential Good   OT Frequency 3x / week   OT Duration 12 weeks   OT Treatment/Interventions DME and/or AE instruction;Manual lymph drainage;Patient/family education;Compression bandaging;Therapeutic exercises;Therapeutic activities;Other (comment);Manual Therapy;Self-care/ADL training  skin care   OT Home Exercise Plan such as [employer], to facilitate improved lymphatic and vascular function , and to limit fibrosis formation during HOS   Consulted and Agree with Plan of Care Patient      Patient will benefit from skilled therapeutic intervention in order to improve the following deficits and impairments:  Decreased knowledge of use of DME, Decreased skin integrity, Increased edema, Impaired flexibility, Pain, Decreased mobility, Decreased range of motion, Difficulty walking, Abnormal gait  Visit Diagnosis: Lymphedema, not elsewhere classified    Problem List Patient Active Problem List   Diagnosis Date Noted  . Frequent headaches 03/06/2016  . Microscopic hematuria 09/16/2015  . Bilateral renal cysts 09/16/2015  . Urinary frequency 09/16/2015  . Nocturia 09/16/2015  . Bilateral edema of lower extremity 09/09/2015  . Essential hypertension 09/09/2015  . Anemia 09/09/2015  . History of hematuria 09/09/2015    Andrey Spearman, MS, OTR/L, [facility] 06/18/2016 3:44 PM  [place] MAIN [facility] SERVICES [address] [place], [place], [address] Phone: [phone]   Fax:  [phone]  Name: Ann Barker MRN:  [id number] Date of Birth: 03/17/1951

## 2016-06-18 NOTE — Patient Instructions (Signed)
LE instructions and precautions as established- see initial eval.   

## 2016-06-21 ENCOUNTER — Ambulatory Visit: Payer: Commercial Managed Care - HMO | Admitting: Occupational Therapy

## 2016-06-23 ENCOUNTER — Ambulatory Visit: Payer: Commercial Managed Care - HMO | Admitting: Occupational Therapy

## 2016-06-23 DIAGNOSIS — I890 Lymphedema, not elsewhere classified: Secondary | ICD-10-CM

## 2016-06-23 NOTE — Patient Instructions (Signed)
LE instructions and precautions as established- see initial eval.   

## 2016-06-23 NOTE — Therapy (Signed)
[place] MAIN [facility] SERVICES [address] [place], [place], [address] Phone: [phone]   Fax:  [phone]  Occupational Therapy Treatment  Patient Details  Name: Ann Barker MRN: [id number] Date of Birth: 18-Aug-1951 No Data Recorded  Encounter Date: 06/23/2016      OT End of Session - 06/23/16 1618    Visit Number 18   Number of Visits 36   Date for OT Re-Evaluation 07/20/16   OT Start Time 0335   OT Stop Time 0410   OT Time Calculation (min) 35 min      Past Medical History  Diagnosis Date  . Hypertension   . Hematuria     microscopic  . Lower extremity edema   . Anemia   . Arthritis   . Acid reflux   . UTI (lower urinary tract infection)   . Hoarseness   . HLD (hyperlipidemia)   . Vitamin D deficiency   . Dizziness   . Urge incontinence   . Renal cyst   . Gallstones   . Over weight     Past Surgical History  Procedure Laterality Date  . Abdominal hysterectomy  1981  . Hernia repair  2005  . Breast biopsy  1995/2005  . Tubal ligation  1975    There were no vitals filed for this visit.      Subjective Assessment - 06/23/16 1614    Subjective  Pt presents for OT visit 18 to address BLE lymphedema. Pt > 30 minutes late for 60 min appointment.  Pt presents without LLE compression wraps. Pt in better spirits today. Pt aware that invoice for compression garments has been submitted for consideration of charitable domation . Once approved we will move forward with  fitting.   Pertinent History 2014 & 2015 B Laser vein ablation; Keller arthroplasty R 1st metatarsophalangeal joint 03/12/16. Reported hx of serious L groin infection and cellulitis s/p hysterectomy several years ago; BLE vein ablation several years ago by report;;HTN   Limitations difficulty walking, difficulty w/ extended standing and sitting, difficulty fitting LB clothing and street shoes   Patient Stated Goals reduce swelling and leg pain to be able to  do more   Currently in Pain? Yes  not rated numerically   Pain Onset More than a month ago                      OT Treatments/Exercises (OP) - 06/23/16 0001    Manual Therapy   Manual Therapy Edema management;Compression Bandaging   Edema Management Skin care w/  skin grade, low pH castor oil   Manual Lymphatic Drainage (MLD) Manual lymph drainage (MLD) to LLE in supine utilizing functional inguinal lymph nodes and deep abdominal lymphatics as is customary for non-cancer related lower extremity LE, including bilateral "short neck" sequence, J strokes to sub and supraclavicular LN, deep abdominal pathways, functional inguinal LN, lower extremity proximal to distal w/ emphasis on medial knee bottleneck and politeal LN. Performed fibrosis technique to B maleoli and distal  legs to address fatty fibrosis. Good tolerance.   Compression Bandaging RLE wrapped today as established for LLE.                OT Education - 06/23/16 1618    Education provided No  2/2 time constraints             OT Long Term Goals - 04/23/16 1617    OT LONG TERM GOAL #1  Title Lymphedema (LE) self-care: Pt able to apply multi layered, gradient compression wraps independently using proper techniques within 2 weeks to achieve optimal limb volume reduction.   Baseline dependent   Time 2   Period Weeks   Status New   OT LONG TERM GOAL #2   Title Lymphedema (LE) self-care:  Pt to achieve at least 10% BLE limb volume reductions during Intensive CDT to limit LE progression, decrease infection risk, to reduce pain/ discomfort, and to improve safe ambulation and functional mobility.   Baseline dependent   Time 12   Period Weeks   Status New   OT LONG TERM GOAL #3   Title Pt >/= 85 % compliant with all daily, LE self-care protocols for home program, including simple self-manual lymphatic drainage (MLD), skin care, lymphatic pumping the ex, skin care, and donning/ doffing compression wraps and  garments using assistive devices PRN (modified independence)  to limit LE progression and further functional decline.     Baseline dependent   Time 12   Period Weeks   Status New   OT LONG TERM GOAL #4   Title Pt to tolerate daily compression wraps, garments and devices in keeping w/ prescribed wear regime within 1 week of issue date to progress and retain clinical and functional gains and to limit LE progression.   Baseline dependent   Time 12   Period Weeks   Status New   OT LONG TERM GOAL #5   Title During Management Phase CDT Pt to sustain limb volume reductions achieved during Intensive Phase CDT within 5% utilizing LE self-care protocols, appropriate compression garments/ devices, and needed level of caregiver assistance.   Baseline dependent   Time 6   Period Months   Status New               Plan - 06/23/16 1619    Clinical Impression Statement Limb volume reduction and tissue decongestion has reached clinical plateau. Goal at this stage of transition from INtensive to Management Phase CDT is to retain clinical gains while awaiting garment  funding and fitting. Estimate 2 - 3 weeks. Cont Pt support according to POC.   Rehab Potential Good   OT Frequency 3x / week   OT Duration 12 weeks   OT Treatment/Interventions DME and/or AE instruction;Manual lymph drainage;Patient/family education;Compression bandaging;Therapeutic exercises;Therapeutic activities;Other (comment);Manual Therapy;Self-care/ADL training  skin care   OT Home Exercise Plan such as [employer], to facilitate improved lymphatic and vascular function , and to limit fibrosis formation during HOS   Consulted and Agree with Plan of Care Patient      Patient will benefit from skilled therapeutic intervention in order to improve the following deficits and impairments:  Decreased knowledge of use of DME, Decreased skin integrity, Increased edema, Impaired flexibility, Pain, Decreased mobility, Decreased range  of motion, Difficulty walking, Abnormal gait  Visit Diagnosis: Lymphedema, not elsewhere classified    Problem List Patient Active Problem List   Diagnosis Date Noted  . Frequent headaches 03/06/2016  . Microscopic hematuria 09/16/2015  . Bilateral renal cysts 09/16/2015  . Urinary frequency 09/16/2015  . Nocturia 09/16/2015  . Bilateral edema of lower extremity 09/09/2015  . Essential hypertension 09/09/2015  . Anemia 09/09/2015  . History of hematuria 09/09/2015    Andrey Spearman, MS, OTR/L, [facility] 06/23/2016 4:21 PM  [place] MAIN [facility] SERVICES [address] [place], [place], [address] Phone: [phone]   Fax:  [phone]  Name: ALISI HOVEY MRN: [id number]  Date of Birth: [birth year]

## 2016-06-25 ENCOUNTER — Ambulatory Visit: Payer: Commercial Managed Care - HMO | Admitting: Occupational Therapy

## 2016-06-25 DIAGNOSIS — I890 Lymphedema, not elsewhere classified: Secondary | ICD-10-CM | POA: Diagnosis not present

## 2016-06-25 NOTE — Therapy (Signed)
[place] MAIN [facility] SERVICES [address] [place], [place], [address] Phone: [phone]   Fax:  [phone]  Occupational Therapy Treatment  Patient Details  Name: Ann Barker MRN: [id number] Date of Birth: 1950-12-28 No Data Recorded  Encounter Date: 06/25/2016      OT End of Session - 06/25/16 1209    Visit Number 19   Number of Visits 36   Date for OT Re-Evaluation 07/20/16   OT Start Time 1015   OT Stop Time 1106   OT Time Calculation (min) 51 min      Past Medical History  Diagnosis Date  . Hypertension   . Hematuria     microscopic  . Lower extremity edema   . Anemia   . Arthritis   . Acid reflux   . UTI (lower urinary tract infection)   . Hoarseness   . HLD (hyperlipidemia)   . Vitamin D deficiency   . Dizziness   . Urge incontinence   . Renal cyst   . Gallstones   . Over weight     Past Surgical History  Procedure Laterality Date  . Abdominal hysterectomy  1981  . Hernia repair  2005  . Breast biopsy  1995/2005  . Tubal ligation  1975    There were no vitals filed for this visit.      Subjective Assessment - 06/25/16 1207    Subjective  Pt presents for OT visit 19 to address BLE lymphedema. Pt 15 minutes late for 60 min appointment.  Pt presents without LLE compression wraps. Pt pleased to learn that [facility] will assist her with compression garment funding.   Pertinent History 2014 & 2015 B Laser vein ablation; Keller arthroplasty R 1st metatarsophalangeal joint 03/12/16. Reported hx of serious L groin infection and cellulitis s/p hysterectomy several years ago; BLE vein ablation several years ago by report;;HTN   Limitations difficulty walking, difficulty w/ extended standing and sitting, difficulty fitting LB clothing and street shoes   Patient Stated Goals reduce swelling and leg pain to be able to do more   Pain Onset More than a month ago                      OT  Treatments/Exercises (OP) - 06/25/16 0001    ADLs   ADL Education Given Yes   Manual Therapy   Manual Therapy Edema management;Compression Bandaging   Edema Management Skin care w/  skin grade, low pH castor oil   Manual Lymphatic Drainage (MLD) Manual lymph drainage (MLD) to LLE in supine utilizing functional inguinal lymph nodes and deep abdominal lymphatics as is customary for non-cancer related lower extremity LE, including bilateral "short neck" sequence, J strokes to sub and supraclavicular LN, deep abdominal pathways, functional inguinal LN, lower extremity proximal to distal w/ emphasis on medial knee bottleneck and politeal LN. Performed fibrosis technique to B maleoli and distal  legs to address fatty fibrosis. Good tolerance.   Compression Bandaging RLE wrapped today as established for LLE.                OT Education - 06/25/16 1209    Education provided Yes  2/2 time constraints   Education Details Continued skilled Pt/caregiver Education  And LE ADL training throughout visit for lymphedema self care, including compression wrapping, compression garment and device wear/care, lymphatic pumping ther ex, simple self-MLD, and skin care. Discussed progress towards goals.   Person(s) Educated Patient  Methods Explanation;Handout;Demonstration   Comprehension Need further instruction             OT Long Term Goals - 04/23/16 1617    OT LONG TERM GOAL #1   Title Lymphedema (LE) self-care: Pt able to apply multi layered, gradient compression wraps independently using proper techniques within 2 weeks to achieve optimal limb volume reduction.   Baseline dependent   Time 2   Period Weeks   Status New   OT LONG TERM GOAL #2   Title Lymphedema (LE) self-care:  Pt to achieve at least 10% BLE limb volume reductions during Intensive CDT to limit LE progression, decrease infection risk, to reduce pain/ discomfort, and to improve safe ambulation and functional mobility.    Baseline dependent   Time 12   Period Weeks   Status New   OT LONG TERM GOAL #3   Title Pt >/= 85 % compliant with all daily, LE self-care protocols for home program, including simple self-manual lymphatic drainage (MLD), skin care, lymphatic pumping the ex, skin care, and donning/ doffing compression wraps and garments using assistive devices PRN (modified independence)  to limit LE progression and further functional decline.     Baseline dependent   Time 12   Period Weeks   Status New   OT LONG TERM GOAL #4   Title Pt to tolerate daily compression wraps, garments and devices in keeping w/ prescribed wear regime within 1 week of issue date to progress and retain clinical and functional gains and to limit LE progression.   Baseline dependent   Time 12   Period Weeks   Status New   OT LONG TERM GOAL #5   Title During Management Phase CDT Pt to sustain limb volume reductions achieved during Intensive Phase CDT within 5% utilizing LE self-care protocols, appropriate compression garments/ devices, and needed level of caregiver assistance.   Baseline dependent   Time 6   Period Months   Status New               Plan - 06/25/16 1210    Clinical Impression Statement Connditrion of tissuew swelling and fibrosis continues to revert to premorbid levels. 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 Garments ordered and will fit ASAP. No HOS ordered due to poor complliance thus far w/ compression.   Rehab Potential Good   OT Frequency 3x / week   OT Duration 12 weeks   OT Treatment/Interventions DME and/or AE instruction;Manual lymph drainage;Patient/family education;Compression bandaging;Therapeutic exercises;Therapeutic activities;Other (comment);Manual Therapy;Self-care/ADL training  skin care   OT Home Exercise Plan such as [employer], to facilitate improved lymphatic  and vascular function , and to limit fibrosis formation during HOS   Consulted and Agree with Plan of Care Patient      Patient will benefit from skilled therapeutic intervention in order to improve the following deficits and impairments:  Decreased knowledge of use of DME, Decreased skin integrity, Increased edema, Impaired flexibility, Pain, Decreased mobility, Decreased range of motion, Difficulty walking, Abnormal gait  Visit Diagnosis: Lymphedema, not elsewhere classified    Problem List Patient Active Problem List   Diagnosis Date Noted  . Frequent headaches 03/06/2016  . Microscopic hematuria 09/16/2015  . Bilateral renal cysts 09/16/2015  . Urinary frequency 09/16/2015  . Nocturia 09/16/2015  . Bilateral edema of lower extremity 09/09/2015  . Essential hypertension 09/09/2015  .  Anemia 09/09/2015  . History of hematuria 09/09/2015   Andrey Spearman, MS, OTR/L, [facility] 06/25/2016 12:13 PM  [place] MAIN [facility] SERVICES [address] [place], [place], [address] Phone: [phone]   Fax:  [phone]  Name: Ann Barker MRN: [id number] Date of Birth: [birth year]

## 2016-06-25 NOTE — Patient Instructions (Signed)
LE instructions and precautions as established- see initial eval.   

## 2016-06-28 ENCOUNTER — Ambulatory Visit: Payer: Commercial Managed Care - HMO | Admitting: Occupational Therapy

## 2016-06-28 ENCOUNTER — Other Ambulatory Visit: Payer: Self-pay | Admitting: Primary Care

## 2016-06-28 DIAGNOSIS — I890 Lymphedema, not elsewhere classified: Secondary | ICD-10-CM | POA: Diagnosis not present

## 2016-06-28 NOTE — Patient Instructions (Signed)
LE instructions and precautions as established- see initial eval.   

## 2016-06-28 NOTE — Therapy (Signed)
[place] MAIN [facility] SERVICES [address] [place], [place], [address] Phone: [phone]   Fax:  [phone]  Occupational Therapy Treatment & Progress Note  Patient Details  Name: Ann Barker MRN: [id number] Date of Birth: 1951-03-31 No Data Recorded  Encounter Date: 06/28/2016      OT End of Session - 06/28/16 1635    Visit Number 20   Number of Visits 36   Date for OT Re-Evaluation 07/20/16   OT Start Time 0315   OT Stop Time 0415   OT Time Calculation (min) 60 min      Past Medical History  Diagnosis Date  . Hypertension   . Hematuria     microscopic  . Lower extremity edema   . Anemia   . Arthritis   . Acid reflux   . UTI (lower urinary tract infection)   . Hoarseness   . HLD (hyperlipidemia)   . Vitamin D deficiency   . Dizziness   . Urge incontinence   . Renal cyst   . Gallstones   . Over weight     Past Surgical History  Procedure Laterality Date  . Abdominal hysterectomy  1981  . Hernia repair  2005  . Breast biopsy  1995/2005  . Tubal ligation  1975    There were no vitals filed for this visit.      Subjective Assessment - 06/28/16 1525    Subjective  Pt presents for OT visit 20 to address BLE lymphedema.  Pt presents without LLE compression wraps. Pt agrees with plan to decrease OT frequency to 2 x weekly while awaiting garment fitting.   Pertinent History 2014 & 2015 B Laser vein ablation; Keller arthroplasty R 1st metatarsophalangeal joint 03/12/16. Reported hx of serious L groin infection and cellulitis s/p hysterectomy several years ago; BLE vein ablation several years ago by report;;HTN   Limitations difficulty walking, difficulty w/ extended standing and sitting, difficulty fitting LB clothing and street shoes   Patient Stated Goals reduce swelling and leg pain to be able to do more   Currently in Pain? No/denies   Pain Onset More than a month ago                      OT  Treatments/Exercises (OP) - 06/28/16 0001    ADLs   ADL Education Given Yes   Manual Therapy   Manual Therapy Edema management;Compression Bandaging   Edema Management Skin care w/  skin grade, low pH castor oil   Manual Lymphatic Drainage (MLD) Manual lymph drainage (MLD) to LLE in supine utilizing functional inguinal lymph nodes and deep abdominal lymphatics as is customary for non-cancer related lower extremity LE, including bilateral "short neck" sequence, J strokes to sub and supraclavicular LN, deep abdominal pathways, functional inguinal LN, lower extremity proximal to distal w/ emphasis on medial knee bottleneck and politeal LN. Performed fibrosis technique to B maleoli and distal  legs to address fatty fibrosis. Good tolerance.   Compression Bandaging Pt wearing existing compression knee high on RLE. LLE compression wraps applied as established.                OT Education - 06/28/16 1634    Education Details Continued skilled Pt/caregiver Education  And LE ADL training throughout visit for lymphedema self care, including compression wrapping, compression garment and device wear/care, lymphatic pumping ther ex, simple self-MLD, and skin care. Discussed progress towards goals.   Person(s) Educated Patient  Methods Explanation   Comprehension Verbalized understanding;Need further instruction             OT Long Term Goals - 06/28/16 1636    OT LONG TERM GOAL #1   Title Lymphedema (LE) self-care: Pt able to apply multi layered, gradient compression wraps independently using proper techniques within 2 weeks to achieve optimal limb volume reduction.   Baseline dependent   Time 2   Period Weeks   Status Partially Met   OT LONG TERM GOAL #2   Title Lymphedema (LE) self-care:  Pt to achieve at least 10% BLE limb volume reductions during Intensive CDT to limit LE progression, decrease infection risk, to reduce pain/ discomfort, and to improve safe ambulation and functional  mobility.   Baseline dependent   Time 12   Period Weeks   Status Partially Met   OT LONG TERM GOAL #3   Title Pt >/= 85 % compliant with all daily, LE self-care protocols for home program, including simple self-manual lymphatic drainage (MLD), skin care, lymphatic pumping the ex, skin care, and donning/ doffing compression wraps and garments using assistive devices PRN (modified independence)  to limit LE progression and further functional decline.     Baseline dependent   Time 12   Period Weeks   Status Partially Met   OT LONG TERM GOAL #4   Title Pt to tolerate daily compression wraps, garments and devices in keeping w/ prescribed wear regime within 1 week of issue date to progress and retain clinical and functional gains and to limit LE progression.   Baseline dependent   Time 12   Period Weeks   Status Partially Met   OT LONG TERM GOAL #5   Title During Management Phase CDT Pt to sustain limb volume reductions achieved during Intensive Phase CDT within 5% utilizing LE self-care protocols, appropriate compression garments/ devices, and needed level of caregiver assistance.   Baseline dependent   Time 6   Period Months   Status On-going               Plan - 06/28/16 1637    Clinical Impression Statement Pt demonstrates slow but steady progress towards goals. Todate she has met 10% limb volunme reduction for below knee landmark w/ 10.42%. Ankle to groin reduction measures 5.1% overall, which is encouraging since we have not applied compression above the knee. during CDT. RLE is decreased overall by 8.38 % below the knee and by 7.42% from ankle to groin withoiut dirct care to this extremity. Pt is moderately compliant w/ home program between visits. 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 Pt has learned MLD and ther ex. Garments have been ordered. Pt agrees w/ plan to decrease OT frequency to 2 x  weekly while awaiting fit fitting.   Rehab Potential Good   OT Frequency 3x / week   OT Duration 12 weeks   OT Treatment/Interventions DME and/or AE instruction;Manual lymph drainage;Patient/family education;Compression bandaging;Therapeutic exercises;Therapeutic activities;Other (comment);Manual Therapy;Self-care/ADL training  skin care   OT Home Exercise Plan such as [employer], to facilitate improved lymphatic and vascular function , and to limit fibrosis formation during HOS   Consulted and Agree with Plan of Care Patient      Patient will benefit from skilled therapeutic intervention in order to improve the following deficits and impairments:  Decreased knowledge of use of DME, Decreased skin integrity, Increased edema, Impaired flexibility, Pain, Decreased mobility, Decreased range  of motion, Difficulty walking, Abnormal gait  Visit Diagnosis: Lymphedema, not elsewhere classified      G-Codes - Jul 20, 2016 1644    Functional Assessment Tool Used Clinical observation and examination, medical records review, Pt and caregiver interview, comparative limb volumetrics   Functional Limitation Self care   Self Care Current Status (F9692) At least 20 percent but less than 40 percent impaired, limited or restricted   Self Care Goal Status (S9324) At least 1 percent but less than 20 percent impaired, limited or restricted      Problem List Patient Active Problem List   Diagnosis Date Noted  . Frequent headaches 03/06/2016  . Microscopic hematuria 09/16/2015  . Bilateral renal cysts 09/16/2015  . Urinary frequency 09/16/2015  . Nocturia 09/16/2015  . Bilateral edema of lower extremity 09/09/2015  . Essential hypertension 09/09/2015  . Anemia 09/09/2015  . History of hematuria 09/09/2015   Andrey Spearman, MS, OTR/L, [facility] 07-20-16 4:45 PM  [place] MAIN [facility] SERVICES [address] [place], [place], [address] Phone: [phone]   Fax:   [phone]  Name: Ann Barker MRN: [id number] Date of Birth: 02-18-1951

## 2016-06-30 ENCOUNTER — Ambulatory Visit: Payer: Commercial Managed Care - HMO | Admitting: Occupational Therapy

## 2016-07-01 ENCOUNTER — Ambulatory Visit: Payer: Commercial Managed Care - HMO | Admitting: Occupational Therapy

## 2016-07-01 DIAGNOSIS — I890 Lymphedema, not elsewhere classified: Secondary | ICD-10-CM | POA: Diagnosis not present

## 2016-07-01 NOTE — Therapy (Signed)
[place] MAIN [facility] SERVICES [address] [place], [place], [address] Phone: [phone]   Fax:  [phone]  Occupational Therapy Treatment  Patient Details  Name: Ann Barker MRN: [id number] Date of Birth: 22-Apr-1951 No Data Recorded  Encounter Date: 07/01/2016      OT End of Session - 07/01/16 1214    Visit Number 21   Number of Visits 36   Date for OT Re-Evaluation 07/20/16   OT Start Time 1112   OT Stop Time 1209   OT Time Calculation (min) 57 min      Past Medical History  Diagnosis Date  . Hypertension   . Hematuria     microscopic  . Lower extremity edema   . Anemia   . Arthritis   . Acid reflux   . UTI (lower urinary tract infection)   . Hoarseness   . HLD (hyperlipidemia)   . Vitamin D deficiency   . Dizziness   . Urge incontinence   . Renal cyst   . Gallstones   . Over weight     Past Surgical History  Procedure Laterality Date  . Abdominal hysterectomy  1981  . Hernia repair  2005  . Breast biopsy  1995/2005  . Tubal ligation  1975    There were no vitals filed for this visit.      Subjective Assessment - 07/01/16 1212    Subjective  Pt presents for OT visit 21 to address BLE lymphedema.  Pt presents without LLE compression wraps in place. Pt is accompanied by her granddaughter.    Pertinent History 2014 & 2015 B Laser vein ablation; Keller arthroplasty R 1st metatarsophalangeal joint 03/12/16. Reported hx of serious L groin infection and cellulitis s/p hysterectomy several years ago; BLE vein ablation several years ago by report;;HTN   Limitations difficulty walking, difficulty w/ extended standing and sitting, difficulty fitting LB clothing and street shoes   Patient Stated Goals reduce swelling and leg pain to be able to do more   Pain Onset More than a month ago                      OT Treatments/Exercises (OP) - 07/01/16 0001    Manual Therapy   Manual Therapy Edema  management;Compression Bandaging   Edema Management Skin care w/  skin grade, low pH castor oil   Manual Lymphatic Drainage (MLD) Manual lymph drainage (MLD) to LLE in supine utilizing functional inguinal lymph nodes and deep abdominal lymphatics as is customary for non-cancer related lower extremity LE, including bilateral "short neck" sequence, J strokes to sub and supraclavicular LN, deep abdominal pathways, functional inguinal LN, lower extremity proximal to distal w/ emphasis on medial knee bottleneck and politeal LN. Performed fibrosis technique to B maleoli and distal  legs to address fatty fibrosis. Good tolerance.   Compression Bandaging Pt wearing existing compression knee high on RLE. LLE compression wraps applied as established.                OT Education - 07/01/16 1213    Education provided No  2/2 time constraints   Education Details No Pt edu today 2/2 Pt fatigued from overnight at [facility] ED w/ her daughter.              OT Long Term Goals - 06/28/16 1636    OT LONG TERM GOAL #1   Title Lymphedema (LE) self-care: Pt able to apply multi layered, gradient compression  wraps independently using proper techniques within 2 weeks to achieve optimal limb volume reduction.   Baseline dependent   Time 2   Period Weeks   Status Partially Met   OT LONG TERM GOAL #2   Title Lymphedema (LE) self-care:  Pt to achieve at least 10% BLE limb volume reductions during Intensive CDT to limit LE progression, decrease infection risk, to reduce pain/ discomfort, and to improve safe ambulation and functional mobility.   Baseline dependent   Time 12   Period Weeks   Status Partially Met   OT LONG TERM GOAL #3   Title Pt >/= 85 % compliant with all daily, LE self-care protocols for home program, including simple self-manual lymphatic drainage (MLD), skin care, lymphatic pumping the ex, skin care, and donning/ doffing compression wraps and garments using assistive devices PRN  (modified independence)  to limit LE progression and further functional decline.     Baseline dependent   Time 12   Period Weeks   Status Partially Met   OT LONG TERM GOAL #4   Title Pt to tolerate daily compression wraps, garments and devices in keeping w/ prescribed wear regime within 1 week of issue date to progress and retain clinical and functional gains and to limit LE progression.   Baseline dependent   Time 12   Period Weeks   Status Partially Met   OT LONG TERM GOAL #5   Title During Management Phase CDT Pt to sustain limb volume reductions achieved during Intensive Phase CDT within 5% utilizing LE self-care protocols, appropriate compression garments/ devices, and needed level of caregiver assistance.   Baseline dependent   Time 6   Period Months   Status On-going               Plan - 07/01/16 1214    Clinical Impression Statement Pt had no time for LE self care over the past few days 2/2 cargiving demands. Pt continues to await delivery and fitting of custom garments. OT will continue to support her for retention of clinical gains until garments are fitted.   Rehab Potential Good   OT Frequency 3x / week   OT Duration 12 weeks   OT Treatment/Interventions DME and/or AE instruction;Manual lymph drainage;Patient/family education;Compression bandaging;Therapeutic exercises;Therapeutic activities;Other (comment);Manual Therapy;Self-care/ADL training  skin care   OT Home Exercise Plan such as [employer], to facilitate improved lymphatic and vascular function , and to limit fibrosis formation during HOS   Consulted and Agree with Plan of Care Patient      Patient will benefit from skilled therapeutic intervention in order to improve the following deficits and impairments:  Decreased knowledge of use of DME, Decreased skin integrity, Increased edema, Impaired flexibility, Pain, Decreased mobility, Decreased range of motion, Difficulty walking, Abnormal gait  Visit  Diagnosis: Lymphedema, not elsewhere classified    Problem List Patient Active Problem List   Diagnosis Date Noted  . Frequent headaches 03/06/2016  . Microscopic hematuria 09/16/2015  . Bilateral renal cysts 09/16/2015  . Urinary frequency 09/16/2015  . Nocturia 09/16/2015  . Bilateral edema of lower extremity 09/09/2015  . Essential hypertension 09/09/2015  . Anemia 09/09/2015  . History of hematuria 09/09/2015   Andrey Spearman, MS, OTR/L, [facility] 07/01/2016 12:17 PM  [place] MAIN [facility] SERVICES [address] [place], [place], [address] Phone: [phone]   Fax:  [phone]  Name: Ann Barker MRN: [id number] Date of Birth: October 17, 1951

## 2016-07-02 ENCOUNTER — Ambulatory Visit: Payer: Commercial Managed Care - HMO | Admitting: Occupational Therapy

## 2016-07-05 ENCOUNTER — Ambulatory Visit: Payer: Commercial Managed Care - HMO | Admitting: Occupational Therapy

## 2016-07-07 ENCOUNTER — Ambulatory Visit: Payer: Commercial Managed Care - HMO | Admitting: Occupational Therapy

## 2016-07-09 ENCOUNTER — Ambulatory Visit: Payer: Commercial Managed Care - HMO | Admitting: Occupational Therapy

## 2016-07-09 DIAGNOSIS — I890 Lymphedema, not elsewhere classified: Secondary | ICD-10-CM | POA: Diagnosis not present

## 2016-07-09 NOTE — Therapy (Signed)
[place] MAIN [facility] SERVICES [address] [place], [place], [address] Phone: [phone]   Fax:  [phone]  Occupational Therapy Treatment  Patient Details  Name: Ann Barker MRN: [id number] Date of Birth: 1951-02-26 No Data Recorded  Encounter Date: 07/09/2016      OT End of Session - 07/09/16 1148    Visit Number 22   Number of Visits 36   Date for OT Re-Evaluation 07/20/16   OT Start Time 0925   OT Stop Time 1018   OT Time Calculation (min) 53 min   Activity Tolerance Patient tolerated treatment well;No increased pain   Behavior During Therapy WFL for tasks assessed/performed      Past Medical History:  Diagnosis Date  . Acid reflux   . Anemia   . Arthritis   . Dizziness   . Gallstones   . Hematuria    microscopic  . HLD (hyperlipidemia)   . Hoarseness   . Hypertension   . Lower extremity edema   . Over weight   . Renal cyst   . Urge incontinence   . UTI (lower urinary tract infection)   . Vitamin D deficiency     Past Surgical History:  Procedure Laterality Date  . ABDOMINAL HYSTERECTOMY  1981  . BREAST BIOPSY  1995/2005  . HERNIA REPAIR  2005  . TUBAL LIGATION  1975    There were no vitals filed for this visit.      Subjective Assessment - 07/09/16 1038    Subjective  Pt presents for OT visit 22 to address BLE lymphedema.  Emphasis of visit on ftting BLE custom ccl 3 Elvarex knee highs.   Pertinent History 2014 & 2015 B Laser vein ablation; Keller arthroplasty R 1st metatarsophalangeal joint 03/12/16. Reported hx of serious L groin infection and cellulitis s/p hysterectomy several years ago; BLE vein ablation several years ago by report;;HTN   Limitations difficulty walking, difficulty w/ extended standing and sitting, difficulty fitting LB clothing and street shoes   Patient Stated Goals reduce swelling and leg pain to be able to do more   Pain Onset More than a month ago                               OT Education - 07/09/16 1155    Education provided No  2/2 time constraints   Education Details Emphasis of skilled LE self-care training today on compression garment/ device rational, proper fit and function for lymphedema management over time, wear and care regimes, and assistive device options for donning and doffing w/ less physical effort.   Person(s) Educated Patient   Methods Explanation;Demonstration;Tactile cues;Verbal cues;Handout   Comprehension Verbalized understanding;Returned demonstration;Verbal cues required;Tactile cues required             OT Long Term Goals - 06/28/16 1636      OT LONG TERM GOAL #1   Title Lymphedema (LE) self-care: Pt able to apply multi layered, gradient compression wraps independently using proper techniques within 2 weeks to achieve optimal limb volume reduction.   Baseline dependent   Time 2   Period Weeks   Status Partially Met     OT LONG TERM GOAL #2   Title Lymphedema (LE) self-care:  Pt to achieve at least 10% BLE limb volume reductions during Intensive CDT to limit LE progression, decrease infection risk, to reduce pain/ discomfort, and to improve safe ambulation and functional mobility.  Baseline dependent   Time 12   Period Weeks   Status Partially Met     OT LONG TERM GOAL #3   Title Pt >/= 85 % compliant with all daily, LE self-care protocols for home program, including simple self-manual lymphatic drainage (MLD), skin care, lymphatic pumping the ex, skin care, and donning/ doffing compression wraps and garments using assistive devices PRN (modified independence)  to limit LE progression and further functional decline.     Baseline dependent   Time 12   Period Weeks   Status Partially Met     OT LONG TERM GOAL #4   Title Pt to tolerate daily compression wraps, garments and devices in keeping w/ prescribed wear regime within 1 week of issue date to progress and retain clinical  and functional gains and to limit LE progression.   Baseline dependent   Time 12   Period Weeks   Status Partially Met     OT LONG TERM GOAL #5   Title During Management Phase CDT Pt to sustain limb volume reductions achieved during Intensive Phase CDT within 5% utilizing LE self-care protocols, appropriate compression garments/ devices, and needed level of caregiver assistance.   Baseline dependent   Time 6   Period Months   Status On-going               Plan - 07/09/16 1149    Clinical Impression Statement Custom , ccl 3, flat knit, Elvarex knee length, compression stockings appear to fit well  and function appropriately at initial assessment. With skilled Pt edu Pt able to use assistive devices ( foot slippie, friction gloves, and OFF donner) to don and doff compression garments with modified independence and extra time. Pt wil wear garments daily during waking hours, She will don upon rising and remove for sleep. When seated she will continue to elevate her legs. Pt will wash garments according to manufacturer's directions and we'll see her back next week to finalize garment assessment. Pt understands we may want to order remakes  if  garments are a bit too long, which is only possible fit issue I note today.      Patient will benefit from skilled therapeutic intervention in order to improve the following deficits and impairments:     Visit Diagnosis: Lymphedema, not elsewhere classified    Problem List Patient Active Problem List   Diagnosis Date Noted  . Frequent headaches 03/06/2016  . Microscopic hematuria 09/16/2015  . Bilateral renal cysts 09/16/2015  . Urinary frequency 09/16/2015  . Nocturia 09/16/2015  . Bilateral edema of lower extremity 09/09/2015  . Essential hypertension 09/09/2015  . Anemia 09/09/2015  . History of hematuria 09/09/2015    Andrey Spearman, MS, OTR/L, [facility] 07/09/16 11:57 AM   [place] MAIN  [facility] SERVICES [address] [place], [place], [address] Phone: [phone]   Fax:  [phone]  Name: Ann Barker MRN: [id number] Date of Birth: 07-10-1951

## 2016-07-12 ENCOUNTER — Ambulatory Visit: Payer: Commercial Managed Care - HMO | Admitting: Occupational Therapy

## 2016-07-13 DIAGNOSIS — I890 Lymphedema, not elsewhere classified: Secondary | ICD-10-CM | POA: Diagnosis not present

## 2016-07-13 DIAGNOSIS — E669 Obesity, unspecified: Secondary | ICD-10-CM | POA: Diagnosis not present

## 2016-07-13 DIAGNOSIS — M79609 Pain in unspecified limb: Secondary | ICD-10-CM | POA: Diagnosis not present

## 2016-07-13 DIAGNOSIS — M7989 Other specified soft tissue disorders: Secondary | ICD-10-CM | POA: Diagnosis not present

## 2016-07-14 ENCOUNTER — Ambulatory Visit: Payer: Commercial Managed Care - HMO | Attending: Primary Care | Admitting: Occupational Therapy

## 2016-07-14 DIAGNOSIS — I890 Lymphedema, not elsewhere classified: Secondary | ICD-10-CM | POA: Insufficient documentation

## 2016-07-14 NOTE — Patient Instructions (Signed)

## 2016-07-14 NOTE — Therapy (Signed)
[place] [facility] SERVICES [address] [place], [place], [address] Phone: [phone]   Fax:  [phone]  Occupational Therapy Treatment  Patient Details  Name: Ann Barker MRN: [id number] Date of Birth: 08/22/1951 No Data Recorded  Encounter Date: 07/14/2016      OT End of Session - 07/14/16 1016    Visit Number 23   Number of Visits 36   Date for OT Re-Evaluation 07/20/16   OT Start Time 0924   OT Stop Time 1012   OT Time Calculation (min) 48 min   Activity Tolerance Patient tolerated treatment well;No increased pain   Behavior During Therapy WFL for tasks assessed/performed      Past Medical History:  Diagnosis Date  . Acid reflux   . Anemia   . Arthritis   . Dizziness   . Gallstones   . Hematuria    microscopic  . HLD (hyperlipidemia)   . Hoarseness   . Hypertension   . Lower extremity edema   . Over weight   . Renal cyst   . Urge incontinence   . UTI (lower urinary tract infection)   . Vitamin D deficiency     Past Surgical History:  Procedure Laterality Date  . ABDOMINAL HYSTERECTOMY  1981  . BREAST BIOPSY  1995/2005  . HERNIA REPAIR  2005  . TUBAL LIGATION  1975    There were no vitals filed for this visit.      Subjective Assessment - 07/14/16 1013    Subjective  Pt presents for OT visit 23 to address BLE lymphedema.  Emphasis of visit on fitting follow up for custom knee length compression garments, and Pt edu for LE self care as she transitions to Management Phase CDT.    Pertinent History 2014 & 2015 B Laser vein ablation; Keller arthroplasty R 1st metatarsophalangeal joint 03/12/16. Reported hx of serious L groin infection and cellulitis s/p hysterectomy several years ago; BLE vein ablation several years ago by report;;HTN   Limitations difficulty walking, difficulty w/ extended standing and sitting, difficulty fitting LB clothing and street shoes   Patient Stated Goals reduce swelling and leg  pain to be able to do more   Pain Onset More than a month ago                              OT Education - 07/14/16 1014    Education provided Yes  2/2 time constraints   Education Details Reviewed LE self care regime, including simple self MLD, skin care, compression garment wear and care, ther ex. Provided printed copy of LE precautions. Pt understands compression garments should be worn daily during waking hours only, and should be replaces q 3-6 months and PRN.    Person(s) Educated Patient   Methods Explanation;Demonstration;Handout   Comprehension Verbalized understanding             OT Long Term Goals - 07/14/16 1021      OT LONG TERM GOAL #1   Title Lymphedema (LE) self-care: Pt able to apply multi layered, gradient compression wraps independently using proper techniques within 2 weeks to achieve optimal limb volume reduction.   Baseline dependent   Time 2   Period Weeks   Status Achieved     OT LONG TERM GOAL #2   Title Lymphedema (LE) self-care:  Pt to achieve at least 10% BLE limb volume reductions during Intensive CDT to limit  LE progression, decrease infection risk, to reduce pain/ discomfort, and to improve safe ambulation and functional mobility.   Baseline dependent   Time 12   Period Weeks   Status Partially Met     OT LONG TERM GOAL #3   Title Pt >/= 85 % compliant with all daily, LE self-care protocols for home program, including simple self-manual lymphatic drainage (MLD), skin care, lymphatic pumping the ex, skin care, and donning/ doffing compression wraps and garments using assistive devices PRN (modified independence)  to limit LE progression and further functional decline.     Baseline dependent   Time 12   Period Weeks   Status Achieved     OT LONG TERM GOAL #4   Title Pt to tolerate daily compression wraps, garments and devices in keeping w/ prescribed wear regime within 1 week of issue date to progress and retain clinical  and functional gains and to limit LE progression.   Baseline dependent   Time 12   Period Weeks   Status Achieved     OT LONG TERM GOAL #5   Title During Management Phase CDT Pt to sustain limb volume reductions achieved during Intensive Phase CDT within 5% utilizing LE self-care protocols, appropriate compression garments/ devices, and needed level of caregiver assistance.   Baseline dependent   Time 6   Period Months   Status On-going               Plan - 07/14/16 1017    Clinical Impression Statement Custom class 3 elvarex elastc compression knee highs appear to fot and function appropriately. Pt able to don and doff with modified independence using assistive devices (gripper gloves and slipped sock).  Pt has met all but initial long term volumetric reduction OT goals for lymphedema care,  and is ready to transition to management phase of CDT where she will perform simple self MLD, skin care, ther ex, and utilize compression daily as directed. Pt will return for f/u volumetrics and assessment in 1 month and will calll prn.   Rehab Potential Good   OT Frequency Monthly   OT Duration 4 weeks   OT Treatment/Interventions DME and/or AE instruction;Manual lymph drainage;Patient/family education;Compression bandaging;Therapeutic exercises;Therapeutic activities;Other (comment);Manual Therapy;Self-care/ADL training      Patient will benefit from skilled therapeutic intervention in order to improve the following deficits and impairments:  Decreased knowledge of use of DME, Decreased skin integrity, Increased edema, Impaired flexibility, Pain, Decreased mobility, Decreased range of motion, Difficulty walking, Abnormal gait  Visit Diagnosis: Lymphedema, not elsewhere classified    Problem List Patient Active Problem List   Diagnosis Date Noted  . Frequent headaches 03/06/2016  . Microscopic hematuria 09/16/2015  . Bilateral renal cysts 09/16/2015  . Urinary frequency 09/16/2015   . Nocturia 09/16/2015  . Bilateral edema of lower extremity 09/09/2015  . Essential hypertension 09/09/2015  . Anemia 09/09/2015  . History of hematuria 09/09/2015    Andrey Spearman, MS, OTR/L, [facility] 07/14/16 10:24 AM   [place] [facility] SERVICES [address] [place], [place], [address] Phone: [phone]   Fax:  [phone]  Name: SHADY PADRON MRN: [id number] Date of Birth: 1951-09-17

## 2016-07-16 ENCOUNTER — Ambulatory Visit: Payer: Commercial Managed Care - HMO | Admitting: Occupational Therapy

## 2016-07-23 ENCOUNTER — Ambulatory Visit (INDEPENDENT_AMBULATORY_CARE_PROVIDER_SITE_OTHER): Payer: Commercial Managed Care - HMO | Admitting: Family Medicine

## 2016-07-23 ENCOUNTER — Encounter: Payer: Self-pay | Admitting: Family Medicine

## 2016-07-23 DIAGNOSIS — M791 Myalgia, unspecified site: Secondary | ICD-10-CM

## 2016-07-23 MED ORDER — CYCLOBENZAPRINE HCL 10 MG PO TABS: 5.0000 mg | ORAL_TABLET | Freq: Every evening | ORAL | 0 refills | Status: DC | PRN

## 2016-07-23 MED ORDER — IBUPROFEN 200 MG PO TABS: 400.0000 mg | ORAL_TABLET | Freq: Three times a day (TID) | ORAL | Status: DC | PRN

## 2016-07-23 NOTE — Patient Instructions (Signed)
Use a heating pad on your neck and upper back.  Try ice on your left upper arm.  Take 200mg  ibuprofen (over the counter), 2 tabs 3 times a day if needed for pain with food.  Take flexeril at night if needed.  It can make you drowsy.  Take care.  Glad to see you.  Update [place] as needed.
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
## 2016-07-25 DIAGNOSIS — M791 Myalgia, unspecified site: Secondary | ICD-10-CM | POA: Insufficient documentation

## 2016-07-25 NOTE — Assessment & Plan Note (Signed)
Likely benign soft tissue issue, no need for imaging at this point.  D/w pt.  Can use a heating pad on neck and upper back.  Try ice on left upper arm.  Take 200mg  ibuprofen (over the counter), 2 tabs 3 times a day if needed for pain with food.  Take flexeril at night if needed.  Sedation caution.  Update [place] as needed. She agrees.

## 2016-08-01 ENCOUNTER — Encounter: Payer: Self-pay | Admitting: Emergency Medicine

## 2016-08-01 ENCOUNTER — Emergency Department: Payer: Commercial Managed Care - HMO

## 2016-08-01 ENCOUNTER — Emergency Department
Admission: EM | Admit: 2016-08-01 | Discharge: 2016-08-01 | Disposition: A | Discharge status: Home / Self Care | Payer: Commercial Managed Care - HMO | Attending: Emergency Medicine | Admitting: Emergency Medicine

## 2016-08-01 DIAGNOSIS — W109XXA Fall (on) (from) unspecified stairs and steps, initial encounter: Secondary | ICD-10-CM | POA: Diagnosis not present

## 2016-08-01 DIAGNOSIS — Z23 Encounter for immunization: Secondary | ICD-10-CM | POA: Diagnosis not present

## 2016-08-01 DIAGNOSIS — S99921A Unspecified injury of right foot, initial encounter: Secondary | ICD-10-CM | POA: Diagnosis present

## 2016-08-01 DIAGNOSIS — I10 Essential (primary) hypertension: Secondary | ICD-10-CM | POA: Insufficient documentation

## 2016-08-01 DIAGNOSIS — S92511A Displaced fracture of proximal phalanx of right lesser toe(s), initial encounter for closed fracture: Secondary | ICD-10-CM | POA: Insufficient documentation

## 2016-08-01 DIAGNOSIS — Z79899 Other long term (current) drug therapy: Secondary | ICD-10-CM | POA: Insufficient documentation

## 2016-08-01 DIAGNOSIS — S92911A Unspecified fracture of right toe(s), initial encounter for closed fracture: Secondary | ICD-10-CM

## 2016-08-01 DIAGNOSIS — Y939 Activity, unspecified: Secondary | ICD-10-CM | POA: Diagnosis not present

## 2016-08-01 DIAGNOSIS — Y929 Unspecified place or not applicable: Secondary | ICD-10-CM | POA: Insufficient documentation

## 2016-08-01 DIAGNOSIS — S91111A Laceration without foreign body of right great toe without damage to nail, initial encounter: Secondary | ICD-10-CM | POA: Insufficient documentation

## 2016-08-01 DIAGNOSIS — Y999 Unspecified external cause status: Secondary | ICD-10-CM | POA: Diagnosis not present

## 2016-08-01 DIAGNOSIS — S91119A Laceration without foreign body of unspecified toe without damage to nail, initial encounter: Secondary | ICD-10-CM

## 2016-08-01 MED ORDER — OXYCODONE-ACETAMINOPHEN 5-325 MG PO TABS
2.0000 | ORAL_TABLET | Freq: Once | ORAL | Status: AC
  Administered 2016-08-01: 2 via ORAL
  Filled 2016-08-01: qty 2

## 2016-08-01 MED ORDER — LIDOCAINE-EPINEPHRINE (PF) 1 %-1:200000 IJ SOLN
30.0000 mL | Freq: Once | INTRAMUSCULAR | Status: AC
  Administered 2016-08-01: 30 mL

## 2016-08-01 MED ORDER — CEPHALEXIN 500 MG PO CAPS: 500.0000 mg | ORAL_CAPSULE | Freq: Four times a day (QID) | ORAL | 0 refills | Status: AC

## 2016-08-01 MED ORDER — MUPIROCIN 2 % EX OINT: TOPICAL_OINTMENT | CUTANEOUS | 0 refills | Status: DC

## 2016-08-01 MED ORDER — OXYCODONE-ACETAMINOPHEN 5-325 MG PO TABS: 2.0000 | ORAL_TABLET | Freq: Four times a day (QID) | ORAL | 0 refills | Status: DC | PRN

## 2016-08-01 MED ORDER — TETANUS-DIPHTH-ACELL PERTUSSIS 5-2.5-18.5 LF-MCG/0.5 IM SUSP
0.5000 mL | Freq: Once | INTRAMUSCULAR | Status: AC
  Administered 2016-08-01: 0.5 mL via INTRAMUSCULAR
  Filled 2016-08-01: qty 0.5

## 2016-08-01 MED ORDER — LIDOCAINE-EPINEPHRINE (PF) 1 %-1:200000 IJ SOLN
INTRAMUSCULAR | Status: AC
  Administered 2016-08-01: 30 mL
  Filled 2016-08-01: qty 30

## 2016-08-01 NOTE — ED Notes (Signed)
Pt resting quietly in the lobby and waiting patiently for treatment room;

## 2016-08-01 NOTE — ED Triage Notes (Signed)
Patient to ED for a laceration to the bottom of her great toe. States she tripped up the steps but did not know she was cut until she saw blood in her shoe. Examination of the toe reveals a laceration at the bend of her toe. BLeeding controlled with bandage. Denies other injury.

## 2016-08-01 NOTE — ED Notes (Signed)
Pt's wound cleaned with sterile saline, dried blood obscured wound, wound now visualized clearly, 2 inch laceration across underside of right great toe

## 2016-08-01 NOTE — ED Provider Notes (Signed)
[facility] Emergency Department Provider Note        Time seen: ----------------------------------------- 7:10 AM on 08/01/2016 -----------------------------------------    I have reviewed the triage vital signs and the nursing notes.   HISTORY  Chief Complaint Laceration (Due to a fall)    HPI Ann Barker Erlene Quan is a 65 y.o. female who presents to ER after sustaining a laceration to the bottom of her right great toe. Patient states she tripped up the steps but did not know that she cut her foot on until she saw blood on her shoe. She sustained a laceration over the plantar surface, bleeding was controlled. She is uncertain of her previous tetanus status, the entire right foot hurts distally. Nothing makes it better.   Past Medical History:  Diagnosis Date  . Acid reflux   . Anemia   . Arthritis   . Dizziness   . Gallstones   . Hematuria    microscopic  . HLD (hyperlipidemia)   . Hoarseness   . Hypertension   . Lower extremity edema   . Over weight   . Renal cyst   . Urge incontinence   . UTI (lower urinary tract infection)   . Vitamin D deficiency     Patient Active Problem List   Diagnosis Date Noted  . Muscle pain 07/25/2016  . Frequent headaches 03/06/2016  . Microscopic hematuria 09/16/2015  . Bilateral renal cysts 09/16/2015  . Urinary frequency 09/16/2015  . Nocturia 09/16/2015  . Bilateral edema of lower extremity 09/09/2015  . Essential hypertension 09/09/2015  . Anemia 09/09/2015  . History of hematuria 09/09/2015    Past Surgical History:  Procedure Laterality Date  . ABDOMINAL HYSTERECTOMY  1981  . BREAST BIOPSY  1995/2005  . HERNIA REPAIR  2005  . TUBAL LIGATION  1975    Allergies Morphine; Clindamycin/lincomycin; Methylprednisolone; Codeine; Morphine and related; and Penicillins  Social History Social History  Substance Use Topics  . Smoking status: Never Smoker  . Smokeless tobacco: Never Used  . Alcohol  use No    Review of Systems Constitutional: Negative for fever. Musculoskeletal: Positive for right foot pain Skin: Positive for laceration Neurological: Negative for weakness or numbness  ____________________________________________   PHYSICAL EXAM:  VITAL SIGNS: ED Triage Vitals [08/01/16 0212]  Enc Vitals Group     BP (!) 159/92     Pulse Rate 91     Resp 20     Temp 98.1 F (36.7 C)     Temp Source Oral     SpO2 94 %     Weight 240 lb (108.9 kg)     Height 5\' 4"  (1.626 m)     Head Circumference      Peak Flow      Pain Score 5     Pain Loc      Pain Edu?      Excl. in [place]?     Constitutional: Alert and oriented. Well appearing and in no distress. Eyes: Conjunctivae are normal. PERRL. Normal extraocular movements. Musculoskeletal: Patient with distal right foot tenderness, particularly over the plantar surface, all of her toes are tender to touch, she has a laceration noted over the plantar surface of the right great toe. His appears to be proximal to the interphalangeal joint. She has difficulty flexing her right great toe concerning for flexor tendon injury. Neurologic:  Normal speech and language. No gross focal neurologic deficits are appreciated.  Skin: Right great toe laceration as dictated above. Psychiatric:  Mood and affect are normal. Speech and behavior are normal.  ___________________________________________  ED COURSE:  Pertinent labs & imaging results that were available during my care of the patient were reviewed by me and considered in my medical decision making (see chart for details). Clinical Course  Patient presents to ER for right great toe laceration and fall. We will obtain x-rays, she will require laceration repair.  Marland Kitchen.Laceration Repair Date/Time: 08/01/2016 7:47 AM Performed by: Earleen Newport Authorized by: Lenise Arena E   Consent:    Consent obtained:  Verbal   Consent given by:  Patient   Risks discussed:  Tendon  damage Anesthesia (see MAR for exact dosages):    Anesthesia method:  Local infiltration   Local anesthetic:  Lidocaine 1% WITH epi Laceration details:    Location:  Toe   Toe location:  R big toe   Length (cm):  7   Depth (mm):  10 Pre-procedure details:    Preparation:  Patient was prepped and draped in usual sterile fashion Exploration:    Hemostasis achieved with:  Epinephrine   Wound exploration: entire depth of wound probed and visualized     Contaminated: no   Treatment:    Area cleansed with:  Betadine   Amount of cleaning:  Standard   Irrigation solution:  Sterile saline   Visualized foreign bodies/material removed: no   Skin repair:    Repair method:  Sutures   Suture size:  3-0   Suture technique:  Simple interrupted   Number of sutures:  7 Approximation:    Approximation:  Close   Vermilion border: well-aligned   Post-procedure details:    Dressing:  Antibiotic ointment and non-adherent dressing   Patient tolerance of procedure:  Tolerated with difficulty   ____________________________________________   RADIOLOGY Images were viewed by me  IMPRESSION: Second proximal phalanx base fracture.   ____________________________________________  FINAL ASSESSMENT AND PLAN  Phalanx fracture, right great toe laceration  Plan: Patient with imaging as dictated above. Patient sustained laceration of the plantar surface at the base of the right great toe. Wound was repaired, she also sustained a fracture at the base of the second toe on the right foot. She is placed in a postop shoe, she will use a walking boot that she had at home. She'll be referred for close outpatient follow-up with podiatry in 1-2 days. I did not visualize any tendon injury but I'm concerned about tendon damage so I prefer she follow up with podiatry.   Earleen Newport, MD   Note: This dictation was prepared with Dragon dictation. Any transcriptional errors that result from this process  are unintentional    Earleen Newport, MD 08/01/16 [id number]

## 2016-08-01 NOTE — ED Notes (Signed)
Original triage note was entered under Ellison Carwin, EDT in error and should have been entered under Kelby Fam, Therapist, sports.

## 2016-08-05 ENCOUNTER — Telehealth: Payer: Self-pay
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
## 2016-08-06 ENCOUNTER — Encounter: Payer: Self-pay | Admitting: Family Medicine

## 2016-08-06 DIAGNOSIS — S92919A Unspecified fracture of unspecified toe(s), initial encounter for closed fracture: Secondary | ICD-10-CM | POA: Insufficient documentation

## 2016-08-10 ENCOUNTER — Ambulatory Visit (INDEPENDENT_AMBULATORY_CARE_PROVIDER_SITE_OTHER): Payer: Commercial Managed Care - HMO | Admitting: Primary Care

## 2016-08-10 VITALS — BP 148/94 | HR 86 | Temp 98.0°F | Ht 64.0 in | Wt 246.8 lb

## 2016-08-10 DIAGNOSIS — Z4802 Encounter for removal of sutures: Secondary | ICD-10-CM

## 2016-08-10 NOTE — Progress Notes (Signed)
Subjective:    Patient ID: Ann Barker Roles, female    DOB: March 17, 1951, 65 y.o.   MRN: [id number]  HPI  [name] is a 65 year old female who presents today for suture removal. She presented to [facility] ED on 08/01/16 with a chief complaint of laceration. Her laceration was present to the plantar surface of her right great toe. She was walking and tripped over a brick on her front porch. She did not realize she had a laceration to her toe until later on that evening when she noticed a "squishy" sensation in her shoe.  She also sustained a fracture at the base of the second toe of her right foot and was placed in a post-op shoe. She underwent suture placement with 3-0 sutures, seven sutures placed. She was encouraged to follow up with podiatry. She denies fevers, chills, erythema, bleeding.  She presents today for removal of sutures.   Review of Systems  Constitutional: Negative for chills, fatigue and fever.  Cardiovascular:       Improved lower extremity edema  Musculoskeletal: Positive for arthralgias.  Skin: Negative for color change.       Past Medical History:  Diagnosis Date  . Acid reflux   . Anemia   . Arthritis   . Dizziness   . Gallstones   . Hematuria    microscopic  . HLD (hyperlipidemia)   . Hoarseness   . Hypertension   . Lower extremity edema   . Over weight   . Renal cyst   . Urge incontinence   . UTI (lower urinary tract infection)   . Vitamin D deficiency      Social History   Social History  . Marital status: Married    Spouse name: N/A  . Number of children: N/A  . Years of education: N/A   Occupational History  . Not on file.   Social History Main Topics  . Smoking status: Never Smoker  . Smokeless tobacco: Never Used  . Alcohol use No  . Drug use: No  . Sexual activity: Not on file   Other Topics Concern  . Not on file   Social History Narrative  . No narrative on file    Past Surgical History:  Procedure Laterality Date  .  ABDOMINAL HYSTERECTOMY  1981  . BREAST BIOPSY  1995/2005  . HERNIA REPAIR  2005  . TUBAL LIGATION  1975    Family History  Problem Relation Age of Onset  . Prostate cancer Neg Hx   . Kidney disease Neg Hx   . Bladder Cancer Neg Hx     Allergies  Allergen Reactions  . Morphine Other (See Comments)    Unknown  . Clindamycin/Lincomycin Diarrhea  . Methylprednisolone Swelling  . Codeine Palpitations and Other (See Comments)    unknown  . Morphine And Related Palpitations  . Penicillins Rash and Other (See Comments)    unknown    Current Outpatient Prescriptions on File Prior to Visit  Medication Sig Dispense Refill  . BIOTIN PO Take 1 capsule by mouth daily.    . cephALEXin (KEFLEX) 500 MG capsule Take 1 capsule (500 mg total) by mouth 4 (four) times daily. 40 capsule 0  . cholecalciferol (VITAMIN D) 1000 units tablet Take 1,000 Units by mouth daily.    . cyclobenzaprine (FLEXERIL) 10 MG tablet Take 0.5 tablets (5 mg total) by mouth at bedtime as needed for muscle spasms (sedation caution). 10 tablet 0  . ibuprofen (MOTRIN IB)  200 MG tablet Take 2 tablets (400 mg total) by mouth every 8 (eight) hours as needed (for pain, with food.).    Marland Kitchen Multiple Vitamin (MULTIVITAMIN) capsule Take 1 capsule by mouth daily.    . mupirocin ointment (BACTROBAN) 2 % Apply to affected area 3 times daily 22 g 0   No current facility-administered medications on file prior to visit.     BP (!) 148/94   Pulse 86   Temp 98 F (36.7 C) (Oral)   Ht 5\' 4"  (1.626 m)   Wt 246 lb 12.8 oz (111.9 kg)   SpO2 97%   BMI 42.36 kg/m    Objective:   Physical Exam  Constitutional: She appears well-nourished.  Cardiovascular: Normal rate and regular rhythm.   Pulmonary/Chest: Effort normal and breath sounds normal.  Skin: Skin is warm and dry.  Well-healing laceration with 7 sutures to later surface of right great toe. No erythema, swelling, discharge. Mildly tender upon palpation.            Assessment & Plan:  Suture removal:  Laceration sustained to plantar surface of right great toe 9 days ago. Exam today without evidence of acute infection. Site prepped with Betadine solution. 7 sutures removed without complication. Patient tolerated procedure fairly well. Dressing applied today. Discussed to soak foot in warm water as to loosen up crusted blood. Discussed symptoms of possible infection and to notify me if the symptoms develop.  Sheral Flow, NP

## 2016-08-10 NOTE — Progress Notes (Signed)
Pre visit review using our clinic review tool, if applicable. No additional management support is needed unless otherwise documented below in the visit note. 

## 2016-08-10 NOTE — Patient Instructions (Signed)
Soak your foot in warm water to help remove the dried blood and crusting.  Please notify me if you develop increased pain, redness, swelling.  It was a pleasure to see you today!

## 2016-08-17 ENCOUNTER — Ambulatory Visit (INDEPENDENT_AMBULATORY_CARE_PROVIDER_SITE_OTHER): Payer: Commercial Managed Care - HMO | Admitting: Podiatry

## 2016-08-17 ENCOUNTER — Encounter: Payer: Commercial Managed Care - HMO | Admitting: Occupational Therapy

## 2016-08-17 ENCOUNTER — Ambulatory Visit (INDEPENDENT_AMBULATORY_CARE_PROVIDER_SITE_OTHER): Payer: Commercial Managed Care - HMO

## 2016-08-17 ENCOUNTER — Encounter: Payer: Self-pay | Admitting: Podiatry

## 2016-08-17 DIAGNOSIS — S92911A Unspecified fracture of right toe(s), initial encounter for closed fracture: Secondary | ICD-10-CM

## 2016-08-17 MED ORDER — MUPIROCIN 2 % EX OINT
TOPICAL_OINTMENT | CUTANEOUS | 2 refills | Status: DC
Start: 2016-08-17 — End: 2016-09-29

## 2016-08-18 NOTE — Progress Notes (Signed)
She presents today for a follow-up of the first and second toes of the right foot. She was instructed to come here by the emergency department after having lacerated her first and second toes plantar aspect right foot. She states that she fail tearing the skin which had to be sutured. She denies fever chills nausea vomiting.  Objective: Vital signs are stable alert and oriented 3. Pulses are palpable. Lacerations abdominal to heal uneventfully however radiographs revealed today a fracture of the base of the second proximal phalanx nondisplaced with minimal comminution. First metatarsophalangeal joint implant appears to be in good position without signs of infection.  Assessment: Follow leading the laceration and fracture of the proximal phalanx second digit right foot.  Plan: I instructed her to maintain the use of a Darco shoe or short boot for the next 4 weeks. Follow-up with her at that time if necessary.

## 2016-08-19 ENCOUNTER — Ambulatory Visit: Payer: Commercial Managed Care - HMO | Admitting: Occupational Therapy

## 2016-09-15 ENCOUNTER — Ambulatory Visit (INDEPENDENT_AMBULATORY_CARE_PROVIDER_SITE_OTHER): Payer: Commercial Managed Care - HMO | Admitting: Vascular Surgery

## 2016-09-15 ENCOUNTER — Encounter (INDEPENDENT_AMBULATORY_CARE_PROVIDER_SITE_OTHER): Payer: Self-pay | Admitting: Vascular Surgery

## 2016-09-15 ENCOUNTER — Ambulatory Visit (INDEPENDENT_AMBULATORY_CARE_PROVIDER_SITE_OTHER): Payer: Commercial Managed Care - HMO

## 2016-09-15 ENCOUNTER — Other Ambulatory Visit (INDEPENDENT_AMBULATORY_CARE_PROVIDER_SITE_OTHER): Payer: Self-pay | Admitting: Vascular Surgery

## 2016-09-15 DIAGNOSIS — M7989 Other specified soft tissue disorders: Principal | ICD-10-CM

## 2016-09-15 DIAGNOSIS — M79661 Pain in right lower leg: Secondary | ICD-10-CM

## 2016-09-15 DIAGNOSIS — I872 Venous insufficiency (chronic) (peripheral): Secondary | ICD-10-CM | POA: Insufficient documentation

## 2016-09-15 DIAGNOSIS — M79662 Pain in left lower leg: Secondary | ICD-10-CM

## 2016-09-15 DIAGNOSIS — I890 Lymphedema, not elsewhere classified: Secondary | ICD-10-CM | POA: Insufficient documentation

## 2016-09-15 NOTE — Progress Notes (Signed)
Subjective:    Patient ID: Ann Barker, female    DOB: 1951/06/16, 65 y.o.   MRN: [id number] Chief Complaint  Patient presents with  . Venous Insufficiency    Venous duplex ultrasound follow up    Patient last seen in 07/13/16 for "lymphedema". Patient states she should be receiving her lymphedema pump later this month. She continues to wear her compression stockings and elevate her legs with improvement in her edema. She underwent a bilateral venous duplex today which was notable for No DVT or SVT bilaterally. Small area of reflux noted in the GSV bilaterally.    Review of Systems  Constitutional: Negative.   HENT: Negative.   Eyes: Negative.   Respiratory: Negative.   Cardiovascular: Positive for leg swelling.  Gastrointestinal: Negative.   Endocrine: Negative.   Genitourinary: Negative.   Musculoskeletal: Negative.   Skin: Negative.   Neurological: Negative.   Hematological: Negative.   Psychiatric/Behavioral: Negative.        Objective:   Physical Exam  Constitutional: She is oriented to person, place, and time. She appears well-developed and well-nourished.  HENT:  Head: Normocephalic and atraumatic.  Eyes: Conjunctivae and EOM are normal. Pupils are equal, round, and reactive to light.  Neck: Normal range of motion. Neck supple.  Cardiovascular: Normal rate, regular rhythm, normal heart sounds and intact distal pulses.   Pulmonary/Chest: Effort normal and breath sounds normal.  Abdominal: Soft. Bowel sounds are normal.  Musculoskeletal: Normal range of motion. She exhibits edema (2+ pitting edema primarily located at ankles).  Neurological: She is alert and oriented to person, place, and time. She has normal reflexes.  Skin: Skin is warm and dry.  Psychiatric: She has a normal mood and affect. Her behavior is normal. Judgment and thought content normal.    BP (!) 214/115 (BP Location: Right Arm)   Pulse 72   Resp 17   Ht 5' 4.5" (1.638 m)   Wt 249 lb (112.9  kg)   BMI 42.08 kg/m   Past Medical History:  Diagnosis Date  . Acid reflux   . Anemia   . Arthritis   . Dizziness   . Gallstones   . Hematuria    microscopic  . HLD (hyperlipidemia)   . Hoarseness   . Hypertension   . Lower extremity edema   . Over weight   . Renal cyst   . Urge incontinence   . UTI (lower urinary tract infection)   . Vitamin D deficiency     Social History   Social History  . Marital status: Married    Spouse name: N/A  . Number of children: N/A  . Years of education: N/A   Occupational History  . Not on file.   Social History Main Topics  . Smoking status: Never Smoker  . Smokeless tobacco: Never Used  . Alcohol use No  . Drug use: No  . Sexual activity: Not on file   Other Topics Concern  . Not on file   Social History Narrative  . No narrative on file    Past Surgical History:  Procedure Laterality Date  . ABDOMINAL HYSTERECTOMY  1981  . BREAST BIOPSY  1995/2005  . HERNIA REPAIR  2005  . TUBAL LIGATION  1975    Family History  Problem Relation Age of Onset  . Cancer Paternal Aunt   . Stroke Paternal Uncle   . Prostate cancer Neg Hx   . Kidney disease Neg Hx   . Bladder Cancer Neg  Hx     Allergies  Allergen Reactions  . Morphine Other (See Comments)    Unknown  . Clindamycin/Lincomycin Diarrhea  . Methylprednisolone Swelling  . Codeine Palpitations and Other (See Comments)    unknown  . Morphine And Related Palpitations  . Penicillins Rash and Other (See Comments)    unknown       Assessment & Plan:  Ann Barker was seen today for venous insufficiency.  Patient last seen in 07/13/16 for "lymphedema". Patient states she should be receiving her lymphedema pump later this month. She continues to wear her compression stockings and elevate her legs with improvement in her edema. She underwent a bilateral venous duplex today which was notable for No DVT or SVT bilaterally. Small area of reflux noted in the GSV bilaterally.    1. Lymphedema - Stable Patient will be receiving her lymphedema pump in the near future. We discussed how to use the pump. Patient to continue to wear compression and engage in elevation.   2. Chronic venous insufficiency - New Patient not interested in pursuing any venous treatment at this time. She would like to continue treatment with compression, elevation and the lymphedema pump,  3) Hypertension - Stable Encouraged good control as its slows the progression of atherosclerotic disease   Current Outpatient Prescriptions on File Prior to Visit  Medication Sig Dispense Refill  . BIOTIN PO Take 1 capsule by mouth daily.    . cholecalciferol (VITAMIN D) 1000 units tablet Take 1,000 Units by mouth daily.    . cyclobenzaprine (FLEXERIL) 10 MG tablet Take 0.5 tablets (5 mg total) by mouth at bedtime as needed for muscle spasms (sedation caution). 10 tablet 0  . ibuprofen (MOTRIN IB) 200 MG tablet Take 2 tablets (400 mg total) by mouth every 8 (eight) hours as needed (for pain, with food.).    Marland Kitchen Multiple Vitamin (MULTIVITAMIN) capsule Take 1 capsule by mouth daily.    . mupirocin ointment (BACTROBAN) 2 % Apply to affected area 3 times daily 22 g 0  . mupirocin ointment (BACTROBAN) 2 % Apply to wound twice a day. 30 g 2   No current facility-administered medications on file prior to visit.     There are no Patient Instructions on file for this visit. Return in about 3 months (around 12/16/2016) for Lymphedema Follow Up.   KIMBERLY A STEGMAYER, PA-C

## 2016-09-28 DIAGNOSIS — I890 Lymphedema, not elsewhere classified: Secondary | ICD-10-CM | POA: Diagnosis not present

## 2016-09-29 ENCOUNTER — Encounter: Payer: Self-pay | Admitting: Primary Care

## 2016-09-29 ENCOUNTER — Ambulatory Visit (INDEPENDENT_AMBULATORY_CARE_PROVIDER_SITE_OTHER)
Admission: RE | Admit: 2016-09-29 | Discharge: 2016-09-29 | Disposition: A | Discharge status: Home / Self Care | Payer: Commercial Managed Care - HMO | Source: Ambulatory Visit | Attending: Primary Care | Admitting: Primary Care

## 2016-09-29 ENCOUNTER — Ambulatory Visit (INDEPENDENT_AMBULATORY_CARE_PROVIDER_SITE_OTHER): Payer: Commercial Managed Care - HMO | Admitting: Primary Care

## 2016-09-29 VITALS — BP 140/92 | HR 84 | Temp 97.9°F | Ht 64.5 in | Wt 248.4 lb

## 2016-09-29 DIAGNOSIS — I10 Essential (primary) hypertension: Secondary | ICD-10-CM | POA: Diagnosis not present

## 2016-09-29 DIAGNOSIS — M542 Cervicalgia: Secondary | ICD-10-CM | POA: Diagnosis not present

## 2016-09-29 DIAGNOSIS — M25512 Pain in left shoulder: Secondary | ICD-10-CM | POA: Diagnosis not present

## 2016-09-29 DIAGNOSIS — M25522 Pain in left elbow: Secondary | ICD-10-CM | POA: Diagnosis not present

## 2016-09-29 DIAGNOSIS — I890 Lymphedema, not elsewhere classified: Secondary | ICD-10-CM | POA: Diagnosis not present

## 2016-09-29 MED ORDER — CYCLOBENZAPRINE HCL 5 MG PO TABS: 5.0000 mg | ORAL_TABLET | Freq: Three times a day (TID) | ORAL | 1 refills | Status: DC | PRN

## 2016-09-29 MED ORDER — HYDROCHLOROTHIAZIDE 12.5 MG PO CAPS: 12.5000 mg | ORAL_CAPSULE | Freq: Every day | ORAL | 0 refills | Status: DC
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
## 2016-09-29 NOTE — Progress Notes (Signed)
Pre visit review using our clinic review tool, if applicable. No additional management support is needed unless otherwise documented below in the visit note. 

## 2016-09-29 NOTE — Assessment & Plan Note (Signed)
Much improvement since establishment through the lymphedema clinic. Continue same.

## 2016-09-29 NOTE — Patient Instructions (Signed)
Start hydrochlorothiazide 12.5 mg capsules for high blood pressure. Take 1 capsule by mouth every morning.  You may take Flexeril up to three times daily as needed for neck and shoulder muscle spasm/pain. This medication may cause drowsiness.  Complete xray(s) prior to leaving today. I will notify you of your results once received.  Please monitor your blood pressure for the next 2 weeks, record your readings, and please schedule an appointment with me in 2 weeks for re-check of your blood pressure.  It was a pleasure to see you today!

## 2016-09-29 NOTE — Assessment & Plan Note (Signed)
Uncontrolled. Start HCTZ 12.5 mg capsules once daily. BMP in March 2017 stable.

## 2016-09-30 ENCOUNTER — Other Ambulatory Visit: Payer: Self-pay | Admitting: Primary Care

## 2016-09-30 DIAGNOSIS — M542 Cervicalgia: Principal | ICD-10-CM

## 2016-09-30 DIAGNOSIS — M25519 Pain in unspecified shoulder: Secondary | ICD-10-CM

## 2016-10-01 ENCOUNTER — Telehealth: Payer: Self-pay | Admitting: Primary Care

## 2016-10-01 NOTE — Telephone Encounter (Signed)
Pt is scheduled with dr patel 12/29/2016.  She wanted to know if you could message dr patel to get her seen sooner

## 2016-10-01 NOTE — Telephone Encounter (Signed)
Please dis regard wrong pt chart

## 2016-10-07 DIAGNOSIS — M7582 Other shoulder lesions, left shoulder: Secondary | ICD-10-CM | POA: Diagnosis not present

## 2016-10-07 DIAGNOSIS — S139XXA Sprain of joints and ligaments of unspecified parts of neck, initial encounter: Secondary | ICD-10-CM | POA: Diagnosis not present

## 2016-10-13 DIAGNOSIS — M47892 Other spondylosis, cervical region: Secondary | ICD-10-CM | POA: Diagnosis not present

## 2016-10-13 DIAGNOSIS — M542 Cervicalgia: Secondary | ICD-10-CM | POA: Diagnosis not present

## 2016-10-15 DIAGNOSIS — M47892 Other spondylosis, cervical region: Secondary | ICD-10-CM | POA: Diagnosis not present

## 2016-10-15 DIAGNOSIS — M542 Cervicalgia: Secondary | ICD-10-CM | POA: Diagnosis not present

## 2016-10-26 DIAGNOSIS — M47892 Other spondylosis, cervical region: Secondary | ICD-10-CM | POA: Diagnosis not present

## 2016-10-26 DIAGNOSIS — M542 Cervicalgia: Secondary | ICD-10-CM | POA: Diagnosis not present

## 2016-10-28 DIAGNOSIS — I890 Lymphedema, not elsewhere classified: Secondary | ICD-10-CM | POA: Diagnosis not present

## 2016-10-29 DIAGNOSIS — M542 Cervicalgia: Secondary | ICD-10-CM | POA: Diagnosis not present

## 2016-10-29 DIAGNOSIS — M47892 Other spondylosis, cervical region: Secondary | ICD-10-CM | POA: Diagnosis not present

## 2016-11-11 DIAGNOSIS — M542 Cervicalgia: Secondary | ICD-10-CM | POA: Diagnosis not present

## 2016-11-11 DIAGNOSIS — M47892 Other spondylosis, cervical region: Secondary | ICD-10-CM | POA: Diagnosis not present

## 2016-11-12 DIAGNOSIS — M7582 Other shoulder lesions, left shoulder: Secondary | ICD-10-CM | POA: Diagnosis not present

## 2016-11-12 DIAGNOSIS — M542 Cervicalgia: Secondary | ICD-10-CM | POA: Diagnosis not present

## 2016-11-27 DIAGNOSIS — I890 Lymphedema, not elsewhere classified: Secondary | ICD-10-CM | POA: Diagnosis not present

## 2016-12-17 ENCOUNTER — Ambulatory Visit (INDEPENDENT_AMBULATORY_CARE_PROVIDER_SITE_OTHER): Payer: Self-pay | Admitting: Vascular Surgery

## 2017-01-13 DIAGNOSIS — S060X9A Concussion with loss of consciousness of unspecified duration, initial encounter: Secondary | ICD-10-CM

## 2017-01-13 DIAGNOSIS — S060XAA Concussion with loss of consciousness status unknown, initial encounter: Secondary | ICD-10-CM

## 2017-01-13 HISTORY — DX: Concussion with loss of consciousness of unspecified duration, initial encounter: S06.0X9A

## 2017-01-13 HISTORY — DX: Concussion with loss of consciousness status unknown, initial encounter: S06.0XAA

## 2017-01-17 ENCOUNTER — Ambulatory Visit (INDEPENDENT_AMBULATORY_CARE_PROVIDER_SITE_OTHER): Payer: 59 | Admitting: Podiatry

## 2017-01-17 ENCOUNTER — Encounter: Payer: Self-pay | Admitting: Podiatry

## 2017-01-17 VITALS — BP 169/100 | HR 77 | Resp 16

## 2017-01-17 DIAGNOSIS — L603 Nail dystrophy: Secondary | ICD-10-CM

## 2017-01-17 DIAGNOSIS — S90211A Contusion of right great toe with damage to nail, initial encounter: Secondary | ICD-10-CM | POA: Diagnosis not present

## 2017-01-17 MED ORDER — MUPIROCIN CALCIUM 2 % EX CREA: 1.0000 "application " | TOPICAL_CREAM | Freq: Two times a day (BID) | CUTANEOUS | 1 refills | Status: DC

## 2017-01-18 ENCOUNTER — Encounter (HOSPITAL_COMMUNITY): Payer: Self-pay | Admitting: Emergency Medicine

## 2017-01-18 ENCOUNTER — Emergency Department (HOSPITAL_COMMUNITY): Payer: Medicare HMO

## 2017-01-18 ENCOUNTER — Emergency Department (HOSPITAL_COMMUNITY)
Admission: EM | Admit: 2017-01-18 | Discharge: 2017-01-18 | Disposition: A | Discharge status: Home / Self Care | Payer: Medicare HMO | Attending: Emergency Medicine | Admitting: Emergency Medicine

## 2017-01-18 ENCOUNTER — Telehealth: Payer: Self-pay | Admitting: *Deleted

## 2017-01-18 DIAGNOSIS — S0083XA Contusion of other part of head, initial encounter: Secondary | ICD-10-CM | POA: Insufficient documentation

## 2017-01-18 DIAGNOSIS — S4992XA Unspecified injury of left shoulder and upper arm, initial encounter: Secondary | ICD-10-CM | POA: Diagnosis not present

## 2017-01-18 DIAGNOSIS — Y999 Unspecified external cause status: Secondary | ICD-10-CM | POA: Diagnosis not present

## 2017-01-18 DIAGNOSIS — R69 Illness, unspecified: Secondary | ICD-10-CM | POA: Diagnosis not present

## 2017-01-18 DIAGNOSIS — R110 Nausea: Secondary | ICD-10-CM | POA: Insufficient documentation

## 2017-01-18 DIAGNOSIS — Y929 Unspecified place or not applicable: Secondary | ICD-10-CM | POA: Diagnosis not present

## 2017-01-18 DIAGNOSIS — Y939 Activity, unspecified: Secondary | ICD-10-CM | POA: Insufficient documentation

## 2017-01-18 DIAGNOSIS — I10 Essential (primary) hypertension: Secondary | ICD-10-CM | POA: Insufficient documentation

## 2017-01-18 DIAGNOSIS — S0993XA Unspecified injury of face, initial encounter: Secondary | ICD-10-CM | POA: Diagnosis present

## 2017-01-18 DIAGNOSIS — M25512 Pain in left shoulder: Secondary | ICD-10-CM | POA: Insufficient documentation

## 2017-01-18 DIAGNOSIS — T07XXXA Unspecified multiple injuries, initial encounter: Secondary | ICD-10-CM

## 2017-01-18 MED ORDER — MUPIROCIN 2 % EX OINT: 1.0000 "application " | TOPICAL_OINTMENT | Freq: Two times a day (BID) | CUTANEOUS | 0 refills | Status: DC
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
## 2017-01-18 NOTE — Discharge Instructions (Signed)
Use ice on the sore areas 3 times a day for 2 days.  Take ibuprofen for pain.  Return here or see your Dr. for problems.
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
## 2017-01-18 NOTE — Telephone Encounter (Signed)
Received request for alternative to Mupirocin cream. Pt's insurance will cover Mupirocin ointment. Dr. Trinidad Curet the change.

## 2017-01-23 NOTE — Progress Notes (Signed)
   Subjective:  Patient presents today for evaluation of right great toe pain 3 months. Patient states that he has bleeding in the toenail. He also states the toe is numb. He denies trauma however he does experience bleeding on and off.    Objective/Physical Exam General: The patient is alert and oriented x3 in no acute distress.  Dermatology: Partially detached, dystrophic nail noted to the right great toe. There does appear to be subungual hematoma which is dry. Skin is warm, dry and supple bilateral lower extremities. Negative for open lesions or macerations.  Vascular: Palpable pedal pulses bilaterally. No edema or erythema noted. Capillary refill within normal limits.  Neurological: Epicritic and protective threshold grossly intact bilaterally.   Musculoskeletal Exam: Range of motion within normal limits to all pedal and ankle joints bilateral. Muscle strength 5/5 in all groups bilateral.   Assessment: #1 dystrophic nail with subungual hematoma right great toe   Plan of Care:  #1 Patient was evaluated. #2 mechanical debridement of the right great toenails performed using a nail nipper without incident. #3 prescription for mupirocin 2% Ointment  #4 return to clinic when necessary   Edrick Kins, DPM [facility]  Dr. Edrick Kins, [address]                                        [place], [place] [address]                Office [phone]  Fax [phone]

## 2017-01-25 ENCOUNTER — Ambulatory Visit (INDEPENDENT_AMBULATORY_CARE_PROVIDER_SITE_OTHER): Payer: Medicare HMO | Admitting: Primary Care

## 2017-01-25 ENCOUNTER — Encounter: Payer: Self-pay | Admitting: Primary Care

## 2017-01-25 VITALS — BP 106/70 | HR 80 | Temp 97.8°F | Wt 243.0 lb

## 2017-01-25 DIAGNOSIS — E559 Vitamin D deficiency, unspecified: Secondary | ICD-10-CM

## 2017-01-25 NOTE — Patient Instructions (Signed)
Your xray shows evidence of possible osteoporosis in your bones. You need to complete a bone density test which tests for osteoporosis.   Complete lab work prior to leaving today. I will notify you of your results once received.   Please call me next Monday or go to the hospital if you develop headaches (the worst headache of your life), no improvements/increase in dizziness, you start falling, you have no improvement with your memory.  It may take 1-2 weeks for your symptoms to improve. It's important to rest your brain.  It was a pleasure to see you today!   Concussion, Adult A concussion is a brain injury. It is caused by:  A hit to the head.  A quick and sudden movement (jolt) of the head or neck. A concussion is usually not life threatening. Even so, it can cause serious problems. If you had a concussion before, you may have concussion-like problems after a hit to your head. Follow these instructions at home: General instructions  Follow your doctor's directions carefully.  Take medicines only as told by your doctor.  Only take medicines your doctor says are safe.  Do not drink alcohol until your doctor says it is okay. Alcohol and some drugs can slow down healing. They can also put you at risk for further injury.  If you are having trouble remembering things, write them down.  Try to do one thing at a time if you get distracted easily. For example, do not watch TV while making dinner.  Talk to your family members or close friends when making important decisions.  Follow up with your doctor as told.  Watch your symptoms. Tell others to do the same. Serious problems can sometimes happen after a concussion. Older adults are more likely to have these problems.  Tell your teachers, school nurse, school counselor, coach, Product/process development scientist, or work Freight forwarder about your concussion. Tell them about what you can or cannot do. They should watch to see if:  It gets even harder for you  to pay attention or concentrate.  It gets even harder for you to remember things or learn new things.  You need more time than normal to finish things.  You become annoyed (irritable) more than before.  You are not able to deal with stress as well.  You have more problems than before.  Rest. Make sure you:  Get plenty of sleep at night.  Go to sleep early.  Go to bed at the same time every day. Try to wake up at the same time.  Rest during the day.  Take naps when you feel tired.  Limit activities where you have to think a lot or concentrate. These include:  Doing homework.  Doing work related to a job.  Watching TV.  Using the computer. Returning To Your Regular Activities  Return to your normal activities slowly, not all at once. You must give your body and brain enough time to heal.  Do not play sports or do other athletic activities until your doctor says it is okay.  Ask your doctor when you can drive, ride a bicycle, or work other vehicles or machines. Never do these things if you feel dizzy.  Ask your doctor about when you can return to work or school. Preventing Another Concussion  It is very important to avoid another brain injury, especially before you have healed. In rare cases, another injury can lead to permanent brain damage, brain swelling, or death. The risk of this  is greatest during the first 7-10 days after your injury. Avoid injuries by:  Wearing a seat belt when riding in a car.  Not drinking too much alcohol.  Avoiding activities that could lead to a second concussion (such as contact sports).  Wearing a helmet when doing activities like:  Biking.  Skiing.  Skateboarding.  Skating.  Making your home safer by:  Removing things from the floor or stairways that could make you trip.  Using grab bars in bathrooms and handrails by stairs.  Placing non-slip mats on floors and in bathtubs.  Improve lighting in dark areas. Contact a  doctor if:  It gets even harder for you to pay attention or concentrate.  It gets even harder for you to remember things or learn new things.  You need more time than normal to finish things.  You become annoyed (irritable) more than before.  You are not able to deal with stress as well.  You have more problems than before.  You have problems keeping your balance.  You are not able to react quickly when you should. Get help if you have any of these problems for more than 2 weeks:  Lasting (chronic) headaches.  Dizziness or trouble balancing.  Feeling sick to your stomach (nausea).  Seeing (vision) problems.  Being affected by noises or light more than normal.  Feeling sad, low, down in the dumps, blue, gloomy, or empty (depressed).  Mood changes (mood swings).  Feeling of fear or nervousness about what may happen (anxiety).  Feeling annoyed.  Memory problems.  Problems concentrating or paying attention.  Sleep problems.  Feeling tired all the time. Get help right away if:  You have bad headaches or your headaches get worse.  You have weakness (even if it is in one hand, leg, or part of the face).  You have loss of feeling (numbness).  You feel off balance.  You keep throwing up (vomiting).  You feel tired.  One black center of your eye (pupil) is larger than the other.  You twitch or shake violently (convulse).  Your speech is not clear (slurred).  You are more confused, easily angered (agitated), or annoyed than before.  You have more trouble resting than before.  You are unable to recognize people or places.  You have neck pain.  It is difficult to wake you up.  You have unusual behavior changes.  You pass out (lose consciousness). This information is not intended to replace advice given to you by your health care provider. Make sure you discuss any questions you have with your health care provider. Document Released: 11/17/2009 Document  Revised: 05/06/2016 Document Reviewed: 06/21/2013 Elsevier Interactive Patient Education  2017 [employer].
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
## 2017-01-25 NOTE — Progress Notes (Signed)
Pre visit review using our clinic review tool, if applicable. No additional management support is needed unless otherwise documented below in the visit note. 

## 2017-01-26 LAB — VITAMIN D 25 HYDROXY (VIT D DEFICIENCY, FRACTURES): VITD: 31.83 ng/mL (ref 30.00–100.00)

## 2017-02-11 ENCOUNTER — Telehealth: Payer: Self-pay | Admitting: Primary Care

## 2017-02-11 NOTE — Telephone Encounter (Signed)
Pt will call back and schedule AWV + labs with Katha Cabal and CPE with PCP.

## 2017-02-14 ENCOUNTER — Telehealth: Payer: Self-pay | Admitting: Primary Care

## 2017-02-14 NOTE — Telephone Encounter (Signed)
Patient Name: Ann Barker  DOB: 1951-05-11    Initial Comment Caller states she has been getting dizzy. Gas line was checked, not the issue.   Nurse Assessment  Nurse: Raphael Gibney, RN, Vanita Ingles Date/Time (Eastern Time): 02/14/2017 11:48:20 AM  Confirm and document reason for call. If symptomatic, describe symptoms. ---Caller states she is dizzy. She was falling. Had gas line checked and no leak. Said she smelled gas. She has been nauseated . her balance is off. Feels dizziness in the left side of her head. no pain. No injury from fall. Dizziness started yesterday.  Does the patient have any new or worsening symptoms? ---Yes  Will a triage be completed? ---Yes  Related visit to physician within the last 2 weeks? ---No  Does the PT have any chronic conditions? (i.e. diabetes, asthma, etc.) ---No  Is this a behavioral health or substance abuse call? ---No     Guidelines    Guideline Title Affirmed Question Affirmed Notes  Dizziness - Lightheadedness SEVERE dizziness (e.g., unable to stand, requires support to walk, feels like passing out now)    Final Disposition User   Go to ED Now (or PCP triage) Raphael Gibney, RN, Vera    Referrals  [facility] - ED  [place] UNDECIDED   Disagree/Comply: Comply

## 2017-02-14 NOTE — Telephone Encounter (Signed)
Noted  

## 2017-02-14 NOTE — Telephone Encounter (Signed)
I called pt but was unable to reach pt by phone.

## 2017-02-14 NOTE — Telephone Encounter (Signed)
Please try to reach patient, get some details. Has this been going on since I last saw her in the office?

## 2017-02-14 NOTE — Telephone Encounter (Signed)
Patient stated that she is getting better. Believe that the dizziness comes from the smell out of the bathroom.  Patient wanted to be check out so schedule appt on 02/15/2017  Patient stated that she is doing okay since the last visit.

## 2017-02-15 ENCOUNTER — Ambulatory Visit (INDEPENDENT_AMBULATORY_CARE_PROVIDER_SITE_OTHER): Payer: Medicare HMO | Admitting: Primary Care

## 2017-02-15 ENCOUNTER — Encounter: Payer: Self-pay | Admitting: Primary Care

## 2017-02-15 VITALS — BP 142/90 | HR 89 | Temp 97.9°F | Ht 64.5 in | Wt 239.4 lb

## 2017-02-15 DIAGNOSIS — R6889 Other general symptoms and signs: Secondary | ICD-10-CM

## 2017-02-15 DIAGNOSIS — S0990XS Unspecified injury of head, sequela: Secondary | ICD-10-CM

## 2017-02-15 DIAGNOSIS — G44309 Post-traumatic headache, unspecified, not intractable: Secondary | ICD-10-CM | POA: Diagnosis not present

## 2017-02-15 NOTE — Progress Notes (Signed)
Pre visit review using our clinic review tool, if applicable. No additional management support is needed unless otherwise documented below in the visit note. 
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
## 2017-02-15 NOTE — Patient Instructions (Signed)
The dizziness you have sounds like vertigo.  Try meclizine 25 mg tablets for vertigo, this may be purchased over the counter. Caution as this may cause drowsiness.   Stop by the front desk and speak with either Rosaria Ferries or Shirlean Mylar regarding your CT scan.  I strongly encourage you to see the therapist in [place] as discussed.  It was a pleasure to see you today!

## 2017-02-18 ENCOUNTER — Ambulatory Visit (INDEPENDENT_AMBULATORY_CARE_PROVIDER_SITE_OTHER)
Admission: RE | Admit: 2017-02-18 | Discharge: 2017-02-18 | Disposition: A | Discharge status: Home / Self Care | Payer: Medicare HMO | Source: Ambulatory Visit | Attending: Primary Care | Admitting: Primary Care

## 2017-02-18 DIAGNOSIS — R6889 Other general symptoms and signs: Secondary | ICD-10-CM

## 2017-02-18 DIAGNOSIS — G44309 Post-traumatic headache, unspecified, not intractable: Secondary | ICD-10-CM

## 2017-02-18 DIAGNOSIS — R51 Headache: Secondary | ICD-10-CM | POA: Diagnosis not present

## 2017-02-18 DIAGNOSIS — S0990XS Unspecified injury of head, sequela: Secondary | ICD-10-CM

## 2017-03-15 NOTE — Telephone Encounter (Signed)
Spoke to pt again. Pt will call back and schedule AWV + labs with Katha Cabal and CPE with PCP.

## 2017-04-07 NOTE — Telephone Encounter (Signed)
Patient scheduled awv on 04/08/17 and cpx on 04/14/17.

## 2017-04-08 ENCOUNTER — Ambulatory Visit (INDEPENDENT_AMBULATORY_CARE_PROVIDER_SITE_OTHER): Payer: Medicare HMO

## 2017-04-08 VITALS — BP 150/108 | HR 87 | Temp 98.5°F | Ht 64.0 in | Wt 240.5 lb

## 2017-04-08 DIAGNOSIS — I10 Essential (primary) hypertension: Secondary | ICD-10-CM | POA: Diagnosis not present

## 2017-04-08 DIAGNOSIS — Z6841 Body Mass Index (BMI) 40.0 and over, adult: Secondary | ICD-10-CM | POA: Diagnosis not present

## 2017-04-08 DIAGNOSIS — Z1159 Encounter for screening for other viral diseases: Secondary | ICD-10-CM

## 2017-04-08 DIAGNOSIS — Z0000 Encounter for general adult medical examination without abnormal findings: Secondary | ICD-10-CM | POA: Diagnosis not present

## 2017-04-08 DIAGNOSIS — E559 Vitamin D deficiency, unspecified: Secondary | ICD-10-CM

## 2017-04-08 DIAGNOSIS — D649 Anemia, unspecified: Secondary | ICD-10-CM

## 2017-04-08 LAB — CBC WITH DIFFERENTIAL/PLATELET
Basophils Absolute: 0.1 10*3/uL (ref 0.0–0.1)
Basophils Relative: 0.9 % (ref 0.0–3.0)
Eosinophils Absolute: 0.2 10*3/uL (ref 0.0–0.7)
Eosinophils Relative: 2.1 % (ref 0.0–5.0)
HCT: 39 % (ref 36.0–46.0)
Hemoglobin: 12.6 g/dL (ref 12.0–15.0)
Lymphocytes Relative: 30.4 % (ref 12.0–46.0)
Lymphs Abs: 2.7 10*3/uL (ref 0.7–4.0)
MCHC: 32.3 g/dL (ref 30.0–36.0)
MCV: 87.5 fl (ref 78.0–100.0)
Monocytes Absolute: 0.7 10*3/uL (ref 0.1–1.0)
Monocytes Relative: 7.9 % (ref 3.0–12.0)
Neutro Abs: 5.1 10*3/uL (ref 1.4–7.7)
Neutrophils Relative %: 58.7 % (ref 43.0–77.0)
Platelets: 261 10*3/uL (ref 150.0–400.0)
RBC: 4.46 Mil/uL (ref 3.87–5.11)
RDW: 14.1 % (ref 11.5–15.5)
WBC: 8.7 10*3/uL (ref 4.0–10.5)

## 2017-04-08 LAB — COMPREHENSIVE METABOLIC PANEL
ALT: 19 U/L (ref 0–35)
AST: 18 U/L (ref 0–37)
Albumin: 3.8 g/dL (ref 3.5–5.2)
Alkaline Phosphatase: 81 U/L (ref 39–117)
BUN: 19 mg/dL (ref 6–23)
CO2: 25 mEq/L (ref 19–32)
Calcium: 9.6 mg/dL (ref 8.4–10.5)
Chloride: 106 mEq/L (ref 96–112)
Creatinine, Ser: 0.92 mg/dL (ref 0.40–1.20)
GFR: 78.14 mL/min (ref >60.00)
Glucose, Bld: 125 mg/dL — ABNORMAL HIGH (ref 70–99)
Potassium: 3.9 mEq/L (ref 3.5–5.1)
Sodium: 139 mEq/L (ref 135–145)
Total Bilirubin: 0.4 mg/dL (ref 0.2–1.2)
Total Protein: 7.2 g/dL (ref 6.0–8.3)

## 2017-04-08 LAB — LIPID PANEL
Cholesterol: 212 mg/dL — ABNORMAL HIGH (ref 0–200)
HDL: 69.7 mg/dL (ref >39.00)
LDL Cholesterol: 106 mg/dL — ABNORMAL HIGH (ref 0–99)
NonHDL: 142.64
Total CHOL/HDL Ratio: 3
Triglycerides: 185 mg/dL — ABNORMAL HIGH (ref 0.0–149.0)
VLDL: 37 mg/dL (ref 0.0–40.0)

## 2017-04-08 LAB — VITAMIN D 25 HYDROXY (VIT D DEFICIENCY, FRACTURES): VITD: 42.62 ng/mL (ref 30.00–100.00)

## 2017-04-08 NOTE — Progress Notes (Signed)
Subjective:   Ann Barker is a 66 y.o. female who presents for an Initial Medicare Annual Wellness Visit.  Review of Systems    N/A  Cardiac Risk Factors include: advanced age (>37men, >64 women);obesity (BMI >30kg/m2);hypertension     Objective:    Today's Vitals   04/08/17 1322  BP: (!) 150/108  Pulse: 87  Temp: 98.5 F (36.9 C)  TempSrc: Oral  SpO2: 98%  Weight: 240 lb 8 oz (109.1 kg)  Height: 5\' 4"  (1.626 m)  PainSc: 0-No pain   Body mass index is 41.28 kg/m.   Current Medications (verified) Outpatient Encounter Prescriptions as of 04/08/2017  Medication Sig  . BIOTIN PO Take 1 capsule by mouth daily.  Marland Kitchen CALCIUM PO Take by mouth.  . cholecalciferol (VITAMIN D) 1000 units tablet Take 1,000 Units by mouth daily.  . cyclobenzaprine (FLEXERIL) 5 MG tablet Take 1 tablet (5 mg total) by mouth 3 (three) times daily as needed for muscle spasms.  . hydrochlorothiazide (MICROZIDE) 12.5 MG capsule Take 1 capsule (12.5 mg total) by mouth daily.  Marland Kitchen ibuprofen (MOTRIN IB) 200 MG tablet Take 2 tablets (400 mg total) by mouth every 8 (eight) hours as needed (for pain, with food.).  Marland Kitchen Multiple Vitamin (MULTIVITAMIN) capsule Take 1 capsule by mouth daily.  . mupirocin ointment (BACTROBAN) 2 % Apply 1 application topically 2 (two) times daily.   No facility-administered encounter medications on file as of 04/08/2017.     Allergies (verified) Morphine; Clindamycin/lincomycin; Methylprednisolone; Codeine; Morphine and related; and Penicillins   History: Past Medical History:  Diagnosis Date  . Acid reflux   . Anemia   . Arthritis   . Dizziness   . Gallstones   . Hematuria    microscopic  . HLD (hyperlipidemia)   . Hoarseness   . Hypertension   . Lower extremity edema   . Over weight   . Renal cyst   . Urge incontinence   . UTI (lower urinary tract infection)   . Vitamin D deficiency    Past Surgical History:  Procedure Laterality Date  . ABDOMINAL HYSTERECTOMY   1981  . BREAST BIOPSY  1995/2005  . HERNIA REPAIR  2005  . TUBAL LIGATION  1975   Family History  Problem Relation Age of Onset  . Cancer Paternal Aunt   . Stroke Paternal Uncle   . Prostate cancer Neg Hx   . Kidney disease Neg Hx   . Bladder Cancer Neg Hx    Social History   Occupational History  . Not on file.   Social History Main Topics  . Smoking status: Never Smoker  . Smokeless tobacco: Never Used  . Alcohol use No  . Drug use: No  . Sexual activity: Not on file    Tobacco Counseling Counseling given: No   Activities of Daily Living In your present state of health, do you have any difficulty performing the following activities: 04/08/2017  Hearing? N  Vision? N  Difficulty concentrating or making decisions? Y  Walking or climbing stairs? N  Dressing or bathing? N  Doing errands, shopping? N  Preparing Food and eating ? N  Using the Toilet? N  In the past six months, have you accidently leaked urine? Y  Do you have problems with loss of bowel control? N  Managing your Medications? N  Managing your Finances? N  Housekeeping or managing your Housekeeping? N  Some recent data might be hidden    Immunizations and Health Maintenance Immunization  History  Administered Date(s) Administered  . Tdap 08/01/2016   There are no preventive care reminders to display for this patient.  Patient Care Team: Pleas Koch, NP as PCP - General (Nurse Practitioner)     Assessment:   This is a routine wellness examination for Ann Barker.   Hearing/Vision screen  Hearing Screening   125Hz  250Hz  500Hz  1000Hz  2000Hz  3000Hz  4000Hz  6000Hz  8000Hz   Right ear:   40 40 40  40    Left ear:   40 40 40  40      Visual Acuity Screening   Right eye Left eye Both eyes  Without correction: 20/25-1 20/25-1 20/25-1  With correction:       Dietary issues and exercise activities discussed: Current Exercise Habits: The patient does not participate in regular exercise at present,  Exercise limited by: None identified  Goals    . Increase water intake          Starting 04/08/17, I will continue to drink 3-4 12oz bottles of water daily.       Depression Screen PHQ 2/9 Scores 04/08/2017  PHQ - 2 Score 0    Fall Risk Fall Risk  04/08/2017  Falls in the past year? Yes  Number falls in past yr: 1  Injury with Fall? Yes    Cognitive Function: MMSE - Mini Mental State Exam 04/08/2017  Orientation to time 5  Orientation to Place 5  Registration 3  Attention/ Calculation 0  Recall 3  Language- name 2 objects 0  Language- repeat 1  Language- follow 3 step command 3  Language- read & follow direction 0  Write a sentence 0  Copy design 0  Total score 20     PLEASE NOTE: A Mini-Cog screen was completed. Maximum score is 20. A value of 0 denotes this part of Folstein MMSE was not completed or the patient failed this part of the Mini-Cog screening.   Mini-Cog Screening Orientation to Time - Max 5 pts Orientation to Place - Max 5 pts Registration - Max 3 pts Recall - Max 3 pts Language Repeat - Max 1 pts Language Follow 3 Step Command - Max 3 pts     Screening Tests Health Maintenance  Topic Date Due  . MAMMOGRAM  04/11/2018 (Originally 07/20/1999)  . DEXA SCAN  04/11/2018 (Originally 07/19/2014)  . COLONOSCOPY  04/11/2018 (Originally 07/20/1999)  . PNA vac Low Risk Adult (1 of 2 - PCV13) 04/11/2018 (Originally 07/19/2014)  . OT PLAN OF CARE  04/11/2026 (Originally August 27, 1951)  . INFLUENZA VACCINE  07/13/2017  . TETANUS/TDAP  08/01/2026  . Hepatitis C Screening  Completed      Plan:     I have personally reviewed and addressed the Medicare Annual Wellness questionnaire and have noted the following in the patient's chart:  A. Medical and social history B. Use of alcohol, tobacco or illicit drugs  C. Current medications and supplements D. Functional ability and status E.  Nutritional status F.  Physical activity G. Advance directives H. List of other  physicians I.  Hospitalizations, surgeries, and ER visits in previous 12 months J.  [place] to include hearing, vision, cognitive, depression L. Referrals and appointments - none  In addition, I have reviewed and discussed with patient certain preventive protocols, quality metrics, and best practice recommendations. A written personalized care plan for preventive services as well as general preventive health recommendations were provided to patient.  See attached scanned questionnaire for additional information.   Signed,  Lindell Noe, MHA, BS, LPN Health Coach

## 2017-04-08 NOTE — Patient Instructions (Signed)
[name] , Thank you for taking time to come for your Medicare Wellness Visit. I appreciate your ongoing commitment to your health goals. Please review the following plan we discussed and let me know if I can assist you in the future.   These are the goals we discussed: Goals    . Increase water intake          Starting 04/08/17, I will continue to drink 3-4 12oz bottles of water daily.        This is a list of the screening recommended for you and due dates:  Health Maintenance  Topic Date Due  . Mammogram  04/11/2018*  . DEXA scan (bone density measurement)  04/11/2018*  . Colon Cancer Screening  04/11/2018*  . Pneumonia vaccines (1 of 2 - PCV13) 04/11/2018*  . OT PLAN OF CARE  04/11/2026*  . Flu Shot  07/13/2017  . Tetanus Vaccine  08/01/2026  .  Hepatitis C: One time screening is recommended by Center for Disease Control  (CDC) for  adults born from [birth year] through 1967.   Completed  *Topic was postponed. The date shown is not the original due date.   Preventive Care for Adults  A healthy lifestyle and preventive care can promote health and wellness. Preventive health guidelines for adults include the following key practices.  . A routine yearly physical is a good way to check with your health care provider about your health and preventive screening. It is a chance to share any concerns and updates on your health and to receive a thorough exam.  . Visit your dentist for a routine exam and preventive care every 6 months. Brush your teeth twice a day and floss once a day. Good oral hygiene prevents tooth decay and gum disease.  . The frequency of eye exams is based on your age, health, family medical history, use  of contact lenses, and other factors. Follow your health care provider's ecommendations for frequency of eye exams.  . Eat a healthy diet. Foods like vegetables, fruits, whole grains, low-fat dairy products, and lean protein foods contain the nutrients you need without  too many calories. Decrease your intake of foods high in solid fats, added sugars, and salt. Eat the right amount of calories for you. Get information about a proper diet from your health care provider, if necessary.  . Regular physical exercise is one of the most important things you can do for your health. Most adults should get at least 150 minutes of moderate-intensity exercise (any activity that increases your heart rate and causes you to sweat) each week. In addition, most adults need muscle-strengthening exercises on 2 or more days a week.  Silver Sneakers may be a benefit available to you. To determine eligibility, you may visit the website: www.silversneakers.com or contact program at (301)541-1641 Mon-Fri between 8AM-8PM.   . Maintain a healthy weight. The body mass index (BMI) is a screening tool to identify possible weight problems. It provides an estimate of body fat based on height and weight. Your health care provider can find your BMI and can help you achieve or maintain a healthy weight.   For adults 20 years and older: ? A BMI below 18.5 is considered underweight. ? A BMI of 18.5 to 24.9 is normal. ? A BMI of 25 to 29.9 is considered overweight. ? A BMI of 30 and above is considered obese.   . Maintain normal blood lipids and cholesterol levels by exercising and minimizing your intake of  saturated fat. Eat a balanced diet with plenty of fruit and vegetables. Blood tests for lipids and cholesterol should begin at age 65 and be repeated every 5 years. If your lipid or cholesterol levels are high, you are over 50, or you are at high risk for heart disease, you may need your cholesterol levels checked more frequently. Ongoing high lipid and cholesterol levels should be treated with medicines if diet and exercise are not working.  . If you smoke, find out from your health care provider how to quit. If you do not use tobacco, please do not start.  . If you choose to drink alcohol,  please do not consume more than 2 drinks per day. One drink is considered to be 12 ounces (355 mL) of beer, 5 ounces (148 mL) of wine, or 1.5 ounces (44 mL) of liquor.  . If you are 43-32 years old, ask your health care provider if you should take aspirin to prevent strokes.  . Use sunscreen. Apply sunscreen liberally and repeatedly throughout the day. You should seek shade when your shadow is shorter than you. Protect yourself by wearing long sleeves, pants, a wide-brimmed hat, and sunglasses year round, whenever you are outdoors.  . Once a month, do a whole body skin exam, using a mirror to look at the skin on your back. Tell your health care provider of new moles, moles that have irregular borders, moles that are larger than a pencil eraser, or moles that have changed in shape or color.

## 2017-04-08 NOTE — Progress Notes (Signed)
Pre visit review using our clinic review tool, if applicable. No additional management support is needed unless otherwise documented below in the visit note. 
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
## 2017-04-09 LAB — HEPATITIS C ANTIBODY: HCV Ab: NEGATIVE

## 2017-04-10 NOTE — Progress Notes (Signed)
I reviewed health advisor's note, was available for consultation, and agree with documentation and plan.  

## 2017-04-11 ENCOUNTER — Other Ambulatory Visit (INDEPENDENT_AMBULATORY_CARE_PROVIDER_SITE_OTHER): Payer: Medicare HMO

## 2017-04-11 DIAGNOSIS — R7309 Other abnormal glucose: Secondary | ICD-10-CM | POA: Diagnosis not present

## 2017-04-11 LAB — HEMOGLOBIN A1C: Hgb A1c MFr Bld: 5.9 % (ref 4.6–6.5)

## 2017-04-14 ENCOUNTER — Encounter: Payer: Self-pay | Admitting: *Deleted

## 2017-04-14 ENCOUNTER — Encounter: Payer: Self-pay | Admitting: Primary Care

## 2017-04-14 ENCOUNTER — Ambulatory Visit (INDEPENDENT_AMBULATORY_CARE_PROVIDER_SITE_OTHER): Payer: Medicare HMO | Admitting: Primary Care

## 2017-04-14 VITALS — BP 126/82 | HR 78 | Temp 98.1°F | Ht 64.0 in | Wt 239.8 lb

## 2017-04-14 DIAGNOSIS — R7303 Prediabetes: Secondary | ICD-10-CM

## 2017-04-14 DIAGNOSIS — E785 Hyperlipidemia, unspecified: Secondary | ICD-10-CM | POA: Insufficient documentation

## 2017-04-14 DIAGNOSIS — Z1239 Encounter for other screening for malignant neoplasm of breast: Secondary | ICD-10-CM

## 2017-04-14 DIAGNOSIS — Z1231 Encounter for screening mammogram for malignant neoplasm of breast: Secondary | ICD-10-CM

## 2017-04-14 DIAGNOSIS — E2839 Other primary ovarian failure: Secondary | ICD-10-CM

## 2017-04-14 DIAGNOSIS — R519 Headache, unspecified: Secondary | ICD-10-CM

## 2017-04-14 DIAGNOSIS — R51 Headache: Secondary | ICD-10-CM | POA: Diagnosis not present

## 2017-04-14 DIAGNOSIS — I10 Essential (primary) hypertension: Secondary | ICD-10-CM

## 2017-04-14 NOTE — Assessment & Plan Note (Signed)
Stable in the office today, continue hydrochlorothiazide 12.5 mg.

## 2017-04-14 NOTE — Assessment & Plan Note (Signed)
TC of 212, trigs of 185. LDL stable. Discussed the importance of a healthy diet and regular exercise in order for weight loss, and to reduce the risk of other medical diseases. Repeat in 6 months.

## 2017-04-14 NOTE — Assessment & Plan Note (Addendum)
Noted on recent labs.. Discussed the importance of a healthy diet and regular exercise in order for weight loss, and to reduce the risk of other medical diseases. Repeat A1c in 6 months.
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
## 2017-04-14 NOTE — Patient Instructions (Signed)
Call the [facility] at [facility] to schedule your mammogram and Bone Density tests.  It's important to improve your diet by reducing consumption of fast food, fried food, processed snack foods, sugary drinks. Increase consumption of fresh vegetables and fruits, whole grains, water.  Ensure you are drinking 64 ounces of water daily.  Start exercising. You should be getting 150 minutes of moderate intensity exercise weekly.  Schedule a lab only appointment in 6 months to recheck your cholesterol and blood sugar.  Follow up in 1 year for re-evaluation or sooner if needed.  It was a pleasure to see you today!

## 2017-04-14 NOTE — Assessment & Plan Note (Signed)
Much improved. Neuro exam unremarkable today. Continue to monitor.

## 2017-04-14 NOTE — Progress Notes (Signed)
Pre visit review using our clinic review tool, if applicable. No additional management support is needed unless otherwise documented below in the visit note. 

## 2017-04-18 ENCOUNTER — Telehealth: Payer: Self-pay | Admitting: Primary Care

## 2017-04-18 NOTE — Telephone Encounter (Signed)
Patient returned Chan's call. °

## 2017-04-22 NOTE — Telephone Encounter (Signed)
Discard message. I did not call patient.

## 2017-05-26 ENCOUNTER — Telehealth: Payer: Self-pay

## 2017-05-26 NOTE — Telephone Encounter (Signed)
Pt received call last week about a wt loss patch called Med diet patch that pt could get; call was from [employer] weight loss. The [employer] wt loss # is [phone]. I called and spoke with Lovey Newcomer and she said the Med diet patch is a herbal supplement patch that does not require a rx and herbal supplements are not regulated by the FDA or any one else. Can look at the web site [employer] http://huff.com/. Pt wants to know Allie Bossier NP opinion about pt wearing this patch. Pt request cb.

## 2017-05-26 NOTE — Telephone Encounter (Signed)
I generally don't recommend weight loss supplements. Exercise and diet are my recommendations.

## 2017-05-27 NOTE — Telephone Encounter (Signed)
Per DPR, left detail message of Kate's comments for patient. 

## 2017-08-01 ENCOUNTER — Telehealth: Payer: Self-pay | Admitting: Primary Care
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
## 2017-08-01 NOTE — Telephone Encounter (Signed)
Pt has appt with Allie Bossier NP 08/03/17 at 2:30 pm.

## 2017-08-02 NOTE — Telephone Encounter (Signed)
Noted, will evaluate. 

## 2017-08-03 ENCOUNTER — Ambulatory Visit (INDEPENDENT_AMBULATORY_CARE_PROVIDER_SITE_OTHER): Payer: Medicare HMO | Admitting: Primary Care

## 2017-08-03 ENCOUNTER — Telehealth: Payer: Self-pay | Admitting: Primary Care

## 2017-08-03 ENCOUNTER — Encounter: Payer: Self-pay | Admitting: Primary Care

## 2017-08-03 DIAGNOSIS — R519 Headache, unspecified: Secondary | ICD-10-CM

## 2017-08-03 DIAGNOSIS — R51 Headache: Secondary | ICD-10-CM | POA: Diagnosis not present

## 2017-08-03 DIAGNOSIS — I10 Essential (primary) hypertension: Secondary | ICD-10-CM | POA: Diagnosis not present

## 2017-08-03 MED ORDER — AMLODIPINE BESYLATE 10 MG PO TABS: 10.0000 mg | ORAL_TABLET | Freq: Every day | ORAL | 0 refills | Status: DC
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
## 2017-08-03 NOTE — Telephone Encounter (Signed)
Spoken to patient and notified to pick up amlodipine at [facility]

## 2017-08-03 NOTE — Addendum Note (Signed)
Addended by: Jacqualin Combes on: 08/03/2017 04:00 PM   Modules accepted: Orders
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
## 2017-08-03 NOTE — Telephone Encounter (Signed)
Pt stopped at desk to check out and wants to know if she needs to pick up amlodipine at pharmacy. She is requesting a call to discuss. She does not want a vm.

## 2017-08-03 NOTE — Telephone Encounter (Signed)
Noted  

## 2017-08-03 NOTE — Assessment & Plan Note (Signed)
Has been out of her HCTZ 12.5 mg for the past 2-3 months, prefers to switch to another medication as the HCTZ causes urinary frequency. Rx for Amlodipine 10 mg sent to pharmacy. Will have her back in the office in 2-3 weeks for re-evaluation.

## 2017-08-03 NOTE — Patient Instructions (Signed)
Start Amlodipine 10 mg once daily for high blood pressure.  Stop by the front desk and speak with either Rosaria Ferries or Shirlean Mylar regarding your referral to Neurology.  Schedule a follow up visit in 2 weeks to recheck your blood pressure.  It was a pleasure to see you today!

## 2017-08-23 ENCOUNTER — Ambulatory Visit (INDEPENDENT_AMBULATORY_CARE_PROVIDER_SITE_OTHER): Payer: Medicare HMO | Admitting: Primary Care

## 2017-08-23 ENCOUNTER — Encounter: Payer: Self-pay | Admitting: Primary Care

## 2017-08-23 VITALS — BP 120/72 | HR 78 | Temp 98.1°F | Ht 64.0 in | Wt 238.0 lb

## 2017-08-23 DIAGNOSIS — I10 Essential (primary) hypertension: Secondary | ICD-10-CM

## 2017-08-23 MED ORDER — AMLODIPINE BESYLATE 10 MG PO TABS: 10.0000 mg | ORAL_TABLET | Freq: Every day | ORAL | 3 refills | Status: DC

## 2017-08-23 NOTE — Assessment & Plan Note (Signed)
Improved on Amlodipine 10 mg. No headaches which is great news. Continue Amlodipine, refills sent to pharmacy.

## 2017-08-23 NOTE — Patient Instructions (Signed)
Continue Amlodipine 10 mg tablets for high blood pressure.  Please notify me if the headaches return.  Schedule the bone density test and mammogram as discussed.  It was a pleasure to see you today!
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
## 2017-09-22 ENCOUNTER — Ambulatory Visit: Payer: 59 | Admitting: Neurology

## 2017-09-30 ENCOUNTER — Telehealth: Payer: Self-pay

## 2017-09-30 NOTE — Telephone Encounter (Signed)
Pt left v/m; pt has been taking amlodipine and BP is doing good but pt has started loosing hair. Hair is falling out and pt wants to know if this is possible side effect to amlodipine. Pt request cb. Pt last seen 08/23/17.

## 2017-09-30 NOTE — Telephone Encounter (Signed)
Please notify patient that hair loss is not a side effect of Amlodipine.

## 2017-10-03 NOTE — Telephone Encounter (Signed)
Per DPR, left detail message of Kate's comments for patient. 

## 2017-10-17 ENCOUNTER — Other Ambulatory Visit (INDEPENDENT_AMBULATORY_CARE_PROVIDER_SITE_OTHER): Payer: Medicare HMO

## 2017-10-17 DIAGNOSIS — R7303 Prediabetes: Secondary | ICD-10-CM

## 2017-10-17 DIAGNOSIS — E785 Hyperlipidemia, unspecified: Secondary | ICD-10-CM

## 2017-10-17 LAB — LIPID PANEL
Cholesterol: 190 mg/dL (ref 0–200)
HDL: 71.6 mg/dL (ref >39.00)
LDL Cholesterol: 98 mg/dL (ref 0–99)
NonHDL: 118.25
Total CHOL/HDL Ratio: 3
Triglycerides: 100 mg/dL (ref 0.0–149.0)
VLDL: 20 mg/dL (ref 0.0–40.0)

## 2017-10-17 LAB — HEMOGLOBIN A1C: Hgb A1c MFr Bld: 5.7 % (ref 4.6–6.5)

## 2018-02-24 ENCOUNTER — Telehealth: Payer: Self-pay | Admitting: *Deleted
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
## 2018-02-27 ENCOUNTER — Encounter: Payer: Self-pay | Admitting: Primary Care

## 2018-02-27 NOTE — Telephone Encounter (Signed)
Please notify patient that her letter is ready for pick up. Placed in [place].

## 2018-02-28 NOTE — Telephone Encounter (Signed)
Attempted to contact pt; vm full and unable to leave message. Letter available for pickup at the front desk

## 2018-03-01 NOTE — Telephone Encounter (Signed)
Spoke to patient and notified her that letter has been left in the front office for pick up. Patient verbalized understanding.

## 2018-04-10 ENCOUNTER — Encounter: Payer: Self-pay | Admitting: Emergency Medicine

## 2018-04-10 ENCOUNTER — Emergency Department: Payer: Medicare HMO

## 2018-04-10 ENCOUNTER — Emergency Department
Admission: EM | Admit: 2018-04-10 | Discharge: 2018-04-10 | Disposition: A | Discharge status: Home / Self Care | Payer: Medicare HMO | Attending: Emergency Medicine | Admitting: Emergency Medicine

## 2018-04-10 ENCOUNTER — Other Ambulatory Visit: Payer: Self-pay

## 2018-04-10 DIAGNOSIS — I10 Essential (primary) hypertension: Secondary | ICD-10-CM | POA: Insufficient documentation

## 2018-04-10 DIAGNOSIS — Z79899 Other long term (current) drug therapy: Secondary | ICD-10-CM | POA: Insufficient documentation

## 2018-04-10 DIAGNOSIS — R1110 Vomiting, unspecified: Secondary | ICD-10-CM | POA: Diagnosis not present

## 2018-04-10 DIAGNOSIS — R109 Unspecified abdominal pain: Secondary | ICD-10-CM | POA: Diagnosis not present

## 2018-04-10 DIAGNOSIS — K8020 Calculus of gallbladder without cholecystitis without obstruction: Secondary | ICD-10-CM | POA: Diagnosis not present

## 2018-04-10 DIAGNOSIS — R1011 Right upper quadrant pain: Secondary | ICD-10-CM | POA: Diagnosis not present

## 2018-04-10 DIAGNOSIS — R531 Weakness: Secondary | ICD-10-CM | POA: Diagnosis not present

## 2018-04-10 LAB — URINALYSIS, COMPLETE (UACMP) WITH MICROSCOPIC
Bacteria, UA: NONE SEEN
Bilirubin Urine: NEGATIVE
Glucose, UA: NEGATIVE mg/dL
Ketones, ur: NEGATIVE mg/dL
Nitrite: NEGATIVE
Protein, ur: NEGATIVE mg/dL
Specific Gravity, Urine: 1.021 (ref 1.005–1.030)
pH: 5 (ref 5.0–8.0)

## 2018-04-10 LAB — CBC
HCT: 35.4 % (ref 35.0–47.0)
Hemoglobin: 11.7 g/dL — ABNORMAL LOW (ref 12.0–16.0)
MCH: 28.7 pg (ref 26.0–34.0)
MCHC: 33 g/dL (ref 32.0–36.0)
MCV: 86.9 fL (ref 80.0–100.0)
Platelets: 237 10*3/uL (ref 150–440)
RBC: 4.08 MIL/uL (ref 3.80–5.20)
RDW: 13.8 % (ref 11.5–14.5)
WBC: 8.7 10*3/uL (ref 3.6–11.0)

## 2018-04-10 LAB — COMPREHENSIVE METABOLIC PANEL
ALT: 17 U/L (ref 14–54)
AST: 21 U/L (ref 15–41)
Albumin: 3.7 g/dL (ref 3.5–5.0)
Alkaline Phosphatase: 63 U/L (ref 38–126)
Anion gap: 8 (ref 5–15)
BUN: 22 mg/dL — ABNORMAL HIGH (ref 6–20)
CO2: 23 mmol/L (ref 22–32)
Calcium: 8.9 mg/dL (ref 8.9–10.3)
Chloride: 108 mmol/L (ref 101–111)
Creatinine, Ser: 1.01 mg/dL — ABNORMAL HIGH (ref 0.44–1.00)
GFR calc Af Amer: >60 mL/min (ref >60)
GFR calc non Af Amer: 56 mL/min — ABNORMAL LOW (ref >60)
Glucose, Bld: 148 mg/dL — ABNORMAL HIGH (ref 65–99)
Potassium: 3.4 mmol/L — ABNORMAL LOW (ref 3.5–5.1)
Sodium: 139 mmol/L (ref 135–145)
Total Bilirubin: 0.5 mg/dL (ref 0.3–1.2)
Total Protein: 7 g/dL (ref 6.5–8.1)

## 2018-04-10 LAB — TROPONIN I: Troponin I: <0.03 ng/mL (ref <0.03)

## 2018-04-10 LAB — LIPASE, BLOOD: Lipase: 23 U/L (ref 11–51)

## 2018-04-10 MED ORDER — SODIUM CHLORIDE 0.9 % IV BOLUS
1000.0000 mL | Freq: Once | INTRAVENOUS | Status: AC
Start: 2018-04-10 — End: 2018-04-10
  Administered 2018-04-10: 1000 mL via INTRAVENOUS

## 2018-04-10 MED ORDER — IOPAMIDOL (ISOVUE-370) INJECTION 76%
75.0000 mL | Freq: Once | INTRAVENOUS | Status: AC | PRN
  Administered 2018-04-10: 75 mL via INTRAVENOUS

## 2018-04-10 MED ORDER — FENTANYL CITRATE (PF) 100 MCG/2ML IJ SOLN
100.0000 ug | Freq: Once | INTRAMUSCULAR | Status: AC
  Administered 2018-04-10: 100 ug via INTRAVENOUS
  Filled 2018-04-10: qty 2

## 2018-04-10 MED ORDER — OXYCODONE-ACETAMINOPHEN 5-325 MG PO TABS: 1.0000 | ORAL_TABLET | ORAL | 0 refills | Status: DC | PRN

## 2018-04-10 MED ORDER — ONDANSETRON 4 MG PO TBDP: 4.0000 mg | ORAL_TABLET | Freq: Three times a day (TID) | ORAL | 0 refills | Status: DC | PRN

## 2018-04-10 MED ORDER — OXYCODONE HCL 5 MG PO TABS
5.0000 mg | ORAL_TABLET | Freq: Once | ORAL | Status: AC
  Administered 2018-04-10: 5 mg via ORAL
  Filled 2018-04-10: qty 1

## 2018-04-10 MED ORDER — ONDANSETRON HCL 4 MG/2ML IJ SOLN
4.0000 mg | Freq: Once | INTRAMUSCULAR | Status: AC
  Administered 2018-04-10: 4 mg via INTRAVENOUS
  Filled 2018-04-10: qty 2

## 2018-04-10 MED ORDER — ONDANSETRON 4 MG PO TBDP
4.0000 mg | ORAL_TABLET | Freq: Once | ORAL | Status: AC | PRN
  Administered 2018-04-10: 4 mg via ORAL
  Filled 2018-04-10: qty 1

## 2018-04-10 MED ORDER — ACETAMINOPHEN 500 MG PO TABS
1000.0000 mg | ORAL_TABLET | Freq: Once | ORAL | Status: AC
Start: 2018-04-10 — End: 2018-04-10
  Administered 2018-04-10: 1000 mg via ORAL
  Filled 2018-04-10: qty 2

## 2018-04-10 NOTE — ED Notes (Signed)
Pt states "whatever y'all are putting in this IV is working wonders" referring to her IV fluids. Pt comfortable at this time and states she is feeling much better.
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
## 2018-04-10 NOTE — ED Provider Notes (Signed)
[facility] Emergency Department Provider Note  ____________________________________________  Time seen: Approximately 8:40 AM  I have reviewed the triage vital signs and the nursing notes.   HISTORY  Chief Complaint Weakness and Emesis   HPI Ann Barker is a 67 y.o. female with a history of hypertension, cholelithiasis, and obesity who presents for evaluation of abdominal pain.  Patient reports that she woke up this morning with diffuse cramping abdominal pain 10/10 associated with several episodes of nonbloody nonbilious emesis.  No fever chills but she does endorse body aches, no diarrhea, no cough, congestion, or URI symptoms.  Patient has had hysterectomy and tubal ligation. NO other abdominal surgeries.   Past Medical History:  Diagnosis Date  . Acid reflux   . Anemia   . Arthritis   . Dizziness   . Gallstones   . Hematuria    microscopic  . HLD (hyperlipidemia)   . Hoarseness   . Hypertension   . Lower extremity edema   . Over weight   . Renal cyst   . Urge incontinence   . UTI (lower urinary tract infection)   . Vitamin D deficiency     Patient Active Problem List   Diagnosis Date Noted  . Hyperlipidemia 04/14/2017  . Prediabetes 04/14/2017  . Lymphedema 09/15/2016  . Chronic venous insufficiency 09/15/2016  . Frequent headaches 03/06/2016  . Bilateral renal cysts 09/16/2015  . Essential hypertension 09/09/2015  . Anemia 09/09/2015  . History of hematuria 09/09/2015    Past Surgical History:  Procedure Laterality Date  . ABDOMINAL HYSTERECTOMY  1981  . BREAST BIOPSY  1995/2005  . HERNIA REPAIR  2005  . TUBAL LIGATION  1975    Prior to Admission medications   Medication Sig Start Date End Date Taking? Authorizing Provider  amLODipine (NORVASC) 10 MG tablet Take 1 tablet (10 mg total) by mouth daily. 08/23/17   Pleas Koch, NP  BIOTIN PO Take 1 capsule by mouth daily.    [provider]  CALCIUM PO Take  by mouth.    [provider]  cholecalciferol (VITAMIN D) 1000 units tablet Take 1,000 Units by mouth daily.    [provider]  ibuprofen (MOTRIN IB) 200 MG tablet Take 2 tablets (400 mg total) by mouth every 8 (eight) hours as needed (for pain, with food.). 07/23/16   Tonia Ghent, MD  Multiple Vitamin (MULTIVITAMIN) capsule Take 1 capsule by mouth daily.    [provider]  mupirocin ointment (BACTROBAN) 2 % Apply 1 application topically 2 (two) times daily. 01/18/17   Edrick Kins, DPM  ondansetron (ZOFRAN ODT) 4 MG disintegrating tablet Take 1 tablet (4 mg total) by mouth every 8 (eight) hours as needed for nausea or vomiting. 04/10/18   Alfred Levins, [place], MD  oxyCODONE-acetaminophen (PERCOCET) 5-325 MG tablet Take 1 tablet by mouth every 4 (four) hours as needed for severe pain. 04/10/18   Rudene Re, MD    Allergies Morphine; Clindamycin/lincomycin; Methylprednisolone; Codeine; Morphine and related; and Penicillins  Family History  Problem Relation Age of Onset  . Cancer Paternal Aunt   . Stroke Paternal Uncle   . Prostate cancer Neg Hx   . Kidney disease Neg Hx   . Bladder Cancer Neg Hx     Social History Social History   Tobacco Use  . Smoking status: Never Smoker  . Smokeless tobacco: Never Used  Substance Use Topics  . Alcohol use: No    Alcohol/week: 0.0 oz  .  Drug use: No    Review of Systems  Constitutional: Negative for fever. Eyes: Negative for visual changes. ENT: Negative for sore throat. Neck: No neck pain  Cardiovascular: Negative for chest pain. Respiratory: Negative for shortness of breath. Gastrointestinal: +abdominal pain, nausea, and vomiting. No diarrhea. Genitourinary: Negative for dysuria. Musculoskeletal: Negative for back pain. Skin: Negative for rash. Neurological: Negative for headaches, weakness or numbness. Psych: No SI or HI  ____________________________________________   PHYSICAL EXAM:  VITAL  SIGNS: ED Triage Vitals  Enc Vitals Group     BP 04/10/18 0547 100/62     Pulse Rate 04/10/18 0547 76     Resp 04/10/18 0547 18     Temp 04/10/18 0547 97.9 F (36.6 C)     Temp Source 04/10/18 0547 Oral     SpO2 04/10/18 0547 97 %     Weight 04/10/18 0550 238 lb (108 kg)     Height 04/10/18 0550 5\' 4"  (1.626 m)     Head Circumference --      Peak Flow --      Pain Score 04/10/18 0550 10     Pain Loc --      Pain Edu? --      Excl. in [place]? --     Constitutional: Alert and oriented, moaning, in pain.  HEENT:      Head: Normocephalic and atraumatic.         Eyes: Conjunctivae are normal. Sclera is non-icteric.       Mouth/Throat: Mucous membranes are moist.       Neck: Supple with no signs of meningismus. Cardiovascular: Regular rate and rhythm. No murmurs, gallops, or rubs. 2+ symmetrical distal pulses are present in all extremities. No JVD. Respiratory: Normal respiratory effort. Lungs are clear to auscultation bilaterally. No wheezes, crackles, or rhonchi.  Gastrointestinal: Obese, diffusely tender to palpation with no rebound or guarding, positive bowel sounds, negative Murphy's sign Musculoskeletal: Nontender with normal range of motion in all extremities. No edema, cyanosis, or erythema of extremities. Neurologic: Normal speech and language. Face is symmetric. Moving all extremities. No gross focal neurologic deficits are appreciated. Skin: Skin is warm, dry and intact. No rash noted. Psychiatric: Mood and affect are normal. Speech and behavior are normal.  ____________________________________________   LABS (all labs ordered are listed, but only abnormal results are displayed)  Labs Reviewed  COMPREHENSIVE METABOLIC PANEL - Abnormal; Notable for the following components:      Result Value   Potassium 3.4 (*)    Glucose, Bld 148 (*)    BUN 22 (*)    Creatinine, Ser 1.01 (*)    GFR calc non Af Amer 56 (*)    All other components within normal limits  CBC - Abnormal;  Notable for the following components:   Hemoglobin 11.7 (*)    All other components within normal limits  URINALYSIS, COMPLETE (UACMP) WITH MICROSCOPIC - Abnormal; Notable for the following components:   Color, Urine YELLOW (*)    APPearance HAZY (*)    Hgb urine dipstick LARGE (*)    Leukocytes, UA SMALL (*)    All other components within normal limits  URINE CULTURE  LIPASE, BLOOD  TROPONIN I   ____________________________________________  EKG  ED ECG REPORT I, Rudene Re, the attending physician, personally viewed and interpreted this ECG.  Sinus bradycardia, rate of 59, normal intervals, normal axis, no ST elevations or depressions, T wave inversion in lead III.  Unchanged from prior from 2017 ____________________________________________  RADIOLOGY  I have personally reviewed the images performed during this visit and I agree with the Radiologist's read.   Interpretation by Radiologist:  Ct Abdomen Pelvis W Contrast  Result Date: 04/10/2018 CLINICAL DATA:  Abdominal pain and vomiting. Normal white blood cell count EXAM: CT ABDOMEN AND PELVIS WITH CONTRAST TECHNIQUE: Multidetector CT imaging of the abdomen and pelvis was performed using the standard protocol following bolus administration of intravenous contrast. CONTRAST:  30mL ISOVUE-370 IOPAMIDOL (ISOVUE-370) INJECTION 76% COMPARISON:  December 10, 2014 FINDINGS: Lower chest: There is mild bibasilar atelectasis. There is no airspace consolidation in the lung bases. There is a small hiatal hernia. Hepatobiliary: There is a cyst in the anterior segment right lobe of the liver near the dome measuring 2.4 x 1.9 cm, a finding also present on prior study. No other focal liver lesions are apparent. The gallbladder is mildly distended without gallbladder wall thickening or pericholecystic fluid. There is no evident biliary duct dilatation. Pancreas: There is no pancreatic mass or inflammatory focus. Spleen: No splenic lesions are  evident. Adrenals/Urinary Tract: Adrenals bilaterally appear normal. There are scattered subcentimeter cysts throughout each kidney. There is no evident hydronephrosis on either side. There is no evident renal or ureteral calculus on either side. An ovarian vein phlebolith is seen immediately adjacent to the right ureter at the level of L4-5 but is separate from the ureter, best appreciated on coronal images. Urinary bladder is midline with wall thickness within normal limits. Stomach/Bowel: There are multiple sigmoid diverticula without diverticulitis. There is no appreciable bowel wall or mesenteric thickening. There is no appreciable bowel obstruction. There is no free air or portal venous air. Vascular/Lymphatic: There are scattered foci of atherosclerotic calcification in the aorta and common iliac arteries. No evident aneurysm. Major mesenteric arterial vessels appear patent. There is no appreciable adenopathy in the abdomen or pelvis by size criteria. There are occasional subcentimeter mesenteric lymph nodes, primarily on the right side, regarded as nonspecific. Reproductive: Uterus is absent.  No evident pelvic mass. Other: Appendix appears unremarkable. No evident abscess or ascites in the abdomen or pelvis. There is evidence previous ventral hernia repair. Musculoskeletal: There is degenerative change in the lower thoracic spine. There are no blastic or lytic bone lesions. There is an apparent bone island in the right iliac bone. There is a hemangioma in the T11 vertebral body. There is no intramuscular or abdominal wall lesion. IMPRESSION: 1. Gallbladder mildly distended without overt cholecystitis change by CT. This appearance may well warrant ultrasound of the gallbladder to further evaluate given patient's symptoms. 2. Sigmoid diverticulosis without diverticulitis. No bowel obstruction. No abscess. Appendix appears normal. 3. Subcentimeter mesenteric lymph nodes, regarded as nonspecific. In the  appropriate clinical setting, these lymph nodes potentially could represent a degree of mesenteric adenitis. 4.  No evident renal or ureteral calculus.  No hydronephrosis. 5.  Small hiatal hernia.  Status post ventral hernia repair. 6.  Aortoiliac atherosclerosis. 7.  Uterus absent. Aortic Atherosclerosis (ICD10-I70.0). Electronically Signed   By: Lowella Grip III M.D.   On: 04/10/2018 10:19   US Abdomen Limited Ruq  Result Date: 04/10/2018 CLINICAL DATA:  Right upper quadrant abdominal pain. EXAM: ULTRASOUND ABDOMEN LIMITED RIGHT UPPER QUADRANT COMPARISON:  CT scan dated 04/10/2018 FINDINGS: Gallbladder: There is a 2.7 cm non mobile stone in the neck of the gallbladder. There is also a small amount of sludge in the gallbladder. No gallbladder wall thickening. Negative sonographic Murphy's sign. Common bile duct: Diameter: 5.9 mm, normal. Liver: 2.9 cm benign appearing  cyst in the dome of the right lobe of the liver. Within normal limits in parenchymal echogenicity. Portal vein is patent on color Doppler imaging with normal direction of blood flow towards the liver. IMPRESSION: 2.7 cm non mobile stone in the neck of the gallbladder. No other significant abnormality. Electronically Signed   By: Lorriane Shire M.D.   On: 04/10/2018 11:23      ____________________________________________   PROCEDURES  Procedure(s) performed: None Procedures Critical Care performed:  None ____________________________________________   INITIAL IMPRESSION / ASSESSMENT AND PLAN / ED COURSE  67 y.o. female with a history of hypertension, cholelithiasis, and obesity who presents for evaluation of diffuse abdominal pain, nausea and vomiting since this morning.  Patient looks uncomfortable due to pain, her vitals are within normal limits, her abdomen is obese with diffuse tenderness, no rebound or guarding, positive bowel sounds, negative Murphy sign.  Differential diagnosis including cholecystitis versus pancreatitis  versus small bowel obstruction versus diverticulitis versus appendicitis versus gastroenteritis versus kidney stone versus pyelonephritis.  Plan for labs and a CT abdomen pelvis.  Will treat symptoms with fentanyl, fluids, and Zofran    _________________________ 2:24 PM on 04/10/2018 -----------------------------------------  Evaluation consistent with cholelithiasis with no evidence of cholecystitis.  No other acute findings.  Patient's pain has significantly improved.  She is complaining of some soreness in her epigastric region but she is no longer tender to palpation.  Discussed with Dr. Burt Knack, surgeon on-call who recommended p.o. challenge and discharged home on pain medication for outpatient management.  Her labs are all within normal limits with normal white count, normal LFTs, normal T bili, normal lipase.  Patient is comfortable with this plan.  She will be discharged home with a small prescription of Percocet and Zofran.  Discussed strict return precautions with her.   As part of my medical decision making, I reviewed the following data within the [facility] notes reviewed and incorporated, Labs reviewed , EKG interpreted , Radiograph reviewed , A consult was requested and obtained from this/these consultant(s) Surgery, Notes from prior ED visits and [place] Controlled Substance Database    Pertinent labs & imaging results that were available during my care of the patient were reviewed by me and considered in my medical decision making (see chart for details).    ____________________________________________   FINAL CLINICAL IMPRESSION(S) / ED DIAGNOSES  Final diagnoses:  RUQ abdominal pain  Calculus of gallbladder without cholecystitis without obstruction      NEW MEDICATIONS STARTED DURING THIS VISIT:  ED Discharge Orders        Ordered    oxyCODONE-acetaminophen (PERCOCET) 5-325 MG tablet  Every 4 hours PRN     04/10/18 1424    ondansetron (ZOFRAN  ODT) 4 MG disintegrating tablet  Every 8 hours PRN     04/10/18 1424       Note:  This document was prepared using Dragon voice recognition software and may include unintentional dictation errors.    Alfred Levins, [place], MD 04/10/18 [id number]

## 2018-04-10 NOTE — ED Notes (Signed)
This RN discussed discharge with patient.  Patient reports she drove herself and does not have a ride until 5pm.  Patient is in no obvious distress.

## 2018-04-11 ENCOUNTER — Ambulatory Visit: Payer: Medicare HMO

## 2018-04-11 LAB — URINE CULTURE

## 2018-04-14 ENCOUNTER — Encounter: Payer: Self-pay | Admitting: Primary Care

## 2018-04-14 ENCOUNTER — Ambulatory Visit (INDEPENDENT_AMBULATORY_CARE_PROVIDER_SITE_OTHER): Payer: Medicare HMO | Admitting: Primary Care

## 2018-04-14 VITALS — BP 122/70 | HR 100 | Temp 98.2°F | Ht 64.0 in | Wt 240.5 lb

## 2018-04-14 DIAGNOSIS — K8020 Calculus of gallbladder without cholecystitis without obstruction: Secondary | ICD-10-CM

## 2018-04-14 DIAGNOSIS — R1011 Right upper quadrant pain: Secondary | ICD-10-CM | POA: Diagnosis not present

## 2018-04-14 HISTORY — DX: Calculus of gallbladder without cholecystitis without obstruction: K80.20

## 2018-04-14 LAB — COMPREHENSIVE METABOLIC PANEL
ALT: 12 U/L (ref 0–35)
AST: 15 U/L (ref 0–37)
Albumin: 3.6 g/dL (ref 3.5–5.2)
Alkaline Phosphatase: 69 U/L (ref 39–117)
BUN: 17 mg/dL (ref 6–23)
CO2: 29 mEq/L (ref 19–32)
Calcium: 8.9 mg/dL (ref 8.4–10.5)
Chloride: 110 mEq/L (ref 96–112)
Creatinine, Ser: 0.92 mg/dL (ref 0.40–1.20)
GFR: 77.9 mL/min (ref >60.00)
Glucose, Bld: 97 mg/dL (ref 70–99)
Potassium: 3.6 mEq/L (ref 3.5–5.1)
Sodium: 145 mEq/L (ref 135–145)
Total Bilirubin: 0.5 mg/dL (ref 0.2–1.2)
Total Protein: 6.7 g/dL (ref 6.0–8.3)

## 2018-04-14 LAB — CBC WITH DIFFERENTIAL/PLATELET
Basophils Absolute: 0.1 10*3/uL (ref 0.0–0.1)
Basophils Relative: 1.1 % (ref 0.0–3.0)
Eosinophils Absolute: 0.2 10*3/uL (ref 0.0–0.7)
Eosinophils Relative: 2.8 % (ref 0.0–5.0)
HCT: 35.8 % — ABNORMAL LOW (ref 36.0–46.0)
Hemoglobin: 11.8 g/dL — ABNORMAL LOW (ref 12.0–15.0)
Lymphocytes Relative: 27.6 % (ref 12.0–46.0)
Lymphs Abs: 1.8 10*3/uL (ref 0.7–4.0)
MCHC: 33 g/dL (ref 30.0–36.0)
MCV: 87 fl (ref 78.0–100.0)
Monocytes Absolute: 0.6 10*3/uL (ref 0.1–1.0)
Monocytes Relative: 9 % (ref 3.0–12.0)
Neutro Abs: 3.8 10*3/uL (ref 1.4–7.7)
Neutrophils Relative %: 59.5 % (ref 43.0–77.0)
Platelets: 248 10*3/uL (ref 150.0–400.0)
RBC: 4.11 Mil/uL (ref 3.87–5.11)
RDW: 13.8 % (ref 11.5–15.5)
WBC: 6.3 10*3/uL (ref 4.0–10.5)

## 2018-04-14 NOTE — Assessment & Plan Note (Signed)
Noted during ED visit o 04/10/18. Exam today with generalized tenderness, does appear uncomfortable but stable.  Check CBC and CMP today to ensure no changes since evaluation earlier this week.  Was able to get her appointment with the surgeon moved up to Monday next week. Strict hospital precautions provided for over the weekend.

## 2018-04-14 NOTE — Progress Notes (Signed)
Subjective:    Patient ID: Ann Barker Roles, female    DOB: [birth year], 67 y.o.   MRN: [id number]  HPI  Ann Barker is a 67 year old female who presents today for emergency department follow up.  She presented to [facility] ED on 04/10/18 with a chief complaint of abdominal pain. She also reported vomiting and weakness.   During her stay in the ED she underwent CT abdomen/pelvis which showed cholelithiasis without cholecystitis. [place] of abdomen showed 2.7 cm non mobile stone in the neck of the gallbladder. She was treated with IV pain and anti-nausea medication as well as fluids with improvement. Labs with normal liver enzymes, bilirubin, and white blood cell counts. She was discharged home with a prescription for Percocet and Zofran.   Since her ED visit she's continued to experience abdominal pain. Her pain is located to the RUQ and LLQ abdomen with meals only. She doesn't experience pain in between meals. She denies vomiting, diarrhea, bloody stools, fevers. She's been eating soup, sandwiches since her last visit. She's been taking her Percocet, taking 1/2 tablet at a time. She's not had to take her Zofran. Overall she's feeling slightly better, no worse. She has an appointment scheduled with the surgeon on 05/08 but doesn't feel as though she can wait for her appointment next Wednesday.    Review of Systems  Constitutional: Negative for fever.  Gastrointestinal: Positive for abdominal pain and nausea. Negative for blood in stool, constipation, diarrhea and vomiting.       Past Medical History:  Diagnosis Date  . Acid reflux   . Anemia   . Arthritis   . Dizziness   . Gallstones   . Hematuria    microscopic  . HLD (hyperlipidemia)   . Hoarseness   . Hypertension   . Lower extremity edema   . Over weight   . Renal cyst   . Urge incontinence   . UTI (lower urinary tract infection)   . Vitamin D deficiency      Social History   Socioeconomic History  . Marital status: Married   Spouse name: Not on file  . Number of children: Not on file  . Years of education: Not on file  . Highest education level: Not on file  Occupational History  . Not on file  Social Needs  . Financial resource strain: Not on file  . Food insecurity:    Worry: Not on file    Inability: Not on file  . Transportation needs:    Medical: Not on file    Non-medical: Not on file  Tobacco Use  . Smoking status: Never Smoker  . Smokeless tobacco: Never Used  Substance and Sexual Activity  . Alcohol use: No    Alcohol/week: 0.0 oz  . Drug use: No  . Sexual activity: Not on file  Lifestyle  . Physical activity:    Days per week: Not on file    Minutes per session: Not on file  . Stress: Not on file  Relationships  . Social connections:    Talks on phone: Not on file    Gets together: Not on file    Attends religious service: Not on file    Active member of club or organization: Not on file    Attends meetings of clubs or organizations: Not on file    Relationship status: Not on file  . Intimate partner violence:    Fear of current or ex partner: Not on file  Emotionally abused: Not on file    Physically abused: Not on file    Forced sexual activity: Not on file  Other Topics Concern  . Not on file  Social History Narrative  . Not on file    Past Surgical History:  Procedure Laterality Date  . ABDOMINAL HYSTERECTOMY  1981  . BREAST BIOPSY  1995/2005  . HERNIA REPAIR  2005  . TUBAL LIGATION  1975    Family History  Problem Relation Age of Onset  . Cancer Paternal Aunt   . Stroke Paternal Uncle   . Prostate cancer Neg Hx   . Kidney disease Neg Hx   . Bladder Cancer Neg Hx     Allergies  Allergen Reactions  . Morphine Other (See Comments)    Unknown  . Clindamycin/Lincomycin Diarrhea  . Methylprednisolone Swelling  . Codeine Palpitations and Other (See Comments)    unknown  . Morphine And Related Palpitations  . Penicillins Rash and Other (See Comments)     unknown    Current Outpatient Medications on File Prior to Visit  Medication Sig Dispense Refill  . amLODipine (NORVASC) 10 MG tablet Take 1 tablet (10 mg total) by mouth daily. 90 tablet 3  . BIOTIN PO Take 1 capsule by mouth daily.    Marland Kitchen CALCIUM PO Take by mouth.    . cholecalciferol (VITAMIN D) 1000 units tablet Take 1,000 Units by mouth daily.    Marland Kitchen ibuprofen (MOTRIN IB) 200 MG tablet Take 2 tablets (400 mg total) by mouth every 8 (eight) hours as needed (for pain, with food.).    Marland Kitchen Multiple Vitamin (MULTIVITAMIN) capsule Take 1 capsule by mouth daily.    . mupirocin ointment (BACTROBAN) 2 % Apply 1 application topically 2 (two) times daily. 22 g 0  . ondansetron (ZOFRAN ODT) 4 MG disintegrating tablet Take 1 tablet (4 mg total) by mouth every 8 (eight) hours as needed for nausea or vomiting. 20 tablet 0  . oxyCODONE-acetaminophen (PERCOCET) 5-325 MG tablet Take 1 tablet by mouth every 4 (four) hours as needed for severe pain. 8 tablet 0   No current facility-administered medications on file prior to visit.     BP 122/70   Pulse 100   Temp 98.2 F (36.8 C) (Oral)   Ht 5\' 4"  (1.626 m)   Wt 240 lb 8 oz (109.1 kg)   SpO2 98%   BMI 41.28 kg/m    Objective:   Physical Exam  Constitutional: She appears well-nourished. She does not appear ill.  Cardiovascular: Normal rate and regular rhythm.  Pulmonary/Chest: Effort normal and breath sounds normal.  Abdominal: Soft. Bowel sounds are normal. There is generalized tenderness.  Skin: Skin is warm and dry.          Assessment & Plan:

## 2018-04-14 NOTE — Patient Instructions (Signed)
Stop by the lab prior to leaving today. I will notify you of your results once received.   Make sure to eat a low fat diet, take a look at the information below.   Please go to the hospital if you get worse over the weekend.  Meet with Anastasiya up front for your general surgery appointment.  It was a pleasure to see you today!   Low-Fat Diet for Pancreatitis or Gallbladder Conditions A low-fat diet can be helpful if you have pancreatitis or a gallbladder condition. With these conditions, your pancreas and gallbladder have trouble digesting fats. A healthy eating plan with less fat will help rest your pancreas and gallbladder and reduce your symptoms. What do I need to know about this diet?  Eat a low-fat diet. ? Reduce your fat intake to less than 20-30% of your total daily calories. This is less than 50-60 g of fat per day. ? Remember that you need some fat in your diet. Ask your dietician what your daily goal should be. ? Choose nonfat and low-fat healthy foods. Look for the words "nonfat," "low fat," or "fat free." ? As a guide, look on the label and choose foods with less than 3 g of fat per serving. Eat only one serving.  Avoid alcohol.  Do not smoke. If you need help quitting, talk with your health care provider.  Eat small frequent meals instead of three large heavy meals. What foods can I eat? Grains Include healthy grains and starches such as potatoes, wheat bread, fiber-rich cereal, and brown rice. Choose whole grain options whenever possible. In adults, whole grains should account for 45-65% of your daily calories. Fruits and Vegetables Eat plenty of fruits and vegetables. Fresh fruits and vegetables add fiber to your diet. Meats and Other Protein Sources Eat lean meat such as chicken and pork. Trim any fat off of meat before cooking it. Eggs, fish, and beans are other sources of protein. In adults, these foods should account for 10-35% of your daily  calories. Dairy Choose low-fat milk and dairy options. Dairy includes fat and protein, as well as calcium. Fats and Oils Limit high-fat foods such as fried foods, sweets, baked goods, sugary drinks. Other Creamy sauces and condiments, such as mayonnaise, can add extra fat. Think about whether or not you need to use them, or use smaller amounts or low fat options. What foods are not recommended?  High fat foods, such as: ? [employer]. ? Ice cream. ? [place] toast. ? Sweet rolls. ? Pizza. ? Cheese bread. ? Foods covered with batter, butter, creamy sauces, or cheese. ? Fried foods. ? Sugary drinks and desserts.  Foods that cause gas or bloating This information is not intended to replace advice given to you by your health care provider. Make sure you discuss any questions you have with your health care provider. Document Released: 12/04/2013 Document Revised: 05/06/2016 Document Reviewed: 11/12/2013 Elsevier Interactive Patient Education  2017 [employer].

## 2018-04-17 ENCOUNTER — Encounter: Payer: Self-pay | Admitting: Surgery

## 2018-04-17 ENCOUNTER — Ambulatory Visit (INDEPENDENT_AMBULATORY_CARE_PROVIDER_SITE_OTHER): Payer: 59 | Admitting: Surgery

## 2018-04-17 VITALS — BP 149/95 | HR 94 | Temp 98.0°F | Ht 64.0 in | Wt 239.6 lb

## 2018-04-17 DIAGNOSIS — K8020 Calculus of gallbladder without cholecystitis without obstruction: Secondary | ICD-10-CM

## 2018-04-17 MED ORDER — OMEPRAZOLE 40 MG PO CPDR: 40.0000 mg | DELAYED_RELEASE_CAPSULE | Freq: Every day | ORAL | 1 refills | Status: DC

## 2018-04-17 NOTE — Patient Instructions (Addendum)
You have requested to have your gallbladder removed. This will be done the week of May 21-2  at [facility] with Dr. Tama High. We will call you with an exact date within 24 hours from today.  You will most likely be out of work 1-2 weeks for this surgery. You will return after your post-op appointment with a lifting restriction for approximately 4 more weeks.  You will be able to eat anything you would like to following surgery. But, start by eating a bland diet and advance this as tolerated. The Gallbladder diet is below, please go as closely by this diet as possible prior to surgery to avoid any further attacks.  Please see the (blue)pre-care form that you have been given today. If you have any questions, please call our office.  Laparoscopic Cholecystectomy Laparoscopic cholecystectomy is surgery to remove the gallbladder. The gallbladder is located in the upper right part of the abdomen, behind the liver. It is a storage sac for bile, which is produced in the liver. Bile aids in the digestion and absorption of fats. Cholecystectomy is often done for inflammation of the gallbladder (cholecystitis). This condition is usually caused by a buildup of gallstones (cholelithiasis) in the gallbladder. Gallstones can block the flow of bile, and that can result in inflammation and pain. In severe cases, emergency surgery may be required. If emergency surgery is not required, you will have time to prepare for the procedure. Laparoscopic surgery is an alternative to open surgery. Laparoscopic surgery has a shorter recovery time. Your common bile duct may also need to be examined during the procedure. If stones are found in the common bile duct, they may be removed. LET [facility] CARE PROVIDER KNOW ABOUT:  Any allergies you have.  All medicines you are taking, including vitamins, herbs, eye drops, creams, and over-the-counter medicines.  Previous problems you or members of your family have had with  the use of anesthetics.  Any blood disorders you have.  Previous surgeries you have had.    Any medical conditions you have. RISKS AND COMPLICATIONS Generally, this is a safe procedure. However, problems may occur, including:  Infection.  Bleeding.  Allergic reactions to medicines.  Damage to other structures or organs.  A stone remaining in the common bile duct.  A bile leak from the cyst duct that is clipped when your gallbladder is removed.  The need to convert to open surgery, which requires a larger incision in the abdomen. This may be necessary if your surgeon thinks that it is not safe to continue with a laparoscopic procedure. BEFORE THE PROCEDURE  Ask your health care provider about:  Changing or stopping your regular medicines. This is especially important if you are taking diabetes medicines or blood thinners.  Taking medicines such as aspirin and ibuprofen. These medicines can thin your blood. Do not take these medicines before your procedure if your health care provider instructs you not to.  Follow instructions from your health care provider about eating or drinking restrictions.  Let your health care provider know if you develop a cold or an infection before surgery.  Plan to have someone take you home after the procedure.  Ask your health care provider how your surgical site will be marked or identified.  You may be given antibiotic medicine to help prevent infection. PROCEDURE  To reduce your risk of infection:  Your health care team will wash or sanitize their hands.  Your skin will be washed with soap.  An IV  tube may be inserted into one of your veins.  You will be given a medicine to make you fall asleep (general anesthetic).  A breathing tube will be placed in your mouth.  The surgeon will make several small cuts (incisions) in your abdomen.  A thin, lighted tube (laparoscope) that has a tiny camera on the end will be inserted through  one of the small incisions. The camera on the laparoscope will send a picture to a TV screen (monitor) in the operating room. This will give the surgeon a good view inside your abdomen.  A gas will be pumped into your abdomen. This will expand your abdomen to give the surgeon more room to perform the surgery.  Other tools that are needed for the procedure will be inserted through the other incisions. The gallbladder will be removed through one of the incisions.  After your gallbladder has been removed, the incisions will be closed with stitches (sutures), staples, or skin glue.  Your incisions may be covered with a bandage (dressing). The procedure may vary among health care providers and hospitals. AFTER THE PROCEDURE  Your blood pressure, heart rate, breathing rate, and blood oxygen level will be monitored often until the medicines you were given have worn off.  You will be given medicines as needed to control your pain.   This information is not intended to replace advice given to you by your health care provider. Make sure you discuss any questions you have with your health care provider.   Document Released: 11/29/2005 Document Revised: 08/20/2015 Document Reviewed: 07/11/2013 Elsevier Interactive Patient Education 2016 [place] Diet for Gallbladder Conditions A low-fat diet can be helpful if you have pancreatitis or a gallbladder condition. With these conditions, your pancreas and gallbladder have trouble digesting fats. A healthy eating plan with less fat will help rest your pancreas and gallbladder and reduce your symptoms. WHAT DO I NEED TO KNOW ABOUT THIS DIET?  Eat a low-fat diet.  Reduce your fat intake to less than 20-30% of your total daily calories. This is less than 50-60 g of fat per day.  Remember that you need some fat in your diet. Ask your dietician what your daily goal should be.  Choose nonfat and low-fat healthy foods. Look for the words  "nonfat," "low fat," or "fat free."  As a guide, look on the label and choose foods with less than 3 g of fat per serving. Eat only one serving.  Avoid alcohol.  Do not smoke. If you need help quitting, talk with your health care provider.  Eat small frequent meals instead of three large heavy meals. WHAT FOODS CAN I EAT? Grains Include healthy grains and starches such as potatoes, wheat bread, fiber-rich cereal, and brown rice. Choose whole grain options whenever possible. In adults, whole grains should account for 45-65% of your daily calories.  Fruits and Vegetables Eat plenty of fruits and vegetables. Fresh fruits and vegetables add fiber to your diet. Meats and Other Protein Sources Eat lean meat such as chicken and pork. Trim any fat off of meat before cooking it. Eggs, fish, and beans are other sources of protein. In adults, these foods should account for 10-35% of your daily calories. Dairy Choose low-fat milk and dairy options. Dairy includes fat and protein, as well as calcium.  Fats and Oils Limit high-fat foods such as fried foods, sweets, baked goods, sugary drinks.  Other Creamy sauces and condiments, such as mayonnaise, can add extra fat.  Think about whether or not you need to use them, or use smaller amounts or low fat options. WHAT FOODS ARE NOT RECOMMENDED?  High fat foods, such as:  [employer].  Ice cream.  [place] toast.  Sweet rolls.  Pizza.  Cheese bread.  Foods covered with batter, butter, creamy sauces, or cheese.  Fried foods.  Sugary drinks and desserts.  Foods that cause gas or bloating   This information is not intended to replace advice given to you by your health care provider. Make sure you discuss any questions you have with your health care provider.   Document Released: 12/04/2013 Document Reviewed: 12/04/2013 Elsevier Interactive Patient Education [employer].

## 2018-04-17 NOTE — Progress Notes (Signed)
Surgical Clinic History and Physical  Referring provider:  Pleas Koch, NP [place], [place] [address]  HISTORY OF PRESENT ILLNESS (HPI):  67 y.o. female presents for evaluation of worsening RUQ abdominal pain, which she describes has been worst after eating fatty foods in particular, such as pizza and foods patient describes as "greasy". Patient additionally describes epigastric to LUQ abdominal pain when she eats "late at night" with increased belching and a sour taste in her mouth. She denies having ever been treated with PPI or having previously underwent EGD. She otherwise denies fever/chills, CP, or SOB and reports B/L lower extremity lymphedema, which she attributes to and says started after she underwent TAH-BSO.  PAST MEDICAL HISTORY (PMH):  Past Medical History:  Diagnosis Date  . Acid reflux   . Anemia   . Arthritis   . Dizziness   . Gallstones   . Hematuria    microscopic  . HLD (hyperlipidemia)   . Hoarseness   . Hypertension   . Lower extremity edema   . Over weight   . Renal cyst   . Urge incontinence   . UTI (lower urinary tract infection)   . Vitamin D deficiency      PAST SURGICAL HISTORY ([place]):  Past Surgical History:  Procedure Laterality Date  . ABDOMINAL HYSTERECTOMY  1981  . BREAST BIOPSY  1995/2005  . HERNIA REPAIR  2005  . TUBAL LIGATION  1975     MEDICATIONS:  Prior to Admission medications   Medication Sig Start Date End Date Taking? Authorizing Provider  amLODipine (NORVASC) 10 MG tablet Take 1 tablet (10 mg total) by mouth daily. 08/23/17  Yes Pleas Koch, NP  BIOTIN PO Take 1 capsule by mouth daily.   Yes [provider]  cholecalciferol (VITAMIN D) 1000 units tablet Take 1,000 Units by mouth daily.   Yes [provider]  ibuprofen (MOTRIN IB) 200 MG tablet Take 2 tablets (400 mg total) by mouth every 8 (eight) hours as needed (for pain, with food.). 07/23/16  Yes Tonia Ghent, MD  Multiple  Vitamin (MULTIVITAMIN) capsule Take 1 capsule by mouth daily.   Yes [provider]  mupirocin ointment (BACTROBAN) 2 % Apply 1 application topically 2 (two) times daily. 01/18/17  Yes Edrick Kins, DPM  ondansetron (ZOFRAN ODT) 4 MG disintegrating tablet Take 1 tablet (4 mg total) by mouth every 8 (eight) hours as needed for nausea or vomiting. 04/10/18  Yes Alfred Levins, [place], MD  oxyCODONE-acetaminophen (PERCOCET) 5-325 MG tablet Take 1 tablet by mouth every 4 (four) hours as needed for severe pain. 04/10/18  Yes Veronese, [place], MD  omeprazole (PRILOSEC) 40 MG capsule Take 1 capsule (40 mg total) by mouth daily. 04/17/18   Vickie Epley, MD     ALLERGIES:  Allergies  Allergen Reactions  . Morphine Other (See Comments)    Unknown  . Clindamycin/Lincomycin Diarrhea  . Methylprednisolone Swelling  . Codeine Palpitations and Other (See Comments)    unknown  . Morphine And Related Palpitations  . Penicillins Rash and Other (See Comments)    unknown     SOCIAL HISTORY:  Social History   Socioeconomic History  . Marital status: Married    Spouse name: Not on file  . Number of children: Not on file  . Years of education: Not on file  . Highest education level: Not on file  Occupational History  . Not on file  Social Needs  . Emergency planning/management officer  strain: Not on file  . Food insecurity:    Worry: Not on file    Inability: Not on file  . Transportation needs:    Medical: Not on file    Non-medical: Not on file  Tobacco Use  . Smoking status: Never Smoker  . Smokeless tobacco: Never Used  Substance and Sexual Activity  . Alcohol use: No    Alcohol/week: 0.0 oz  . Drug use: No  . Sexual activity: Not on file  Lifestyle  . Physical activity:    Days per week: Not on file    Minutes per session: Not on file  . Stress: Not on file  Relationships  . Social connections:    Talks on phone: Not on file    Gets together: Not on file    Attends religious service: Not  on file    Active member of club or organization: Not on file    Attends meetings of clubs or organizations: Not on file    Relationship status: Not on file  . Intimate partner violence:    Fear of current or ex partner: Not on file    Emotionally abused: Not on file    Physically abused: Not on file    Forced sexual activity: Not on file  Other Topics Concern  . Not on file  Social History Narrative  . Not on file    The patient currently resides (home / rehab facility / nursing home): Home The patient normally is (ambulatory / bedbound): Ambulatory  FAMILY HISTORY:  Family History  Problem Relation Age of Onset  . Cancer Paternal Aunt   . Stroke Paternal Uncle   . Prostate cancer Neg Hx   . Kidney disease Neg Hx   . Bladder Cancer Neg Hx     Otherwise negative/non-contributory.  REVIEW OF SYSTEMS:  Constitutional: denies any other weight loss, fever, chills, or sweats  Eyes: denies any other vision changes, history of eye injury  ENT: denies sore throat, hearing problems  Respiratory: denies shortness of breath, wheezing  Cardiovascular: denies chest pain, palpitations  Gastrointestinal: abdominal pain, N/V, and bowel function as per HPI Musculoskeletal: denies any other joint pains or cramps  Skin: Denies any other rashes or skin discolorations except B/L lower extremity lymphedema as per HPI Neurological: denies any other headache, dizziness, weakness  Psychiatric: Denies any other depression, anxiety   All other review of systems were otherwise negative   VITAL SIGNS:  BP (!) 149/95   Pulse 94   Temp 98 F (36.7 C) (Oral)   Ht 5\' 4"  (1.626 m)   Wt 239 lb 9.6 oz (108.7 kg)   BMI 41.13 kg/m   PHYSICAL EXAM:  Constitutional:  -- Obese body habitus  -- Awake, alert, and oriented x3  Eyes:  -- Pupils equally round and reactive to light  -- No scleral icterus  Ear, nose, throat:  -- No jugular venous distension -- No nasal drainage, bleeding Pulmonary:   -- No crackles  -- Equal breath sounds bilaterally -- Breathing non-labored at rest Cardiovascular:  -- S1, S2 present  -- No pericardial rubs  Gastrointestinal:  -- Abdomen soft and non-distended with moderate RUQ abdominal tenderness to palpation and minimal epigastric tenderness to palpation, no guarding/rebound tenderness -- No abdominal masses appreciated, pulsatile or otherwise  Musculoskeletal and Integumentary:  -- Wounds or skin discoloration: None appreciated -- Extremities: B/L UE and LE FROM, hands and feet warm, B/L lower extremity lymphedema  Neurologic:  -- Motor function:  Intact and symmetric -- Sensation: Intact and symmetric  Labs:  CBC Latest Ref Rng & Units 04/14/2018 04/10/2018 04/08/2017  WBC 4.0 - 10.5 K/uL 6.3 8.7 8.7  Hemoglobin 12.0 - 15.0 g/dL 11.8(L) 11.7(L) 12.6  Hematocrit 36.0 - 46.0 % 35.8(L) 35.4 39.0  Platelets 150.0 - 400.0 K/uL 248.0 237 261.0   CMP Latest Ref Rng & Units 04/14/2018 04/10/2018 04/08/2017  Glucose 70 - 99 mg/dL 97 148(H) 125(H)  BUN 6 - 23 mg/dL 17 22(H) 19  Creatinine 0.40 - 1.20 mg/dL 0.92 1.01(H) 0.92  Sodium 135 - 145 mEq/L 145 139 139  Potassium 3.5 - 5.1 mEq/L 3.6 3.4(L) 3.9  Chloride 96 - 112 mEq/L 110 108 106  CO2 19 - 32 mEq/L 29 23 25   Calcium 8.4 - 10.5 mg/dL 8.9 8.9 9.6  Total Protein 6.0 - 8.3 g/dL 6.7 7.0 7.2  Total Bilirubin 0.2 - 1.2 mg/dL 0.5 0.5 0.4  Alkaline Phos 39 - 117 U/L 69 63 81  AST 0 - 37 U/L 15 21 18   ALT 0 - 35 U/L 12 17 19     Imaging studies:  Limited RUQ Abdominal Ultrasound (04/10/2018) There is a 2.7 cm non mobile stone in the neck of the gallbladder. There is also a small amount of sludge in the gallbladder. No gallbladder wall thickening. Negative sonographic Murphy's sign.  Common bile duct diameter: 5.9 mm  CT Abdomen and Pelvis with Contrast (04/10/2018) - personally reviewed and discussed with patient 1. Gallbladder mildly distended without overt cholecystitis change by CT. This  appearance may well warrant ultrasound of the gallbladder to further evaluate given patient's symptoms. 2. Sigmoid diverticulosis without diverticulitis. No bowel obstruction. No abscess. Appendix appears normal. 3. Subcentimeter mesenteric lymph nodes, regarded as nonspecific. In the appropriate clinical setting, these lymph nodes potentially could represent a degree of mesenteric adenitis. 4.  No evident renal or ureteral calculus.  No hydronephrosis. 5.  Small hiatal hernia.  Status post ventral hernia repair. 6.  Aortoiliac atherosclerosis. 7.  Uterus absent.  Aortic Atherosclerosis   Assessment/Plan: (ICD-10's: K80.20) 67 y.o. female with symptoms and imaging consistent with symptomatic cholelithiasis vs chronic cholecystitis, complicated by likely untreated GERD and by pertinent comorbidities including morbid obesity (BMI >41), HTN, HLD, B/L lower extremity lymphedema, and osteoarthritis.   - PPI (daily omeprazole prescribed)  - offered dietary changes + follow-up vs lap chole              - avoid/minimize foods with higher fat content (meats, cheeses/dairy, and fried)             - prefer low-fat vegetables, whole grains (wheat bread, ceareals, etc), and fruits until cholecystectomy              - all risks, benefits, and alternatives to cholecystectomy were discussed with the patient, all of his questions were answered to his expressed satisfaction, patient expresses he wishes to proceed, and informed consent was obtained.             - will plan for laparoscopic cholecystectomy May 21 - 24 pending OR availability  - importance of wearing compression stockings every day was discussed             - anticipate return to clinic 2 weeks after above planned surgery             - instructed to call if any questions or concerns  All of the above recommendations were discussed with the patient, and all of patient's questions  were answered to her expressed satisfaction.  Thank you for  the opportunity to participate in this patient's care.  -- Marilynne Drivers Rosana Hoes, MD, [place]: [facility] - Partnering for exceptional care. Office: [phone]

## 2018-04-17 NOTE — H&P (View-Only) (Signed)
Surgical Clinic History and Physical  Referring provider:  Pleas Koch, NP [place], [place] [address]  HISTORY OF PRESENT ILLNESS (HPI):  67 y.o. female presents for evaluation of worsening RUQ abdominal pain, which she describes has been worst after eating fatty foods in particular, such as pizza and foods patient describes as "greasy". Patient additionally describes epigastric to LUQ abdominal pain when she eats "late at night" with increased belching and a sour taste in her mouth. She denies having ever been treated with PPI or having previously underwent EGD. She otherwise denies fever/chills, CP, or SOB and reports B/L lower extremity lymphedema, which she attributes to and says started after she underwent TAH-BSO.  PAST MEDICAL HISTORY (PMH):  Past Medical History:  Diagnosis Date  . Acid reflux   . Anemia   . Arthritis   . Dizziness   . Gallstones   . Hematuria    microscopic  . HLD (hyperlipidemia)   . Hoarseness   . Hypertension   . Lower extremity edema   . Over weight   . Renal cyst   . Urge incontinence   . UTI (lower urinary tract infection)   . Vitamin D deficiency      PAST SURGICAL HISTORY ([place]):  Past Surgical History:  Procedure Laterality Date  . ABDOMINAL HYSTERECTOMY  1981  . BREAST BIOPSY  1995/2005  . HERNIA REPAIR  2005  . TUBAL LIGATION  1975     MEDICATIONS:  Prior to Admission medications   Medication Sig Start Date End Date Taking? Authorizing Provider  amLODipine (NORVASC) 10 MG tablet Take 1 tablet (10 mg total) by mouth daily. 08/23/17  Yes Pleas Koch, NP  BIOTIN PO Take 1 capsule by mouth daily.   Yes [provider]  cholecalciferol (VITAMIN D) 1000 units tablet Take 1,000 Units by mouth daily.   Yes [provider]  ibuprofen (MOTRIN IB) 200 MG tablet Take 2 tablets (400 mg total) by mouth every 8 (eight) hours as needed (for pain, with food.). 07/23/16  Yes Tonia Ghent, MD  Multiple  Vitamin (MULTIVITAMIN) capsule Take 1 capsule by mouth daily.   Yes [provider]  mupirocin ointment (BACTROBAN) 2 % Apply 1 application topically 2 (two) times daily. 01/18/17  Yes Edrick Kins, DPM  ondansetron (ZOFRAN ODT) 4 MG disintegrating tablet Take 1 tablet (4 mg total) by mouth every 8 (eight) hours as needed for nausea or vomiting. 04/10/18  Yes Alfred Levins, [place], MD  oxyCODONE-acetaminophen (PERCOCET) 5-325 MG tablet Take 1 tablet by mouth every 4 (four) hours as needed for severe pain. 04/10/18  Yes Veronese, [place], MD  omeprazole (PRILOSEC) 40 MG capsule Take 1 capsule (40 mg total) by mouth daily. 04/17/18   Vickie Epley, MD     ALLERGIES:  Allergies  Allergen Reactions  . Morphine Other (See Comments)    Unknown  . Clindamycin/Lincomycin Diarrhea  . Methylprednisolone Swelling  . Codeine Palpitations and Other (See Comments)    unknown  . Morphine And Related Palpitations  . Penicillins Rash and Other (See Comments)    unknown     SOCIAL HISTORY:  Social History   Socioeconomic History  . Marital status: Married    Spouse name: Not on file  . Number of children: Not on file  . Years of education: Not on file  . Highest education level: Not on file  Occupational History  . Not on file  Social Needs  . Emergency planning/management officer  strain: Not on file  . Food insecurity:    Worry: Not on file    Inability: Not on file  . Transportation needs:    Medical: Not on file    Non-medical: Not on file  Tobacco Use  . Smoking status: Never Smoker  . Smokeless tobacco: Never Used  Substance and Sexual Activity  . Alcohol use: No    Alcohol/week: 0.0 oz  . Drug use: No  . Sexual activity: Not on file  Lifestyle  . Physical activity:    Days per week: Not on file    Minutes per session: Not on file  . Stress: Not on file  Relationships  . Social connections:    Talks on phone: Not on file    Gets together: Not on file    Attends religious service: Not  on file    Active member of club or organization: Not on file    Attends meetings of clubs or organizations: Not on file    Relationship status: Not on file  . Intimate partner violence:    Fear of current or ex partner: Not on file    Emotionally abused: Not on file    Physically abused: Not on file    Forced sexual activity: Not on file  Other Topics Concern  . Not on file  Social History Narrative  . Not on file    The patient currently resides (home / rehab facility / nursing home): Home The patient normally is (ambulatory / bedbound): Ambulatory  FAMILY HISTORY:  Family History  Problem Relation Age of Onset  . Cancer Paternal Aunt   . Stroke Paternal Uncle   . Prostate cancer Neg Hx   . Kidney disease Neg Hx   . Bladder Cancer Neg Hx     Otherwise negative/non-contributory.  REVIEW OF SYSTEMS:  Constitutional: denies any other weight loss, fever, chills, or sweats  Eyes: denies any other vision changes, history of eye injury  ENT: denies sore throat, hearing problems  Respiratory: denies shortness of breath, wheezing  Cardiovascular: denies chest pain, palpitations  Gastrointestinal: abdominal pain, N/V, and bowel function as per HPI Musculoskeletal: denies any other joint pains or cramps  Skin: Denies any other rashes or skin discolorations except B/L lower extremity lymphedema as per HPI Neurological: denies any other headache, dizziness, weakness  Psychiatric: Denies any other depression, anxiety   All other review of systems were otherwise negative   VITAL SIGNS:  BP (!) 149/95   Pulse 94   Temp 98 F (36.7 C) (Oral)   Ht 5\' 4"  (1.626 m)   Wt 239 lb 9.6 oz (108.7 kg)   BMI 41.13 kg/m   PHYSICAL EXAM:  Constitutional:  -- Obese body habitus  -- Awake, alert, and oriented x3  Eyes:  -- Pupils equally round and reactive to light  -- No scleral icterus  Ear, nose, throat:  -- No jugular venous distension -- No nasal drainage, bleeding Pulmonary:   -- No crackles  -- Equal breath sounds bilaterally -- Breathing non-labored at rest Cardiovascular:  -- S1, S2 present  -- No pericardial rubs  Gastrointestinal:  -- Abdomen soft and non-distended with moderate RUQ abdominal tenderness to palpation and minimal epigastric tenderness to palpation, no guarding/rebound tenderness -- No abdominal masses appreciated, pulsatile or otherwise  Musculoskeletal and Integumentary:  -- Wounds or skin discoloration: None appreciated -- Extremities: B/L UE and LE FROM, hands and feet warm, B/L lower extremity lymphedema  Neurologic:  -- Motor function:  Intact and symmetric -- Sensation: Intact and symmetric  Labs:  CBC Latest Ref Rng & Units 04/14/2018 04/10/2018 04/08/2017  WBC 4.0 - 10.5 K/uL 6.3 8.7 8.7  Hemoglobin 12.0 - 15.0 g/dL 11.8(L) 11.7(L) 12.6  Hematocrit 36.0 - 46.0 % 35.8(L) 35.4 39.0  Platelets 150.0 - 400.0 K/uL 248.0 237 261.0   CMP Latest Ref Rng & Units 04/14/2018 04/10/2018 04/08/2017  Glucose 70 - 99 mg/dL 97 148(H) 125(H)  BUN 6 - 23 mg/dL 17 22(H) 19  Creatinine 0.40 - 1.20 mg/dL 0.92 1.01(H) 0.92  Sodium 135 - 145 mEq/L 145 139 139  Potassium 3.5 - 5.1 mEq/L 3.6 3.4(L) 3.9  Chloride 96 - 112 mEq/L 110 108 106  CO2 19 - 32 mEq/L 29 23 25   Calcium 8.4 - 10.5 mg/dL 8.9 8.9 9.6  Total Protein 6.0 - 8.3 g/dL 6.7 7.0 7.2  Total Bilirubin 0.2 - 1.2 mg/dL 0.5 0.5 0.4  Alkaline Phos 39 - 117 U/L 69 63 81  AST 0 - 37 U/L 15 21 18   ALT 0 - 35 U/L 12 17 19     Imaging studies:  Limited RUQ Abdominal Ultrasound (04/10/2018) There is a 2.7 cm non mobile stone in the neck of the gallbladder. There is also a small amount of sludge in the gallbladder. No gallbladder wall thickening. Negative sonographic Murphy's sign.  Common bile duct diameter: 5.9 mm  CT Abdomen and Pelvis with Contrast (04/10/2018) - personally reviewed and discussed with patient 1. Gallbladder mildly distended without overt cholecystitis change by CT. This  appearance may well warrant ultrasound of the gallbladder to further evaluate given patient's symptoms. 2. Sigmoid diverticulosis without diverticulitis. No bowel obstruction. No abscess. Appendix appears normal. 3. Subcentimeter mesenteric lymph nodes, regarded as nonspecific. In the appropriate clinical setting, these lymph nodes potentially could represent a degree of mesenteric adenitis. 4.  No evident renal or ureteral calculus.  No hydronephrosis. 5.  Small hiatal hernia.  Status post ventral hernia repair. 6.  Aortoiliac atherosclerosis. 7.  Uterus absent.  Aortic Atherosclerosis   Assessment/Plan: (ICD-10's: K80.20) 67 y.o. female with symptoms and imaging consistent with symptomatic cholelithiasis vs chronic cholecystitis, complicated by likely untreated GERD and by pertinent comorbidities including morbid obesity (BMI >41), HTN, HLD, B/L lower extremity lymphedema, and osteoarthritis.   - PPI (daily omeprazole prescribed)  - offered dietary changes + follow-up vs lap chole              - avoid/minimize foods with higher fat content (meats, cheeses/dairy, and fried)             - prefer low-fat vegetables, whole grains (wheat bread, ceareals, etc), and fruits until cholecystectomy              - all risks, benefits, and alternatives to cholecystectomy were discussed with the patient, all of his questions were answered to his expressed satisfaction, patient expresses he wishes to proceed, and informed consent was obtained.             - will plan for laparoscopic cholecystectomy May 21 - 24 pending OR availability  - importance of wearing compression stockings every day was discussed             - anticipate return to clinic 2 weeks after above planned surgery             - instructed to call if any questions or concerns  All of the above recommendations were discussed with the patient, and all of patient's questions  were answered to her expressed satisfaction.  Thank you for  the opportunity to participate in this patient's care.  -- Marilynne Drivers Rosana Hoes, MD, [place]: [facility] - Partnering for exceptional care. Office: [phone]

## 2018-04-18 ENCOUNTER — Encounter: Payer: Self-pay | Admitting: Surgery

## 2018-04-19 ENCOUNTER — Ambulatory Visit: Payer: 59 | Admitting: Surgery

## 2018-04-21 ENCOUNTER — Telehealth: Payer: Self-pay | Admitting: Surgery

## 2018-04-21 NOTE — Telephone Encounter (Signed)
Pt advised of pre op date/time and sx date. Sx: 05/02/18 with Dr Davis--laparoscopic cholecystectomy.  Pre op: 04/24/18 between 1-5:00pm. Phone interview.   Patient made aware to call [phone], between 1-3:00pm the day before surgery, to find out what time to arrive.

## 2018-04-24 ENCOUNTER — Inpatient Hospital Stay
Admission: RE | Admit: 2018-04-24 | Discharge: 2018-04-24 | Disposition: A | Discharge status: Home / Self Care | Payer: Medicare HMO | Source: Ambulatory Visit

## 2018-04-24 NOTE — Pre-Procedure Instructions (Signed)
[facility]  ECHO COMPLETE WO IMAGING ENHANCING AGENT  Order# [id number]  Reading physician: Minna Merritts, MD Ordering physician: Pleas Koch, NP Study date: 09/26/15  Result Notes for ECHOCARDIOGRAM COMPLETE   Notes Recorded by Jacqualin Combes, CMA on 09/29/2015 at 10:44 AM Message left for patient to return my call.   ------  Notes Recorded by Pleas Koch, NP on 09/29/2015 at 7:50 AM Please notify [name] that her echocardiogram was normal. Her heart is functioning well.      Study Result   Result status: Final result                           *[facility]                        [place], [place] [address]                            [phone]  ------------------------------------------------------------------- Transthoracic Echocardiography  Patient:    Latorsha, Curling MR #:       [id number] Study Date: 09/26/2015 Gender:     F Age:        67 Height:     165.1 cm Weight:     109.8 kg BSA:        2.3 m^2 Pt. Status: Room:   ATTENDING    Default, Provider [phone]  Baruch Merl, Leticia Penna  REFERRING    Pleas Koch  PERFORMING   [place], [place]  SONOGRAPHER  Pilar Jarvis, RVT, RDCS, RDMS  cc:  ------------------------------------------------------------------- LV EF: 60% -   65%  ------------------------------------------------------------------- History:   PMH:  Edema, cardiomegaly.  Dyspnea.  Risk factors: Hypertension. Morbidly obese.  ------------------------------------------------------------------- Study Conclusions  - Left ventricle: The cavity size was normal. There was mild   concentric hypertrophy. Systolic function was normal. The   estimated ejection fraction was in the range of 60% to 65%. Wall   motion was normal; there were no regional wall motion   abnormalities. Doppler parameters are consistent with abnormal   left ventricular  relaxation (grade 1 diastolic dysfunction). - Left atrium: The atrium was normal in size. - Right ventricle: Systolic function was normal. - Pulmonary arteries: Systolic pressure was within the normal   range.  Transthoracic echocardiography.  M-mode, complete 2D, spectral Doppler, and color Doppler.  Birthdate:  Patient birthdate: Jan 08, 1951.  Age:  Patient is 67 yr old.  Sex:  Gender: female. BMI: 40.3 kg/m^2.  Blood pressure:     150/84  Patient status: Outpatient.  Study date:  Study date: 09/26/2015. Study time: 11:47 AM.  -------------------------------------------------------------------  ------------------------------------------------------------------- Left ventricle:  The cavity size was normal. There was mild concentric hypertrophy. Systolic function was normal. The estimated ejection fraction was in the range of 60% to 65%. Wall motion was normal; there were no regional wall motion abnormalities. The transmitral flow pattern was normal. The deceleration time of the early transmitral flow velocity was normal. The pulmonary vein flow pattern was normal. The tissue Doppler parameters were normal. Doppler parameters are consistent with abnormal left ventricular relaxation (grade 1 diastolic dysfunction).  ------------------------------------------------------------------- Aortic valve:   Trileaflet; normal thickness leaflets. Mobility was not restricted.  Doppler:  Transvalvular velocity was  within the normal range. There was no stenosis. There was no regurgitation. VTI ratio of LVOT to aortic valve: 0.75. Valve area (VTI): 2.85 cm^2. Indexed valve area (VTI): 1.24 cm^2/m^2. Peak velocity ratio of LVOT to aortic valve: 0.79. Valve area (Vmax): 2.99 cm^2. Indexed valve area (Vmax): 1.3 cm^2/m^2. Mean velocity ratio of LVOT to aortic valve: 0.85. Valve area (Vmean): 3.24 cm^2. Indexed valve area (Vmean): 1.41 cm^2/m^2.    Mean gradient (S): 4 mm Hg. Peak gradient (S):  11 mm Hg.  ------------------------------------------------------------------- Aorta:  Aortic root: The aortic root was normal in size.  ------------------------------------------------------------------- Mitral valve:   Structurally normal valve.   Mobility was not restricted.  Doppler:  Transvalvular velocity was within the normal range. There was no evidence for stenosis. There was no regurgitation.  ------------------------------------------------------------------- Left atrium:  The atrium was normal in size.  ------------------------------------------------------------------- Right ventricle:  The cavity size was normal. Wall thickness was normal. Systolic function was normal.  ------------------------------------------------------------------- Pulmonic valve:    Structurally normal valve.   Cusp separation was normal.  Doppler:  Transvalvular velocity was within the normal range. There was no evidence for stenosis. There was trivial regurgitation.  ------------------------------------------------------------------- Tricuspid valve:   Structurally normal valve.    Doppler: Transvalvular velocity was within the normal range. There was no regurgitation.  ------------------------------------------------------------------- Pulmonary artery:   The main pulmonary artery was normal-sized. Systolic pressure was within the normal range.  ------------------------------------------------------------------- Right atrium:  The atrium was normal in size.  ------------------------------------------------------------------- Pericardium:  There was no pericardial effusion.  ------------------------------------------------------------------- Systemic veins: Inferior vena cava: The vessel was normal in size.  ------------------------------------------------------------------- Measurements   Left ventricle                            Value          Reference  LV ID, ED,  PLAX chordal           (L)     39.2  mm       43 - 52  LV PW thickness, ED                       12.5  mm       ---------  IVS/LV PW ratio, ED                       0.97           <=1.3  Stroke volume, 2D                         87    ml       ---------  Stroke volume/bsa, 2D                     38    ml/m^2   ---------  LV e&', lateral                            9.74  cm/s     ---------  LV E/e&', lateral                          6.97           ---------  LV e&', medial  7.84  cm/s     ---------  LV E/e&', medial                           8.66           ---------  LV e&', average                            8.79  cm/s     ---------  LV E/e&', average                          7.72           ---------    Ventricular septum                        Value          Reference  IVS thickness, ED                         12.1  mm       ---------    LVOT                                      Value          Reference  LVOT ID, S                                22    mm       ---------  LVOT area                                 3.8   cm^2     ---------  LVOT ID                                   22    mm       ---------  LVOT peak velocity, S                     133   cm/s     ---------  LVOT mean velocity, S                     81.4  cm/s     ---------  LVOT VTI, S                               22.9  cm       ---------  LVOT peak gradient, S                     7     mm Hg    ---------  Stroke volume (SV), LVOT DP               87.1  ml       ---------  Stroke index (SV/bsa), LVOT DP            37.9  ml/m^2   ---------  Aortic valve                              Value          Reference  Aortic valve peak velocity, S             169   cm/s     ---------  Aortic valve mean velocity, S             95.4  cm/s     ---------  Aortic valve VTI, S                       30.5  cm       ---------  Aortic mean gradient, S                   4     mm Hg    ---------  Aortic peak  gradient, S                   11    mm Hg    ---------  VTI ratio, LVOT/AV                        0.75           ---------  Aortic valve area, VTI                    2.85  cm^2     ---------  Aortic valve area/bsa, VTI                1.24  cm^2/m^2 ---------  Velocity ratio, peak, LVOT/AV             0.79           ---------  Aortic valve area, peak velocity          2.99  cm^2     ---------  Aortic valve area/bsa, peak               1.3   cm^2/m^2 ---------  velocity  Velocity ratio, mean, LVOT/AV             0.85           ---------  Aortic valve area, mean velocity          3.24  cm^2     ---------  Aortic valve area/bsa, mean               1.41  cm^2/m^2 ---------  velocity    Aorta                                     Value          Reference  Aortic root ID, ED                        27    mm       ---------  Ascending aorta ID, A-P, S                24    mm       ---------    Left atrium  Value          Reference  LA ID, A-P, ES                            35    mm       ---------  LA ID/bsa, A-P                            1.52  cm/m^2   <=2.2  LA volume, S                              53    ml       ---------  LA volume/bsa, S                          23.1  ml/m^2   ---------  LA volume, ES, 1-p A4C                    40    ml       ---------  LA volume/bsa, ES, 1-p A4C                17.4  ml/m^2   ---------  LA volume, ES, 1-p A2C                    67    ml       ---------  LA volume/bsa, ES, 1-p A2C                29.1  ml/m^2   ---------    Mitral valve                              Value          Reference  Mitral E-wave peak velocity               67.9  cm/s     ---------  Mitral A-wave peak velocity               79.3  cm/s     ---------  Mitral deceleration time                  225   ms       150 - 230  Mitral E/A ratio, peak                    0.9            ---------    Pulmonic valve                            Value          Reference   Pulmonic regurg velocity, ED              89.6  cm/s     ---------  Legend: (L)  and  (H)  mark values outside specified reference range.  ------------------------------------------------------------------- Prepared and Electronically Authenticated by  Esmond Plants, MD, [facility] 2016-10-14T19:52:20  MERGE Images   Show images for ECHOCARDIOGRAM COMPLETE  Patient Information   Patient Name Elisavet, Buehrer Sex Female DOB [birth year] SSN [id number]  Reason for Exam  Priority: Routine  Other - See Comments Section  Dx: Bilateral edema of lower extremity [R60.0 (ICD-10-CM)]; Dyspnea [R06.00 (ICD-10-CM)]; Anemia, unspecified anemia type [D64.9 (ICD-10-CM)]; Cardiomegaly [I51.7 (ICD-10-CM)]  Comments: 67 year old female with lower extremity edema, labs and ultrasound negative for embolus. Exertion SOB, mild cardiomegaly on xray. Rule out CHF.  Surgical History   Surgical History   No past medical history on file.    Other Surgical History   Procedure Laterality Date Comment Source  ABDOMINAL HYSTERECTOMY  1981  Provider  BREAST BIOPSY  1995/2005  Provider  HERNIA REPAIR  2005  Provider  TUBAL LIGATION  1975  Provider    Performing Technologist/Nurse   Performing Technologist/Nurse: Pilar Jarvis T                    Implants    No active implants to display in this view.  Order-Level Documents:   There are no order-level documents.  Encounter-Level Documents - 09/26/2015:   Electronic signature on 09/26/2015 11:37 AM - Signed      Signed   Electronically signed by Minna Merritts, MD on 09/26/15 at 1952 EDT  Printable Result Report   Result Report   External Result Report   External

## 2018-04-25 ENCOUNTER — Encounter
Admission: RE | Admit: 2018-04-25 | Discharge: 2018-04-25 | Disposition: A | Discharge status: Home / Self Care | Payer: Medicare HMO | Source: Ambulatory Visit | Attending: Surgery | Admitting: Surgery

## 2018-04-25 ENCOUNTER — Other Ambulatory Visit: Payer: Self-pay

## 2018-04-25 DIAGNOSIS — Z01818 Encounter for other preprocedural examination: Secondary | ICD-10-CM | POA: Insufficient documentation

## 2018-04-25 HISTORY — DX: Other specified postprocedural states: Z98.890

## 2018-04-25 HISTORY — DX: Sleep apnea, unspecified: G47.30

## 2018-04-25 HISTORY — DX: Family history of other specified conditions: Z84.89

## 2018-04-25 HISTORY — DX: Adverse effect of unspecified anesthetic, initial encounter: T41.45XA

## 2018-04-25 HISTORY — DX: Personal history of urinary calculi: Z87.442

## 2018-04-25 HISTORY — DX: Nausea with vomiting, unspecified: R11.2

## 2018-04-25 HISTORY — DX: Malignant (primary) neoplasm, unspecified: C80.1

## 2018-04-25 HISTORY — DX: Unspecified convulsions: R56.9

## 2018-04-25 HISTORY — DX: Other complications of anesthesia, initial encounter: T88.59XA

## 2018-04-25 HISTORY — DX: Concussion with loss of consciousness of unspecified duration, initial encounter: S06.0X9A

## 2018-04-25 HISTORY — DX: Localized edema: R60.0

## 2018-04-25 HISTORY — DX: Personal history of other diseases of the digestive system: Z87.19

## 2018-04-25 NOTE — Patient Instructions (Signed)
Your procedure is scheduled on: 05-02-18 TUESDAY Report to Same Day Surgery 2nd floor medical mall [facility] Entrance-take elevator on left to 2nd floor.  Check in with surgery information desk.) To find out your arrival time please call [phone] between 1PM - 3PM on 05-01-18  Remember: Instructions that are not followed completely may result in serious medical risk, up to and including death, or upon the discretion of your surgeon and anesthesiologist your surgery may need to be rescheduled.    _x___ 1. Do not eat food after midnight the night before your procedure. NO GUM OR CANDY AFTER MIDNIGHT.  You may drink clear liquids up to 2 hours before you are scheduled to arrive at the hospital for your procedure.  Do not drink clear liquids within 2 hours of your scheduled arrival to the hospital.  Clear liquids include  --Water or Apple juice without pulp  --Clear carbohydrate beverage such as ClearFast or Gatorade  --Black Coffee or Clear Tea (No milk, no creamers, do not add anything to the coffee or Tea     __x__ 2. No Alcohol for 24 hours before or after surgery.   __x__3. No Smoking or e-cigarettes for 24 prior to surgery.  Do not use any chewable tobacco products for at least 6 hour prior to surgery   ____  4. Bring all medications with you on the day of surgery if instructed.    __x__ 5. Notify your doctor if there is any change in your medical condition     (cold, fever, infections).    x___6. On the morning of surgery brush your teeth with toothpaste and water.  You may rinse your mouth with mouth wash if you wish.  Do not swallow any toothpaste or mouthwash.   Do not wear jewelry, make-up, hairpins, clips or nail polish.  Do not wear lotions, powders, or perfumes. You may wear deodorant.  Do not shave 48 hours prior to surgery. Men may shave face and neck.  Do not bring valuables to the hospital.    [facility] is not responsible for any belongings or valuables.            Contacts, dentures or bridgework may not be worn into surgery.  Leave your suitcase in the car. After surgery it may be brought to your room.  For patients admitted to the hospital, discharge time is determined by your treatment team.  _  Patients discharged the day of surgery will not be allowed to drive home.  You will need someone to drive you home and stay with you the night of your procedure.    Please read over the following fact sheets that you were given:   [facility] Preparing for Surgery and or MRSA Information   _x___ TAKE THE FOLLOWING MEDICATION THE MORNING OF SURGERY WITH A SMALL SIP OF WATER. These include:  1. PRILOSEC (OMEPRAZOLE)  2.  3.  4.  5.  6.  ____Fleets enema or Magnesium Citrate as directed.   _x___ Use CHG Soap or sage wipes as directed on instruction sheet   ____ Use inhalers on the day of surgery and bring to hospital day of surgery  ____ Stop Metformin and Janumet 2 days prior to surgery.    ____ Take 1/2 of usual insulin dose the night before surgery and none on the morning surgery.   ____ Follow recommendations from Cardiologist, Pulmonologist or PCP regarding stopping Aspirin, Coumadin, Plavix ,Eliquis, Effient, or Pradaxa, and Pletal.  X____Stop Anti-inflammatories  such as Advil, Aleve, Ibuprofen, Motrin, Naproxen, Naprosyn, Goodies powders or aspirin products NOW-OK to take Tylenol    _x___ Stop supplements until after surgery-PT STATES SHE HAS NOT TAKEN BIOTIN IN A WHILE-INSTRUCTED TO WAIT UNTIL AFTER SURGERY TO START BACK   ____ Bring C-Pap to the hospital.

## 2018-04-27 ENCOUNTER — Telehealth: Payer: Self-pay | Admitting: Surgery

## 2018-04-27 NOTE — Telephone Encounter (Signed)
Patient has called and would like to reschedule her surgery due to a death in the family. I advised the patient that I will follow up with her and inform her of a new surgery date. Surgery was scheduled for 05/02/18 with Dr Rosana Hoes.   Surgery has been rescheduled to 05/10/18 when Dr Rosana Hoes is back on Days in the OR.

## 2018-04-28 ENCOUNTER — Inpatient Hospital Stay: Admission: RE | Admit: 2018-04-28 | Payer: Medicare HMO | Source: Ambulatory Visit

## 2018-05-04 ENCOUNTER — Encounter
Admission: RE | Admit: 2018-05-04 | Discharge: 2018-05-04 | Disposition: A | Discharge status: Home / Self Care | Payer: Medicare HMO | Source: Ambulatory Visit | Attending: Surgery | Admitting: Surgery

## 2018-05-04 ENCOUNTER — Ambulatory Visit
Admission: RE | Admit: 2018-05-04 | Discharge: 2018-05-04 | Disposition: A | Discharge status: Home / Self Care | Payer: Medicare HMO | Source: Ambulatory Visit | Attending: Surgery | Admitting: Surgery

## 2018-05-04 DIAGNOSIS — Z01811 Encounter for preprocedural respiratory examination: Secondary | ICD-10-CM | POA: Diagnosis not present

## 2018-05-04 DIAGNOSIS — K8020 Calculus of gallbladder without cholecystitis without obstruction: Secondary | ICD-10-CM

## 2018-05-04 DIAGNOSIS — J9809 Other diseases of bronchus, not elsewhere classified: Secondary | ICD-10-CM | POA: Diagnosis not present

## 2018-05-04 DIAGNOSIS — J986 Disorders of diaphragm: Secondary | ICD-10-CM | POA: Diagnosis not present

## 2018-05-09 ENCOUNTER — Encounter: Payer: Self-pay | Admitting: *Deleted

## 2018-05-09 MED ORDER — CIPROFLOXACIN IN D5W 400 MG/200ML IV SOLN
400.0000 mg | INTRAVENOUS | Status: AC
  Administered 2018-05-10: 400 mg via INTRAVENOUS

## 2018-05-10 ENCOUNTER — Encounter
Admission: RE | Disposition: A | Discharge status: Home / Self Care | Payer: Self-pay | Source: Ambulatory Visit | Attending: Surgery

## 2018-05-10 ENCOUNTER — Other Ambulatory Visit: Payer: Self-pay

## 2018-05-10 ENCOUNTER — Ambulatory Visit
Admission: RE | Admit: 2018-05-10 | Discharge: 2018-05-10 | Disposition: A | Discharge status: Home / Self Care | Payer: Medicare HMO | Source: Ambulatory Visit | Attending: Surgery | Admitting: Surgery

## 2018-05-10 ENCOUNTER — Encounter: Payer: Self-pay | Admitting: *Deleted

## 2018-05-10 ENCOUNTER — Ambulatory Visit: Payer: Medicare HMO | Admitting: Certified Registered"

## 2018-05-10 ENCOUNTER — Other Ambulatory Visit: Payer: Self-pay | Admitting: Surgery

## 2018-05-10 DIAGNOSIS — Z79891 Long term (current) use of opiate analgesic: Secondary | ICD-10-CM | POA: Diagnosis not present

## 2018-05-10 DIAGNOSIS — Z6841 Body Mass Index (BMI) 40.0 and over, adult: Secondary | ICD-10-CM | POA: Insufficient documentation

## 2018-05-10 DIAGNOSIS — I10 Essential (primary) hypertension: Secondary | ICD-10-CM | POA: Diagnosis not present

## 2018-05-10 DIAGNOSIS — G4730 Sleep apnea, unspecified: Secondary | ICD-10-CM | POA: Diagnosis not present

## 2018-05-10 DIAGNOSIS — E559 Vitamin D deficiency, unspecified: Secondary | ICD-10-CM | POA: Diagnosis not present

## 2018-05-10 DIAGNOSIS — K8010 Calculus of gallbladder with chronic cholecystitis without obstruction: Secondary | ICD-10-CM | POA: Diagnosis not present

## 2018-05-10 DIAGNOSIS — I739 Peripheral vascular disease, unspecified: Secondary | ICD-10-CM | POA: Insufficient documentation

## 2018-05-10 DIAGNOSIS — E785 Hyperlipidemia, unspecified: Secondary | ICD-10-CM | POA: Diagnosis not present

## 2018-05-10 DIAGNOSIS — K219 Gastro-esophageal reflux disease without esophagitis: Secondary | ICD-10-CM | POA: Diagnosis not present

## 2018-05-10 DIAGNOSIS — K8020 Calculus of gallbladder without cholecystitis without obstruction: Secondary | ICD-10-CM | POA: Diagnosis not present

## 2018-05-10 DIAGNOSIS — Z79899 Other long term (current) drug therapy: Secondary | ICD-10-CM | POA: Diagnosis not present

## 2018-05-10 HISTORY — PX: LAPAROSCOPIC LYSIS OF ADHESIONS: SHX5905

## 2018-05-10 HISTORY — PX: CHOLECYSTECTOMY: SHX55

## 2018-05-10 SURGERY — LAPAROSCOPIC CHOLECYSTECTOMY
Anesthesia: General | Site: Abdomen | Wound class: Clean Contaminated

## 2018-05-10 MED ORDER — DEXAMETHASONE SODIUM PHOSPHATE 10 MG/ML IJ SOLN
INTRAMUSCULAR | Status: DC | PRN
  Administered 2018-05-10: 8 mg via INTRAVENOUS

## 2018-05-10 MED ORDER — MIDAZOLAM HCL 2 MG/2ML IJ SOLN
INTRAMUSCULAR | Status: AC
  Filled 2018-05-10: qty 2

## 2018-05-10 MED ORDER — EPHEDRINE SULFATE 50 MG/ML IJ SOLN
INTRAMUSCULAR | Status: DC | PRN
  Administered 2018-05-10: 10 mg via INTRAVENOUS

## 2018-05-10 MED ORDER — ACETAMINOPHEN NICU IV SYRINGE 10 MG/ML
INTRAVENOUS | Status: AC
  Filled 2018-05-10: qty 1

## 2018-05-10 MED ORDER — CHLORHEXIDINE GLUCONATE CLOTH 2 % EX PADS: 6.0000 | MEDICATED_PAD | Freq: Once | CUTANEOUS | Status: DC

## 2018-05-10 MED ORDER — ONDANSETRON HCL 4 MG/2ML IJ SOLN
4.0000 mg | Freq: Once | INTRAMUSCULAR | Status: AC | PRN
  Administered 2018-05-10: 4 mg via INTRAVENOUS

## 2018-05-10 MED ORDER — FENTANYL CITRATE (PF) 100 MCG/2ML IJ SOLN
INTRAMUSCULAR | Status: AC
  Filled 2018-05-10: qty 2

## 2018-05-10 MED ORDER — PROPOFOL 10 MG/ML IV BOLUS
INTRAVENOUS | Status: DC | PRN
  Administered 2018-05-10: 160 mg via INTRAVENOUS

## 2018-05-10 MED ORDER — EPHEDRINE SULFATE 50 MG/ML IJ SOLN
INTRAMUSCULAR | Status: AC
  Filled 2018-05-10: qty 1

## 2018-05-10 MED ORDER — FENTANYL CITRATE (PF) 100 MCG/2ML IJ SOLN
INTRAMUSCULAR | Status: AC
  Administered 2018-05-10: 25 ug via INTRAVENOUS
  Filled 2018-05-10: qty 2

## 2018-05-10 MED ORDER — ROCURONIUM BROMIDE 50 MG/5ML IV SOLN
INTRAVENOUS | Status: AC
  Filled 2018-05-10: qty 1

## 2018-05-10 MED ORDER — PHENYLEPHRINE HCL 10 MG/ML IJ SOLN
INTRAMUSCULAR | Status: DC | PRN
  Administered 2018-05-10 (×5): 100 ug via INTRAVENOUS

## 2018-05-10 MED ORDER — OXYCODONE HCL 5 MG PO TABS
ORAL_TABLET | ORAL | Status: AC
  Filled 2018-05-10: qty 1

## 2018-05-10 MED ORDER — FLUMAZENIL 0.5 MG/5ML IV SOLN
0.2000 mg | Freq: Once | INTRAVENOUS | Status: AC
  Administered 2018-05-10: 0.2 mg via INTRAVENOUS

## 2018-05-10 MED ORDER — ROCURONIUM BROMIDE 100 MG/10ML IV SOLN
INTRAVENOUS | Status: DC | PRN
  Administered 2018-05-10: 45 mg via INTRAVENOUS
  Administered 2018-05-10: 5 mg via INTRAVENOUS

## 2018-05-10 MED ORDER — FENTANYL CITRATE (PF) 100 MCG/2ML IJ SOLN: 25.0000 ug | INTRAMUSCULAR | Status: DC | PRN

## 2018-05-10 MED ORDER — LIDOCAINE HCL (CARDIAC) PF 100 MG/5ML IV SOSY
PREFILLED_SYRINGE | INTRAVENOUS | Status: DC | PRN
  Administered 2018-05-10: 100 mg via INTRAVENOUS

## 2018-05-10 MED ORDER — FLUMAZENIL 0.5 MG/5ML IV SOLN
INTRAVENOUS | Status: AC
  Filled 2018-05-10: qty 5

## 2018-05-10 MED ORDER — PROPOFOL 10 MG/ML IV BOLUS
INTRAVENOUS | Status: AC
  Filled 2018-05-10: qty 20

## 2018-05-10 MED ORDER — CIPROFLOXACIN IN D5W 400 MG/200ML IV SOLN
INTRAVENOUS | Status: AC
  Filled 2018-05-10: qty 200

## 2018-05-10 MED ORDER — SUCCINYLCHOLINE CHLORIDE 20 MG/ML IJ SOLN
INTRAMUSCULAR | Status: DC | PRN
  Administered 2018-05-10: 100 mg via INTRAVENOUS

## 2018-05-10 MED ORDER — LIDOCAINE HCL (PF) 2 % IJ SOLN
INTRAMUSCULAR | Status: AC
  Filled 2018-05-10: qty 10

## 2018-05-10 MED ORDER — MIDAZOLAM HCL 2 MG/2ML IJ SOLN
INTRAMUSCULAR | Status: DC | PRN
  Administered 2018-05-10: 2 mg via INTRAVENOUS

## 2018-05-10 MED ORDER — FENTANYL CITRATE (PF) 100 MCG/2ML IJ SOLN
INTRAMUSCULAR | Status: AC
  Filled 2018-05-10: qty 4

## 2018-05-10 MED ORDER — FENTANYL CITRATE (PF) 100 MCG/2ML IJ SOLN
INTRAMUSCULAR | Status: DC | PRN
  Administered 2018-05-10: 50 ug via INTRAVENOUS
  Administered 2018-05-10: 150 ug via INTRAVENOUS

## 2018-05-10 MED ORDER — OXYCODONE-ACETAMINOPHEN 5-325 MG PO TABS: 1.0000 | ORAL_TABLET | ORAL | 0 refills | Status: DC | PRN

## 2018-05-10 MED ORDER — LIDOCAINE HCL (PF) 1 % IJ SOLN
INTRAMUSCULAR | Status: AC
  Filled 2018-05-10: qty 30

## 2018-05-10 MED ORDER — SUCCINYLCHOLINE CHLORIDE 20 MG/ML IJ SOLN
INTRAMUSCULAR | Status: AC
  Filled 2018-05-10: qty 1

## 2018-05-10 MED ORDER — FENTANYL CITRATE (PF) 100 MCG/2ML IJ SOLN
25.0000 ug | INTRAMUSCULAR | Status: DC | PRN
  Administered 2018-05-10 (×4): 25 ug via INTRAVENOUS

## 2018-05-10 MED ORDER — DEXAMETHASONE SODIUM PHOSPHATE 10 MG/ML IJ SOLN
INTRAMUSCULAR | Status: AC
  Filled 2018-05-10: qty 1

## 2018-05-10 MED ORDER — ONDANSETRON HCL 4 MG/2ML IJ SOLN
INTRAMUSCULAR | Status: AC
  Administered 2018-05-10: 4 mg via INTRAVENOUS
  Filled 2018-05-10: qty 2

## 2018-05-10 MED ORDER — ACETAMINOPHEN 10 MG/ML IV SOLN
INTRAVENOUS | Status: DC | PRN
  Administered 2018-05-10: 1000 mg via INTRAVENOUS

## 2018-05-10 MED ORDER — KETOROLAC TROMETHAMINE 15 MG/ML IJ SOLN
15.0000 mg | Freq: Once | INTRAMUSCULAR | Status: AC
Start: 2018-05-10 — End: 2018-05-10
  Administered 2018-05-10: 15 mg via INTRAVENOUS

## 2018-05-10 MED ORDER — ONDANSETRON HCL 4 MG/2ML IJ SOLN: 4.0000 mg | Freq: Once | INTRAMUSCULAR | Status: DC | PRN

## 2018-05-10 MED ORDER — SUGAMMADEX SODIUM 500 MG/5ML IV SOLN
INTRAVENOUS | Status: AC
  Filled 2018-05-10: qty 5

## 2018-05-10 MED ORDER — LACTATED RINGERS IV SOLN
INTRAVENOUS | Status: DC
  Administered 2018-05-10: 09:00:00 via INTRAVENOUS

## 2018-05-10 MED ORDER — LIDOCAINE HCL 1 % IJ SOLN
INTRAMUSCULAR | Status: DC | PRN
  Administered 2018-05-10: 20 mL via INTRADERMAL

## 2018-05-10 MED ORDER — ONDANSETRON HCL 4 MG/2ML IJ SOLN
INTRAMUSCULAR | Status: AC
  Filled 2018-05-10: qty 2

## 2018-05-10 MED ORDER — ONDANSETRON HCL 4 MG/2ML IJ SOLN
INTRAMUSCULAR | Status: DC | PRN
  Administered 2018-05-10: 4 mg via INTRAVENOUS

## 2018-05-10 MED ORDER — BUPIVACAINE HCL (PF) 0.5 % IJ SOLN
INTRAMUSCULAR | Status: AC
  Filled 2018-05-10: qty 30

## 2018-05-10 MED ORDER — SUGAMMADEX SODIUM 200 MG/2ML IV SOLN
INTRAVENOUS | Status: DC | PRN
  Administered 2018-05-10: 220 mg via INTRAVENOUS

## 2018-05-10 MED ORDER — KETOROLAC TROMETHAMINE 15 MG/ML IJ SOLN
INTRAMUSCULAR | Status: AC
  Administered 2018-05-10: 15 mg via INTRAVENOUS
  Filled 2018-05-10: qty 1

## 2018-05-10 SURGICAL SUPPLY — 38 items
ADH SKN CLS APL DERMABOND .7 (GAUZE/BANDAGES/DRESSINGS) ×2
APPLIER CLIP ROT 10 11.4 M/L (STAPLE) ×3
APR CLP MED LRG 11.4X10 (STAPLE) ×2
BAG SPEC RTRVL LRG 6X4 10 (ENDOMECHANICALS) ×2
CHLORAPREP W/TINT 26ML (MISCELLANEOUS) ×3 IMPLANT
CLIP APPLIE ROT 10 11.4 M/L (STAPLE) ×2 IMPLANT
DECANTER SPIKE VIAL GLASS SM (MISCELLANEOUS) ×6 IMPLANT
DERMABOND ADVANCED (GAUZE/BANDAGES/DRESSINGS) ×1
DERMABOND ADVANCED .7 DNX12 (GAUZE/BANDAGES/DRESSINGS) ×2 IMPLANT
DRESSING SURGICEL FIBRLLR 1X2 (HEMOSTASIS) IMPLANT
DRSG SURGICEL FIBRILLAR 1X2 (HEMOSTASIS)
ELECT REM PT RETURN 9FT ADLT (ELECTROSURGICAL) ×3
ELECTRODE REM PT RTRN 9FT ADLT (ELECTROSURGICAL) ×2 IMPLANT
GLOVE BIO SURGEON STRL SZ7 (GLOVE) ×5 IMPLANT
GLOVE BIOGEL PI IND STRL 7.5 (GLOVE) ×2 IMPLANT
GLOVE BIOGEL PI INDICATOR 7.5 (GLOVE) ×3
GOWN STRL REUS W/ TWL LRG LVL3 (GOWN DISPOSABLE) ×6 IMPLANT
GOWN STRL REUS W/TWL LRG LVL3 (GOWN DISPOSABLE) ×9
GRASPER SUT TROCAR 14GX15 (MISCELLANEOUS) ×3 IMPLANT
IRRIGATION STRYKERFLOW (MISCELLANEOUS) IMPLANT
IRRIGATOR STRYKERFLOW (MISCELLANEOUS)
IV NS 1000ML (IV SOLUTION) ×3
IV NS 1000ML BAXH (IV SOLUTION) ×2 IMPLANT
KIT TURNOVER KIT A (KITS) ×3 IMPLANT
NDL INSUFFLATION 14GA 120MM (NEEDLE) ×2 IMPLANT
NEEDLE HYPO 22GX1.5 SAFETY (NEEDLE) ×3 IMPLANT
NEEDLE INSUFFLATION 14GA 120MM (NEEDLE) ×3 IMPLANT
NS IRRIG 1000ML POUR BTL (IV SOLUTION) ×3 IMPLANT
PACK LAP CHOLECYSTECTOMY (MISCELLANEOUS) ×3 IMPLANT
POUCH SPECIMEN RETRIEVAL 10MM (ENDOMECHANICALS) ×3 IMPLANT
SCISSORS METZENBAUM CVD 33 (INSTRUMENTS) ×1 IMPLANT
SLEEVE ENDOPATH XCEL 5M (ENDOMECHANICALS) ×6 IMPLANT
SUT MNCRL AB 4-0 PS2 18 (SUTURE) ×3 IMPLANT
SUT VICRYL 0 UR6 27IN ABS (SUTURE) ×3 IMPLANT
SUT VICRYL AB 3-0 FS1 BRD 27IN (SUTURE) ×3 IMPLANT
TROCAR XCEL NON-BLD 11X100MML (ENDOMECHANICALS) ×3 IMPLANT
TROCAR XCEL NON-BLD 5MMX100MML (ENDOMECHANICALS) ×3 IMPLANT
TUBING INSUFFLATION (TUBING) ×3 IMPLANT

## 2018-05-10 NOTE — Transfer of Care (Signed)
Immediate Anesthesia Transfer of Care Note  Patient: Ann Barker  Procedure(s) Performed: LAPAROSCOPIC CHOLECYSTECTOMY (N/A Abdomen) LAPAROSCOPIC LYSIS OF ADHESIONS (N/A )  Patient Location: PACU  Anesthesia Type:General  Level of Consciousness: sedated and responds to stimulation  Airway & Oxygen Therapy: Patient Spontanous Breathing and Patient connected to face mask oxygen  Post-op Assessment: Report given to RN and Post -op Vital signs reviewed and stable  Post vital signs: Reviewed and stable  Last Vitals:  Vitals Value Taken Time  BP 122/74 05/10/2018 10:37 AM  Temp    Pulse 59 05/10/2018 10:37 AM  Resp 15 05/10/2018 10:37 AM  SpO2 99 % 05/10/2018 10:37 AM    Last Pain:  Vitals:   05/10/18 0801  TempSrc: Oral  PainSc: 0-No pain         Complications: No apparent anesthesia complications

## 2018-05-10 NOTE — Op Note (Signed)
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 female who recently presented with post-prandial RUQ > epigastric abdominal pain after eating fatty foods in particular. Ultrasound suggested cholelithiasis without sonographic evidence of cholecystitis. All risks, benefits, and alternatives to above elective procedures were discussed with the patient, who elected to proceed, and informed consent was accordingly obtained at that time.   DETAILS OF PROCEDURE:  Patient was brought to the operating suite and appropriately identified. General anesthesia was administered along with peri-operative prophylactic IV antibiotics, and endotracheal intubation was performed by anesthesiologist, along with NG/OG tube for gastric decompression. In supine position, operative site was prepped and draped in usual sterile fashion, and following a brief time out, initial 5 mm incision was made in a natural skin crease just above the umbilicus (and above level of previously placed mesh for umbilical hernia repair). Fascia was then elevated, and a Verress needle was inserted and its proper position  confirmed using aspiration and saline meniscus test.  Upon insufflation of the abdominal cavity with carbon dioxide to a well-tolerated pressure of 12-15 mmHg, 5 mm peri-umbilical port followed by laparoscope were inserted and used to inspect the abdominal cavity and its contents with no injuries from insertion of the first trochar noted. Three additional trocars were inserted, one at the epigastric position (10 mm) and two along the Right costal margin (5 mm). The table was then placed in reverse Trendelenburg position with the Right side up. Filmy adhesions between the gallbladder and omentum/duodenum/transverse colon were lysed using combined blunt dissection and selective electrocautery. The apex/dome of the gallbladder was grasped with an atraumatic grasper passed through the lateral port and retracted apically over the liver. The infundibulum was also grasped and retracted, exposing Calot's triangle. The peritoneum overlying the gallbladder infundibulum was incised and dissected free of surrounding peritoneal attachments, revealing the cystic duct and cystic artery, which were clipped twice on the patient side and once on the gallbladder specimen side close to the gallbladder. The gallbladder was then dissected from its peritoneal attachments to the liver using electrocautery, and the gallbladder was placed into a laparoscopic specimen bag and removed from the abdominal cavity via the epigastric port site. Hemostasis and secure placement of clips were confirmed, and intra-peritoneal cavity was inspected with no additional findings. PMI laparoscopic fascial closure device was then used to re-approximate fascia at the 10 mm epigastric port site.  All ports were then removed under direct visualization, and abdominal cavity was desuflated. All port sites were irrigated/cleaned, additional local anesthetic was injected at each incision, 3-0 Vicryl was used to re-approximate dermis at 10 mm port site(s), and  subcuticular 4-0 Monocryl suture was used to re-approximate skin. Skin was then cleaned, dried, and sterile skin glue was applied. Patient was then safely able to be awakened, extubated, and transferred to PACU for post-operative monitoring and  care.   I was present for all aspects of the above procedure, and no operative complications were apparent.

## 2018-05-10 NOTE — Interval H&P Note (Signed)
History and Physical Interval Note:  05/10/2018 8:13 AM  Ann Barker  has presented today for surgery, with the diagnosis of symptomatic cholelithiasis  The various methods of treatment have been discussed with the patient and family. After consideration of risks, benefits and other options for treatment, the patient has consented to  Procedure(s): LAPAROSCOPIC CHOLECYSTECTOMY (N/A) as a surgical intervention .  The patient's history has been reviewed, patient examined, no change in status, stable for surgery.  I have reviewed the patient's chart and labs.  Questions were answered to the patient's satisfaction.     Vickie Epley

## 2018-05-10 NOTE — Interval H&P Note (Signed)
History and Physical Interval Note:  05/10/2018 8:12 AM  Nadea B Erlene Quan  has presented today for surgery, with the diagnosis of symptomatic cholelithiasis  The various methods of treatment have been discussed with the patient and family. After consideration of risks, benefits and other options for treatment, the patient has consented to  Procedure(s): LAPAROSCOPIC CHOLECYSTECTOMY (N/A) as a surgical intervention .  The patient's history has been reviewed, patient examined, no change in status, stable for surgery.  I have reviewed the patient's chart and labs.  Questions were answered to the patient's satisfaction.     Vickie Epley

## 2018-05-10 NOTE — Anesthesia Post-op Follow-up Note (Signed)
Anesthesia QCDR form completed.        

## 2018-05-10 NOTE — Anesthesia Preprocedure Evaluation (Addendum)
Anesthesia Evaluation  Patient identified by MRN, date of birth, ID band Patient awake    Reviewed: Allergy & Precautions, NPO status , Patient's Chart, lab work & pertinent test results  History of Anesthesia Complications (+) PONV and history of anesthetic complications  Airway Mallampati: III  TM Distance: <3 FB     Dental  (+) Caps, Chipped   Pulmonary sleep apnea ,    Pulmonary exam normal        Cardiovascular hypertension, + Peripheral Vascular Disease  Normal cardiovascular exam     Neuro/Psych  Headaches, Seizures -, Well Controlled,  negative psych ROS   GI/Hepatic hiatal hernia, GERD  ,  Endo/Other    Renal/GU Renal disease  Female GU complaint     Musculoskeletal  (+) Arthritis ,   Abdominal Normal abdominal exam  (+)   Peds negative pediatric ROS (+)  Hematology  (+) anemia ,   Anesthesia Other Findings   Reproductive/Obstetrics                            Anesthesia Physical Anesthesia Plan  ASA: III  Anesthesia Plan: General   Post-op Pain Management:    Induction: Intravenous  PONV Risk Score and Plan:   Airway Management Planned: Oral ETT  Additional Equipment:   Intra-op Plan:   Post-operative Plan: Extubation in OR  Informed Consent: I have reviewed the patients History and Physical, chart, labs and discussed the procedure including the risks, benefits and alternatives for the proposed anesthesia with the patient or authorized representative who has indicated his/her understanding and acceptance.   Dental advisory given  Plan Discussed with: CRNA and Surgeon  Anesthesia Plan Comments:         Anesthesia Quick Evaluation

## 2018-05-10 NOTE — Discharge Instructions (Addendum)
In addition to included general post-operative instructions for Laparoscopic Cholecystectomy,  Diet: Resume home heart healthy diet.   Activity: No heavy lifting >20 pounds (children, pets, laundry, garbage) or strenuous activity until follow-up, but light activity and walking are encouraged. Do not drive or drink alcohol if taking narcotic pain medications.  Wound care: 2 days after surgery (Friday, 5/31), you may shower/get incision wet with soapy water and pat dry (do not rub incisions), but no baths or submerging incision underwater until follow-up.   Medications: Resume all home medications (including once every day omeprazole). For mild to moderate pain: acetaminophen (Tylenol) or ibuprofen/naproxen (if no kidney disease). Combining Tylenol with alcohol can substantially increase your risk of causing liver disease. Narcotic pain medications, if prescribed, can be used for severe pain, though may cause nausea, constipation, and drowsiness. Do not combine Tylenol and Percocet (or similar) within a 6 hour period as Percocet (and similar) contain(s) Tylenol. If you do not need the narcotic pain medication, you do not need to fill the prescription.  Call office [phone]) at any time if any questions, worsening pain, fevers/chills, bleeding, drainage from incision site, or other concerns.  AMBULATORY SURGERY  DISCHARGE INSTRUCTIONS   1) The drugs that you were given will stay in your system until tomorrow so for the next 24 hours you should not:  A) Drive an automobile B) Make any legal decisions C) Drink any alcoholic beverage   2) You may resume regular meals tomorrow.  Today it is better to start with liquids and gradually work up to solid foods.  You may eat anything you prefer, but it is better to start with liquids, then soup and crackers, and gradually work up to solid foods.   3) Please notify your doctor immediately if you have any unusual bleeding, trouble breathing,  redness and pain at the surgery site, drainage, fever, or pain not relieved by medication.    4) Additional Instructions:        Please contact your physician with any problems or Same Day Surgery at [phone], Monday through Friday 6 am to 4 pm, or [place] at [facility] number at [phone].

## 2018-05-10 NOTE — Anesthesia Procedure Notes (Signed)
Procedure Name: Intubation Performed by: Lakota Markgraf, CRNA Pre-anesthesia Checklist: Patient identified, Patient being monitored, Timeout performed, Emergency Drugs available and Suction available Patient Re-evaluated:Patient Re-evaluated prior to induction Oxygen Delivery Method: Circle system utilized Preoxygenation: Pre-oxygenation with 100% oxygen Induction Type: IV induction Ventilation: Mask ventilation without difficulty Laryngoscope Size: Mac and 3 Grade View: Grade II Tube type: Oral Tube size: 7.0 mm Number of attempts: 1 Airway Equipment and Method: Stylet Placement Confirmation: ETT inserted through vocal cords under direct vision,  positive ETCO2 and breath sounds checked- equal and bilateral Secured at: 22 cm Tube secured with: Tape Dental Injury: Teeth and Oropharynx as per pre-operative assessment        

## 2018-05-11 ENCOUNTER — Encounter: Payer: Self-pay | Admitting: Surgery

## 2018-05-11 LAB — SURGICAL PATHOLOGY

## 2018-05-11 NOTE — Anesthesia Postprocedure Evaluation (Signed)
Anesthesia Post Note  Patient: Ann Barker  Procedure(s) Performed: LAPAROSCOPIC CHOLECYSTECTOMY (N/A Abdomen) LAPAROSCOPIC LYSIS OF ADHESIONS (N/A )  Patient location during evaluation: PACU Anesthesia Type: General Level of consciousness: awake and alert and oriented Pain management: pain level controlled Vital Signs Assessment: post-procedure vital signs reviewed and stable Respiratory status: spontaneous breathing Cardiovascular status: blood pressure returned to baseline Anesthetic complications: no     Last Vitals:  Vitals:   05/10/18 1252 05/10/18 1309  BP: 117/79 128/71  Pulse: 87 66  Resp: 16 16  Temp: (!) 36.1 C (!) 36.1 C  SpO2: 96% 97%    Last Pain:  Vitals:   05/10/18 1309  TempSrc: Temporal  PainSc: Asleep                 Raven Furnas

## 2018-05-25 ENCOUNTER — Encounter: Payer: Self-pay | Admitting: Surgery

## 2018-05-25 ENCOUNTER — Ambulatory Visit (INDEPENDENT_AMBULATORY_CARE_PROVIDER_SITE_OTHER): Payer: 59 | Admitting: Surgery

## 2018-05-25 VITALS — BP 141/90 | HR 73 | Temp 97.5°F | Ht 64.0 in | Wt 232.6 lb

## 2018-05-25 DIAGNOSIS — Z4889 Encounter for other specified surgical aftercare: Secondary | ICD-10-CM

## 2018-05-25 DIAGNOSIS — K8020 Calculus of gallbladder without cholecystitis without obstruction: Secondary | ICD-10-CM

## 2018-05-25 NOTE — Patient Instructions (Signed)
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
## 2018-05-30 ENCOUNTER — Ambulatory Visit (INDEPENDENT_AMBULATORY_CARE_PROVIDER_SITE_OTHER): Payer: Medicare HMO | Admitting: Internal Medicine

## 2018-05-30 ENCOUNTER — Ambulatory Visit (INDEPENDENT_AMBULATORY_CARE_PROVIDER_SITE_OTHER)
Admission: RE | Admit: 2018-05-30 | Discharge: 2018-05-30 | Disposition: A | Discharge status: Home / Self Care | Payer: Medicare HMO | Source: Ambulatory Visit | Attending: Internal Medicine | Admitting: Internal Medicine

## 2018-05-30 ENCOUNTER — Encounter: Payer: Self-pay | Admitting: Internal Medicine

## 2018-05-30 VITALS — BP 130/78 | HR 77 | Temp 98.1°F | Wt 234.0 lb

## 2018-05-30 DIAGNOSIS — M542 Cervicalgia: Secondary | ICD-10-CM

## 2018-05-30 DIAGNOSIS — M25511 Pain in right shoulder: Secondary | ICD-10-CM | POA: Diagnosis not present

## 2018-05-30 NOTE — Progress Notes (Signed)
Subjective:    Patient ID: Ann Barker Roles, female    DOB: December 31, 1950, 67 y.o.   MRN: [id number]  HPI  Pt presents to the clinic today with c/o right shoulder pain. She reports this started 2 months ago. She describes the pain as sore and achy, but it can be sharp and stabbing. The pain is worse with pressure or touching the shoulder. She denies numbness, tingling or weakness of the right arm. She reports she fell a few months ago, landed on the right shoulder. She has tried Ibuprofen with minimal relief.  Review of Systems      Past Medical History:  Diagnosis Date  . Acid reflux   . Anemia   . Arthritis   . Cancer ([place]) 1993   SKIN CANCER  . Complication of anesthesia    HARD TO WAKE UP  . Concussion 01/2017  . Dizziness   . Family history of adverse reaction to anesthesia    PT UNSURE OF FAMILY HISTORY  . Gallstones   . Hematuria    microscopic  . History of hiatal hernia   . History of kidney stones    H/O  . HLD (hyperlipidemia)   . Hoarseness   . Hypertension   . Lower extremity edema    pt states this is chronic and has been told she has lymph edema-wears compression stockings  . Lower leg edema   . Over weight   . PONV (postoperative nausea and vomiting)   . Renal cyst   . Seizures (HCC)    AS A CHILD AND THEN WITH 1 PREGNANCY BUT NONE SINCE  . Sleep apnea    NO CPAP  . Symptomatic cholelithiasis 04/14/2018  . Urge incontinence   . UTI (lower urinary tract infection)   . Vitamin D deficiency     Current Outpatient Medications  Medication Sig Dispense Refill  . amLODipine (NORVASC) 10 MG tablet Take 1 tablet (10 mg total) by mouth daily. (Patient taking differently: Take 10 mg by mouth at bedtime. ) 90 tablet 3  . BIOTIN PO Take 1 capsule by mouth daily.    . cholecalciferol (VITAMIN D) 1000 units tablet Take 1,000 Units by mouth daily.    . folic acid (FOLVITE) 660 MCG tablet Take 800 mcg by mouth daily.    Marland Kitchen ibuprofen (MOTRIN IB) 200 MG tablet Take 2  tablets (400 mg total) by mouth every 8 (eight) hours as needed (for pain, with food.).    Marland Kitchen Multiple Vitamin (MULTIVITAMIN) capsule Take 1 capsule by mouth daily.    . mupirocin ointment (BACTROBAN) 2 % Apply 1 application topically 2 (two) times daily. 22 g 0  . omeprazole (PRILOSEC) 40 MG capsule Take 1 capsule (40 mg total) by mouth daily. (Patient taking differently: Take 40 mg by mouth at bedtime. ) 30 capsule 1  . ondansetron (ZOFRAN ODT) 4 MG disintegrating tablet Take 1 tablet (4 mg total) by mouth every 8 (eight) hours as needed for nausea or vomiting. 20 tablet 0  . oxyCODONE-acetaminophen (PERCOCET/ROXICET) 5-325 MG tablet Take 1-2 tablets by mouth every 4 (four) hours as needed for severe pain. 30 tablet 0   No current facility-administered medications for this visit.     Allergies  Allergen Reactions  . Morphine Other (See Comments)    Unknown  . Clindamycin/Lincomycin Diarrhea  . Methylprednisolone Swelling  . Codeine Palpitations and Other (See Comments)    unknown  . Morphine And Related Palpitations  . Penicillins Rash and Other (  See Comments)    unknown    Family History  Problem Relation Age of Onset  . Cancer Paternal Aunt   . Stroke Paternal Uncle   . Prostate cancer Neg Hx   . Kidney disease Neg Hx   . Bladder Cancer Neg Hx     Social History   Socioeconomic History  . Marital status: Married    Spouse name: Not on file  . Number of children: Not on file  . Years of education: Not on file  . Highest education level: Not on file  Occupational History  . Not on file  Social Needs  . Financial resource strain: Not on file  . Food insecurity:    Worry: Not on file    Inability: Not on file  . Transportation needs:    Medical: Not on file    Non-medical: Not on file  Tobacco Use  . Smoking status: Never Smoker  . Smokeless tobacco: Never Used  Substance and Sexual Activity  . Alcohol use: No    Alcohol/week: 0.0 oz  . Drug use: No  . Sexual  activity: Not on file  Lifestyle  . Physical activity:    Days per week: Not on file    Minutes per session: Not on file  . Stress: Not on file  Relationships  . Social connections:    Talks on phone: Not on file    Gets together: Not on file    Attends religious service: Not on file    Active member of club or organization: Not on file    Attends meetings of clubs or organizations: Not on file    Relationship status: Not on file  . Intimate partner violence:    Fear of current or ex partner: Not on file    Emotionally abused: Not on file    Physically abused: Not on file    Forced sexual activity: Not on file  Other Topics Concern  . Not on file  Social History Narrative  . Not on file     Constitutional: Denies fever, malaise, fatigue, headache or abrupt weight changes.  Musculoskeletal: Pt reports right shoulder pain. Denies  difficulty with gait, muscle pain or joint swelling.    No other specific complaints in a complete review of systems (except as listed in HPI above).  Objective:   Physical Exam  BP 130/78   Pulse 77   Temp 98.1 F (36.7 C) (Oral)   Wt 234 lb (106.1 kg)   SpO2 97%   BMI 40.17 kg/m  Wt Readings from Last 3 Encounters:  05/30/18 234 lb (106.1 kg)  05/25/18 232 lb 9.6 oz (105.5 kg)  05/10/18 236 lb 6 oz (107.2 kg)    General: Appears her stated age, obese in NAD. Cardiovascular: Right radial pulse 2+ Musculoskeletal: Normal flexion and rotation to the left of the cervical spine. Decreased extension and rotation to the right. Pain with palpation over the cervical spine, right paracervical muscles and right trapezius. Normal internal rotation of the right shoulder. Pain with external rotation of the right shoulder. Pain with palpation of the right AC joint, subacromial bursa, and anterior proximal biceps tendon. Positive drop can on the right. Strength 5/5 BUE. Neurological: Alert and oriented. Sensation intact to RUE.   BMET    Component  Value Date/Time   NA 145 04/14/2018 1153   NA 145 01/08/2015 0315   K 3.6 04/14/2018 1153   K 4.0 01/08/2015 0315   CL 110 04/14/2018 1153  CL 109 (H) 01/08/2015 0315   CO2 29 04/14/2018 1153   CO2 29 01/08/2015 0315   GLUCOSE 97 04/14/2018 1153   GLUCOSE 97 01/08/2015 0315   BUN 17 04/14/2018 1153   BUN 23 (H) 01/08/2015 0315   CREATININE 0.92 04/14/2018 1153   CREATININE 0.89 03/05/2016 1634   CALCIUM 8.9 04/14/2018 1153   CALCIUM 8.7 01/08/2015 0315   GFRNONAA 56 (L) 04/10/2018 0553   GFRNONAA 51 (L) 01/08/2015 0315   GFRAA >60 04/10/2018 0553   GFRAA >60 01/08/2015 0315    Lipid Panel     Component Value Date/Time   CHOL 190 10/17/2017 1058   TRIG 100.0 10/17/2017 1058   HDL 71.60 10/17/2017 1058   CHOLHDL 3 10/17/2017 1058   VLDL 20.0 10/17/2017 1058   LDLCALC 98 10/17/2017 1058    CBC    Component Value Date/Time   WBC 6.3 04/14/2018 1153   RBC 4.11 04/14/2018 1153   HGB 11.8 (L) 04/14/2018 1153   HGB 12.5 01/08/2015 0315   HCT 35.8 (L) 04/14/2018 1153   HCT 39.7 01/08/2015 0315   PLT 248.0 04/14/2018 1153   PLT 218 01/08/2015 0315   MCV 87.0 04/14/2018 1153   MCV 88 01/08/2015 0315   MCH 28.7 04/10/2018 0553   MCHC 33.0 04/14/2018 1153   RDW 13.8 04/14/2018 1153   RDW 14.0 01/08/2015 0315   LYMPHSABS 1.8 04/14/2018 1153   LYMPHSABS 3.6 01/08/2015 0315   MONOABS 0.6 04/14/2018 1153   MONOABS 1.0 (H) 01/08/2015 0315   EOSABS 0.2 04/14/2018 1153   EOSABS 0.2 01/08/2015 0315   BASOSABS 0.1 04/14/2018 1153   BASOSABS 0.1 01/08/2015 0315    Hgb A1C Lab Results  Component Value Date   HGBA1C 5.7 10/17/2017            Assessment & Plan:   Acute Neck Pain, Right Shoulder Pain:  Xray cervical spine and right shoulder today Advised her to continue daily Aleve Heat may be helpful Offered RX for Tramadol, she declines Consider PT vs MRI if symptoms persist or worsen  Will follow up after xrays, return precautions discussed Webb Silversmith,  NP

## 2018-05-30 NOTE — Patient Instructions (Signed)
Shoulder Exercises Ask your health care provider which exercises are safe for you. Do exercises exactly as told by your health care provider and adjust them as directed. It is normal to feel mild stretching, pulling, tightness, or discomfort as you do these exercises, but you should stop right away if you feel sudden pain or your pain gets worse.Do not begin these exercises until told by your health care provider. RANGE OF MOTION EXERCISES These exercises warm up your muscles and joints and improve the movement and flexibility of your shoulder. These exercises also help to relieve pain, numbness, and tingling. These exercises involve stretching your injured shoulder directly. Exercise A: Pendulum  1. Stand near a wall or a surface that you can hold onto for balance. 2. Bend at the waist and let your left / right arm hang straight down. Use your other arm to support you. Keep your back straight and do not lock your knees. 3. Relax your left / right arm and shoulder muscles, and move your hips and your trunk so your left / right arm swings freely. Your arm should swing because of the motion of your body, not because you are using your arm or shoulder muscles. 4. Keep moving your body so your arm swings in the following directions, as told by your health care provider: ? Side to side. ? Forward and backward. ? In clockwise and counterclockwise circles. 5. Continue each motion for __________ seconds, or for as long as told by your health care provider. 6. Slowly return to the starting position. Repeat __________ times. Complete this exercise __________ times a day. Exercise B:Flexion, Standing  1. Stand and hold a broomstick, a cane, or a similar object. Place your hands a little more than shoulder-width apart on the object. Your left / right hand should be palm-up, and your other hand should be palm-down. 2. Keep your elbow straight and keep your shoulder muscles relaxed. Push the stick down with  your healthy arm to raise your left / right arm in front of your body, and then over your head until you feel a stretch in your shoulder. ? Avoid shrugging your shoulder while you raise your arm. Keep your shoulder blade tucked down toward the middle of your back. 3. Hold for __________ seconds. 4. Slowly return to the starting position. Repeat __________ times. Complete this exercise __________ times a day. Exercise C: Abduction, Standing 1. Stand and hold a broomstick, a cane, or a similar object. Place your hands a little more than shoulder-width apart on the object. Your left / right hand should be palm-up, and your other hand should be palm-down. 2. While keeping your elbow straight and your shoulder muscles relaxed, push the stick across your body toward your left / right side. Raise your left / right arm to the side of your body and then over your head until you feel a stretch in your shoulder. ? Do not raise your arm above shoulder height, unless your health care provider tells you to do that. ? Avoid shrugging your shoulder while you raise your arm. Keep your shoulder blade tucked down toward the middle of your back. 3. Hold for __________ seconds. 4. Slowly return to the starting position. Repeat __________ times. Complete this exercise __________ times a day. Exercise D:Internal Rotation  1. Place your left / right hand behind your back, palm-up. 2. Use your other hand to dangle an exercise band, a towel, or a similar object over your shoulder. Grasp the band with   your left / right hand so you are holding onto both ends. 3. Gently pull up on the band until you feel a stretch in the front of your left / right shoulder. ? Avoid shrugging your shoulder while you raise your arm. Keep your shoulder blade tucked down toward the middle of your back. 4. Hold for __________ seconds. 5. Release the stretch by letting go of the band and lowering your hands. Repeat __________ times. Complete  this exercise __________ times a day. STRETCHING EXERCISES These exercises warm up your muscles and joints and improve the movement and flexibility of your shoulder. These exercises also help to relieve pain, numbness, and tingling. These exercises are done using your healthy shoulder to help stretch the muscles of your injured shoulder. Exercise E: Corner Stretch (External Rotation and Abduction)  1. Stand in a doorway with one of your feet slightly in front of the other. This is called a staggered stance. If you cannot reach your forearms to the door frame, stand facing a corner of a room. 2. Choose one of the following positions as told by your health care provider: ? Place your hands and forearms on the door frame above your head. ? Place your hands and forearms on the door frame at the height of your head. ? Place your hands on the door frame at the height of your elbows. 3. Slowly move your weight onto your front foot until you feel a stretch across your chest and in the front of your shoulders. Keep your head and chest upright and keep your abdominal muscles tight. 4. Hold for __________ seconds. 5. To release the stretch, shift your weight to your back foot. Repeat __________ times. Complete this stretch __________ times a day. Exercise F:Extension, Standing 1. Stand and hold a broomstick, a cane, or a similar object behind your back. ? Your hands should be a little wider than shoulder-width apart. ? Your palms should face away from your back. 2. Keeping your elbows straight and keeping your shoulder muscles relaxed, move the stick away from your body until you feel a stretch in your shoulder. ? Avoid shrugging your shoulders while you move the stick. Keep your shoulder blade tucked down toward the middle of your back. 3. Hold for __________ seconds. 4. Slowly return to the starting position. Repeat __________ times. Complete this exercise __________ times a day. STRENGTHENING  EXERCISES These exercises build strength and endurance in your shoulder. Endurance is the ability to use your muscles for a long time, even after they get tired. Exercise G:External Rotation  1. Sit in a stable chair without armrests. 2. Secure an exercise band at elbow height on your left / right side. 3. Place a soft object, such as a folded towel or a small pillow, between your left / right upper arm and your body to move your elbow a few inches away (about 10 cm) from your side. 4. Hold the end of the band so it is tight and there is no slack. 5. Keeping your elbow pressed against the soft object, move your left / right forearm out, away from your abdomen. Keep your body steady so only your forearm moves. 6. Hold for __________ seconds. 7. Slowly return to the starting position. Repeat __________ times. Complete this exercise __________ times a day. Exercise H:Shoulder Abduction  1. Sit in a stable chair without armrests, or stand. 2. Hold a __________ weight in your left / right hand, or hold an exercise band with both hands.   3. Start with your arms straight down and your left / right palm facing in, toward your body. 4. Slowly lift your left / right hand out to your side. Do not lift your hand above shoulder height unless your health care provider tells you that this is safe. ? Keep your arms straight. ? Avoid shrugging your shoulder while you do this movement. Keep your shoulder blade tucked down toward the middle of your back. 5. Hold for __________ seconds. 6. Slowly lower your arm, and return to the starting position. Repeat __________ times. Complete this exercise __________ times a day. Exercise I:Shoulder Extension 1. Sit in a stable chair without armrests, or stand. 2. Secure an exercise band to a stable object in front of you where it is at shoulder height. 3. Hold one end of the exercise band in each hand. Your palms should face each other. 4. Straighten your elbows and  lift your hands up to shoulder height. 5. Step back, away from the secured end of the exercise band, until the band is tight and there is no slack. 6. Squeeze your shoulder blades together as you pull your hands down to the sides of your thighs. Stop when your hands are straight down by your sides. Do not let your hands go behind your body. 7. Hold for __________ seconds. 8. Slowly return to the starting position. Repeat __________ times. Complete this exercise __________ times a day. Exercise J:Standing Shoulder Row 1. Sit in a stable chair without armrests, or stand. 2. Secure an exercise band to a stable object in front of you so it is at waist height. 3. Hold one end of the exercise band in each hand. Your palms should be in a thumbs-up position. 4. Bend each of your elbows to an "L" shape (about 90 degrees) and keep your upper arms at your sides. 5. Step back until the band is tight and there is no slack. 6. Slowly pull your elbows back behind you. 7. Hold for __________ seconds. 8. Slowly return to the starting position. Repeat __________ times. Complete this exercise __________ times a day. Exercise K:Shoulder Press-Ups  1. Sit in a stable chair that has armrests. Sit upright, with your feet flat on the floor. 2. Put your hands on the armrests so your elbows are bent and your fingers are pointing forward. Your hands should be about even with the sides of your body. 3. Push down on the armrests and use your arms to lift yourself off of the chair. Straighten your elbows and lift yourself up as much as you comfortably can. ? Move your shoulder blades down, and avoid letting your shoulders move up toward your ears. ? Keep your feet on the ground. As you get stronger, your feet should support less of your body weight as you lift yourself up. 4. Hold for __________ seconds. 5. Slowly lower yourself back into the chair. Repeat __________ times. Complete this exercise __________ times a  day. Exercise L: Wall Push-Ups  1. Stand so you are facing a stable wall. Your feet should be about one arm-length away from the wall. 2. Lean forward and place your palms on the wall at shoulder height. 3. Keep your feet flat on the floor as you bend your elbows and lean forward toward the wall. 4. Hold for __________ seconds. 5. Straighten your elbows to push yourself back to the starting position. Repeat __________ times. Complete this exercise __________ times a day. This information is not intended to replace advice   given to you by your health care provider. Make sure you discuss any questions you have with your health care provider. Document Released: 10/13/2005 Document Revised: 08/23/2016 Document Reviewed: 08/10/2015 Elsevier Interactive Patient Education  2018 [employer]  

## 2018-05-31 ENCOUNTER — Other Ambulatory Visit: Payer: Self-pay | Admitting: Internal Medicine

## 2018-05-31 DIAGNOSIS — M25511 Pain in right shoulder: Secondary | ICD-10-CM

## 2018-06-09 DIAGNOSIS — M25551 Pain in right hip: Secondary | ICD-10-CM | POA: Diagnosis not present

## 2018-07-13 ENCOUNTER — Other Ambulatory Visit: Payer: Self-pay | Admitting: Surgery

## 2018-07-19 ENCOUNTER — Telehealth: Payer: Self-pay | Admitting: Primary Care

## 2018-07-19 DIAGNOSIS — K219 Gastro-esophageal reflux disease without esophagitis: Secondary | ICD-10-CM

## 2018-07-19 NOTE — Telephone Encounter (Signed)
Copied from [place] [id number]. Topic: Quick Communication - Rx Refill/Question >> Jul 19, 2018  4:57 PM Tye [place] wrote: Pt does not see the physician who originally prescribed the medication  Medication: omeprazole (PRILOSEC) 40 MG capsule [[id number]]   Has the patient contacted their pharmacy? Yes.   (Agent: If no, request that the patient contact the pharmacy for the refill.) (Agent: If yes, when and what did the pharmacy advise?)  Preferred Pharmacy (with phone number or street name): [facility]  Agent: Please be advised that RX refills may take up to 3 business days. We ask that you follow-up with your pharmacy.

## 2018-07-19 NOTE — Telephone Encounter (Signed)
Refill of prilosec by historical provider  LRF 04/17/18  #30  1 refill  LOV 6/18 19 K.Carlis Abbott

## 2018-07-20 MED ORDER — OMEPRAZOLE 40 MG PO CPDR: 40.0000 mg | DELAYED_RELEASE_CAPSULE | Freq: Every day | ORAL | 0 refills | Status: DC

## 2018-07-20 NOTE — Telephone Encounter (Signed)
Noted, refill sent to pharmacy. 

## 2018-07-23 DIAGNOSIS — S4982XA Other specified injuries of left shoulder and upper arm, initial encounter: Secondary | ICD-10-CM | POA: Diagnosis not present

## 2018-07-23 DIAGNOSIS — Z743 Need for continuous supervision: Secondary | ICD-10-CM | POA: Diagnosis not present

## 2018-07-25 ENCOUNTER — Telehealth: Payer: Self-pay

## 2018-07-25 DIAGNOSIS — M25571 Pain in right ankle and joints of right foot: Secondary | ICD-10-CM

## 2018-07-25 DIAGNOSIS — M25572 Pain in left ankle and joints of left foot: Secondary | ICD-10-CM

## 2018-07-25 NOTE — Telephone Encounter (Signed)
Noted. Will place referral but we'll need to work on getting records from [place]. Please have patient come in and sign for record release.
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
## 2018-07-26 NOTE — Telephone Encounter (Signed)
Tried to call patient but voice mail box is full. 

## 2018-07-26 NOTE — Telephone Encounter (Signed)
Pt notified as instructed and pt voiced understanding. Pt will get transportation and come by [facility] to sign release.

## 2018-07-28 ENCOUNTER — Other Ambulatory Visit: Payer: Self-pay

## 2018-07-28 ENCOUNTER — Ambulatory Visit: Payer: Self-pay | Admitting: Primary Care

## 2018-07-28 ENCOUNTER — Emergency Department (HOSPITAL_COMMUNITY): Payer: Medicare HMO

## 2018-07-28 ENCOUNTER — Emergency Department (HOSPITAL_COMMUNITY)
Admission: EM | Admit: 2018-07-28 | Discharge: 2018-07-28 | Disposition: A | Discharge status: Home / Self Care | Payer: Medicare HMO | Attending: Emergency Medicine | Admitting: Emergency Medicine

## 2018-07-28 ENCOUNTER — Encounter (HOSPITAL_COMMUNITY): Payer: Self-pay | Admitting: Emergency Medicine

## 2018-07-28 DIAGNOSIS — M7989 Other specified soft tissue disorders: Secondary | ICD-10-CM | POA: Diagnosis not present

## 2018-07-28 DIAGNOSIS — Z85828 Personal history of other malignant neoplasm of skin: Secondary | ICD-10-CM | POA: Insufficient documentation

## 2018-07-28 DIAGNOSIS — S8265XA Nondisplaced fracture of lateral malleolus of left fibula, initial encounter for closed fracture: Secondary | ICD-10-CM | POA: Diagnosis not present

## 2018-07-28 DIAGNOSIS — I10 Essential (primary) hypertension: Secondary | ICD-10-CM | POA: Insufficient documentation

## 2018-07-28 DIAGNOSIS — S8251XA Displaced fracture of medial malleolus of right tibia, initial encounter for closed fracture: Secondary | ICD-10-CM | POA: Diagnosis not present

## 2018-07-28 DIAGNOSIS — S8262XA Displaced fracture of lateral malleolus of left fibula, initial encounter for closed fracture: Secondary | ICD-10-CM | POA: Insufficient documentation

## 2018-07-28 DIAGNOSIS — S8992XA Unspecified injury of left lower leg, initial encounter: Secondary | ICD-10-CM | POA: Diagnosis not present

## 2018-07-28 DIAGNOSIS — S99911A Unspecified injury of right ankle, initial encounter: Secondary | ICD-10-CM | POA: Diagnosis not present

## 2018-07-28 DIAGNOSIS — Y998 Other external cause status: Secondary | ICD-10-CM | POA: Diagnosis not present

## 2018-07-28 DIAGNOSIS — Y92410 Unspecified street and highway as the place of occurrence of the external cause: Secondary | ICD-10-CM | POA: Insufficient documentation

## 2018-07-28 DIAGNOSIS — M25562 Pain in left knee: Secondary | ICD-10-CM | POA: Diagnosis not present

## 2018-07-28 DIAGNOSIS — Y9389 Activity, other specified: Secondary | ICD-10-CM | POA: Diagnosis not present

## 2018-07-28 DIAGNOSIS — S99922A Unspecified injury of left foot, initial encounter: Secondary | ICD-10-CM | POA: Diagnosis present

## 2018-07-28 DIAGNOSIS — Z9049 Acquired absence of other specified parts of digestive tract: Secondary | ICD-10-CM | POA: Insufficient documentation

## 2018-07-28 DIAGNOSIS — M25571 Pain in right ankle and joints of right foot: Secondary | ICD-10-CM | POA: Diagnosis not present

## 2018-07-28 DIAGNOSIS — Z79899 Other long term (current) drug therapy: Secondary | ICD-10-CM | POA: Diagnosis not present

## 2018-07-28 NOTE — Telephone Encounter (Signed)
She is going to ER right now.

## 2018-07-28 NOTE — ED Notes (Signed)
Bed: WTR5 Expected date:  Expected time:  Means of arrival:  Comments: 
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
## 2018-07-28 NOTE — Progress Notes (Signed)
CSW spoke with EDP who stated pt had met with ortho tech and desire to return home.  EPD will place a consult for CM with an order for face-to-face and an order RN/Aide/PT/OT and Social Work.  Please reconsult if future social work needs arise.  CSW signing off, as social work intervention is no longer needed.  Ann Guild. Arisbel Maione, LCSW, LCAS, CSI Clinical Social Worker Ph: [phone]

## 2018-07-28 NOTE — Discharge Instructions (Addendum)
Stay off your feet as much as possible. Use the walker when you have to get up. Keep your feet elevated as often as possible. Follow up with the orthopedic doctor. Call to schedule an appointment.
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
## 2018-07-28 NOTE — Telephone Encounter (Signed)
I don't see where she's at any local ED as of yet. A referral was placed to orthopedics on 07/25/18. Will watch out for ED notes.  Can we check on patient on Monday August 19th?

## 2018-07-28 NOTE — Telephone Encounter (Signed)
Lucynda RN also noted;  Pt of Allie Bossier that is going to the ED at [facility].  I agreed she should go.  See triage notes.   Routing comment

## 2018-07-31 ENCOUNTER — Ambulatory Visit (INDEPENDENT_AMBULATORY_CARE_PROVIDER_SITE_OTHER): Payer: Medicare HMO | Admitting: Orthopaedic Surgery

## 2018-07-31 ENCOUNTER — Encounter (INDEPENDENT_AMBULATORY_CARE_PROVIDER_SITE_OTHER): Payer: Self-pay | Admitting: Orthopaedic Surgery

## 2018-07-31 DIAGNOSIS — S8265XA Nondisplaced fracture of lateral malleolus of left fibula, initial encounter for closed fracture: Secondary | ICD-10-CM | POA: Diagnosis not present

## 2018-07-31 DIAGNOSIS — S8254XA Nondisplaced fracture of medial malleolus of right tibia, initial encounter for closed fracture: Secondary | ICD-10-CM
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
## 2018-08-03 ENCOUNTER — Ambulatory Visit: Payer: Self-pay
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
## 2018-08-03 NOTE — Telephone Encounter (Signed)
Magda Paganini RN also noted;     08/03/18 2:06 PM  Pt. Reports she will discuss with her orthopedic doctor. Thanks

## 2018-08-03 NOTE — Telephone Encounter (Signed)
Noted  

## 2018-08-04 ENCOUNTER — Telehealth (INDEPENDENT_AMBULATORY_CARE_PROVIDER_SITE_OTHER): Payer: Self-pay | Admitting: Orthopaedic Surgery
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
## 2018-08-04 NOTE — Telephone Encounter (Signed)
That is fine to address this but she will have to keep the appointment she has due to schedule being completely booked

## 2018-08-04 NOTE — Telephone Encounter (Signed)
Called patient advised patient advised her of message below, Patient will wait until wed for follow up visit.

## 2018-08-09 ENCOUNTER — Ambulatory Visit (INDEPENDENT_AMBULATORY_CARE_PROVIDER_SITE_OTHER): Payer: Medicare HMO | Admitting: Orthopaedic Surgery

## 2018-08-09 ENCOUNTER — Ambulatory Visit (INDEPENDENT_AMBULATORY_CARE_PROVIDER_SITE_OTHER): Payer: Medicare HMO

## 2018-08-09 ENCOUNTER — Encounter (INDEPENDENT_AMBULATORY_CARE_PROVIDER_SITE_OTHER): Payer: Self-pay | Admitting: Orthopaedic Surgery

## 2018-08-09 ENCOUNTER — Ambulatory Visit (INDEPENDENT_AMBULATORY_CARE_PROVIDER_SITE_OTHER): Payer: Self-pay

## 2018-08-09 DIAGNOSIS — S8265XD Nondisplaced fracture of lateral malleolus of left fibula, subsequent encounter for closed fracture with routine healing: Secondary | ICD-10-CM | POA: Diagnosis not present

## 2018-08-09 DIAGNOSIS — S8254XD Nondisplaced fracture of medial malleolus of right tibia, subsequent encounter for closed fracture with routine healing: Secondary | ICD-10-CM
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
## 2018-08-25 ENCOUNTER — Other Ambulatory Visit: Payer: Self-pay | Admitting: Primary Care

## 2018-08-25 DIAGNOSIS — I10 Essential (primary) hypertension: Secondary | ICD-10-CM

## 2018-08-30 ENCOUNTER — Encounter (INDEPENDENT_AMBULATORY_CARE_PROVIDER_SITE_OTHER): Payer: Self-pay | Admitting: Orthopaedic Surgery

## 2018-08-30 ENCOUNTER — Ambulatory Visit (INDEPENDENT_AMBULATORY_CARE_PROVIDER_SITE_OTHER): Payer: Medicare HMO

## 2018-08-30 ENCOUNTER — Ambulatory Visit (INDEPENDENT_AMBULATORY_CARE_PROVIDER_SITE_OTHER): Payer: Medicare HMO | Admitting: Physician Assistant

## 2018-08-30 ENCOUNTER — Ambulatory Visit (INDEPENDENT_AMBULATORY_CARE_PROVIDER_SITE_OTHER): Payer: Self-pay

## 2018-08-30 DIAGNOSIS — S8265XA Nondisplaced fracture of lateral malleolus of left fibula, initial encounter for closed fracture: Secondary | ICD-10-CM

## 2018-08-30 DIAGNOSIS — S8254XD Nondisplaced fracture of medial malleolus of right tibia, subsequent encounter for closed fracture with routine healing: Secondary | ICD-10-CM
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
## 2018-09-27 ENCOUNTER — Ambulatory Visit (INDEPENDENT_AMBULATORY_CARE_PROVIDER_SITE_OTHER): Payer: Self-pay

## 2018-09-27 ENCOUNTER — Ambulatory Visit (INDEPENDENT_AMBULATORY_CARE_PROVIDER_SITE_OTHER): Payer: Medicare HMO | Admitting: Orthopaedic Surgery

## 2018-09-27 ENCOUNTER — Encounter (INDEPENDENT_AMBULATORY_CARE_PROVIDER_SITE_OTHER): Payer: Self-pay | Admitting: Orthopaedic Surgery

## 2018-09-27 ENCOUNTER — Ambulatory Visit (INDEPENDENT_AMBULATORY_CARE_PROVIDER_SITE_OTHER): Payer: Medicare HMO

## 2018-09-27 DIAGNOSIS — S8254XD Nondisplaced fracture of medial malleolus of right tibia, subsequent encounter for closed fracture with routine healing: Secondary | ICD-10-CM

## 2018-09-27 DIAGNOSIS — S8265XA Nondisplaced fracture of lateral malleolus of left fibula, initial encounter for closed fracture: Secondary | ICD-10-CM

## 2018-09-27 DIAGNOSIS — M25571 Pain in right ankle and joints of right foot: Secondary | ICD-10-CM
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
## 2018-10-12 ENCOUNTER — Other Ambulatory Visit: Payer: Self-pay | Admitting: Primary Care

## 2018-10-12 DIAGNOSIS — K219 Gastro-esophageal reflux disease without esophagitis: Secondary | ICD-10-CM

## 2018-10-23 ENCOUNTER — Telehealth: Payer: Self-pay | Admitting: Primary Care

## 2018-10-23 NOTE — Telephone Encounter (Signed)
Needs office visit with me to discuss or can have her surgeon provide.
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
## 2018-10-24 ENCOUNTER — Telehealth (INDEPENDENT_AMBULATORY_CARE_PROVIDER_SITE_OTHER): Payer: Self-pay | Admitting: Orthopaedic Surgery

## 2018-10-24 ENCOUNTER — Encounter (INDEPENDENT_AMBULATORY_CARE_PROVIDER_SITE_OTHER): Payer: Self-pay

## 2018-10-24 NOTE — Telephone Encounter (Signed)
Please advise 

## 2018-10-24 NOTE — Telephone Encounter (Signed)
Spoken to patient and she stated that she will call the surgeon to see if they will write her the letter. Patient need this by tomorrow morning. Unfortunately, Ann Barker schedule is full today. Did offer tomorrow even though

## 2018-10-24 NOTE — Telephone Encounter (Signed)
Patient called stating she has jury duty tomorrow and asked if she can get a note excusing her from jury duty. Patient said she is wearing ankle bracelets and do not feel like she can walk or sit that long. The number to contact patient is [phone]

## 2018-10-24 NOTE — Telephone Encounter (Signed)
Patient aware note ready for her at front desk

## 2018-10-24 NOTE — Telephone Encounter (Signed)
It is certainly reasonable to keep her out of jury duty and to give her a note for this since we are currently actively treating her for an ankle fracture.

## 2018-11-08 ENCOUNTER — Other Ambulatory Visit (INDEPENDENT_AMBULATORY_CARE_PROVIDER_SITE_OTHER): Payer: Self-pay

## 2018-11-08 ENCOUNTER — Encounter (INDEPENDENT_AMBULATORY_CARE_PROVIDER_SITE_OTHER): Payer: Self-pay | Admitting: Orthopaedic Surgery

## 2018-11-08 ENCOUNTER — Ambulatory Visit (INDEPENDENT_AMBULATORY_CARE_PROVIDER_SITE_OTHER): Payer: Medicare HMO | Admitting: Orthopaedic Surgery

## 2018-11-08 ENCOUNTER — Ambulatory Visit (INDEPENDENT_AMBULATORY_CARE_PROVIDER_SITE_OTHER): Payer: Medicare HMO

## 2018-11-08 DIAGNOSIS — S8265XA Nondisplaced fracture of lateral malleolus of left fibula, initial encounter for closed fracture: Secondary | ICD-10-CM | POA: Diagnosis not present

## 2018-11-08 DIAGNOSIS — S8254XD Nondisplaced fracture of medial malleolus of right tibia, subsequent encounter for closed fracture with routine healing: Secondary | ICD-10-CM
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
## 2018-11-14 ENCOUNTER — Ambulatory Visit: Payer: Medicare HMO | Attending: Physician Assistant | Admitting: Physical Therapy

## 2018-11-14 ENCOUNTER — Other Ambulatory Visit: Payer: Self-pay

## 2018-11-14 DIAGNOSIS — M25571 Pain in right ankle and joints of right foot: Secondary | ICD-10-CM | POA: Diagnosis not present

## 2018-11-14 DIAGNOSIS — M25672 Stiffness of left ankle, not elsewhere classified: Secondary | ICD-10-CM | POA: Diagnosis not present

## 2018-11-14 DIAGNOSIS — M25572 Pain in left ankle and joints of left foot: Secondary | ICD-10-CM | POA: Diagnosis not present

## 2018-11-14 DIAGNOSIS — M25671 Stiffness of right ankle, not elsewhere classified: Secondary | ICD-10-CM | POA: Diagnosis not present

## 2018-11-14 DIAGNOSIS — M6281 Muscle weakness (generalized): Secondary | ICD-10-CM | POA: Diagnosis not present

## 2018-11-14 DIAGNOSIS — R2689 Other abnormalities of gait and mobility: Secondary | ICD-10-CM
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
## 2018-11-17 ENCOUNTER — Encounter: Payer: Self-pay | Admitting: Physical Therapy

## 2018-11-17 ENCOUNTER — Ambulatory Visit: Payer: Medicare HMO | Admitting: Physical Therapy

## 2018-11-17 DIAGNOSIS — M25572 Pain in left ankle and joints of left foot: Secondary | ICD-10-CM | POA: Diagnosis not present

## 2018-11-17 DIAGNOSIS — M25571 Pain in right ankle and joints of right foot: Secondary | ICD-10-CM

## 2018-11-17 DIAGNOSIS — M6281 Muscle weakness (generalized): Secondary | ICD-10-CM

## 2018-11-17 DIAGNOSIS — R2689 Other abnormalities of gait and mobility: Secondary | ICD-10-CM | POA: Diagnosis not present

## 2018-11-17 DIAGNOSIS — M25672 Stiffness of left ankle, not elsewhere classified: Secondary | ICD-10-CM

## 2018-11-17 DIAGNOSIS — M25671 Stiffness of right ankle, not elsewhere classified: Secondary | ICD-10-CM | POA: Diagnosis not present
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
## 2018-11-20 ENCOUNTER — Other Ambulatory Visit: Payer: Self-pay | Admitting: Primary Care

## 2018-11-20 DIAGNOSIS — I10 Essential (primary) hypertension: Secondary | ICD-10-CM

## 2018-11-20 NOTE — Telephone Encounter (Signed)
Last prescribed on 08/25/2018  Last office visit on 05/30/2018 for acute with Rollene Fare B

## 2018-11-20 NOTE — Telephone Encounter (Signed)
Patient is overdue for general medical follow-up/CPE.  Please have her scheduled at her convenience.

## 2018-11-21 ENCOUNTER — Ambulatory Visit: Payer: Medicare HMO

## 2018-11-21 DIAGNOSIS — R2689 Other abnormalities of gait and mobility: Secondary | ICD-10-CM

## 2018-11-21 DIAGNOSIS — M25572 Pain in left ankle and joints of left foot: Secondary | ICD-10-CM | POA: Diagnosis not present

## 2018-11-21 DIAGNOSIS — M6281 Muscle weakness (generalized): Secondary | ICD-10-CM | POA: Diagnosis not present

## 2018-11-21 DIAGNOSIS — M25671 Stiffness of right ankle, not elsewhere classified: Secondary | ICD-10-CM

## 2018-11-21 DIAGNOSIS — M25672 Stiffness of left ankle, not elsewhere classified: Secondary | ICD-10-CM

## 2018-11-21 DIAGNOSIS — M25571 Pain in right ankle and joints of right foot: Secondary | ICD-10-CM | POA: Diagnosis not present
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
## 2018-11-21 NOTE — Patient Instructions (Signed)
Standing DF/PF , balance single leg  With opposite leg movements x 10-20 reps,   2x/day

## 2018-11-22 ENCOUNTER — Encounter

## 2018-11-27 ENCOUNTER — Ambulatory Visit: Payer: Medicare HMO

## 2018-11-27 DIAGNOSIS — M25671 Stiffness of right ankle, not elsewhere classified: Secondary | ICD-10-CM

## 2018-11-27 DIAGNOSIS — M6281 Muscle weakness (generalized): Secondary | ICD-10-CM

## 2018-11-27 DIAGNOSIS — M25572 Pain in left ankle and joints of left foot: Secondary | ICD-10-CM

## 2018-11-27 DIAGNOSIS — M25672 Stiffness of left ankle, not elsewhere classified: Secondary | ICD-10-CM | POA: Diagnosis not present

## 2018-11-27 DIAGNOSIS — M25571 Pain in right ankle and joints of right foot: Secondary | ICD-10-CM

## 2018-11-27 DIAGNOSIS — R2689 Other abnormalities of gait and mobility: Secondary | ICD-10-CM | POA: Diagnosis not present

## 2018-11-27 NOTE — Patient Instructions (Signed)
Inversion: Resisted   Cross legs with right leg underneath, foot in tubing loop. Hold tubing around other foot to resist and turn foot in. Repeat ____ times per set. Do ____ sets per session. Do ____ sessions per day.  http://orth.exer.us/12   Copyright  VHI. All rights reserved.  Eversion: Resisted   With right foot in tubing loop, hold tubing around other foot to resist and turn foot out. Repeat ____ times per set. Do ____ sets per session. Do ____ sessions per day.  http://orth.exer.us/14   Copyright  VHI. All rights reserved.  Plantar Flexion: Resisted   Anchor behind, tubing around left foot, press down. Repeat ____ times per set. Do ____ sets per session. Do ____ sessions per day.  http://orth.exer.us/10   Copyright  VHI. All rights reserved.  Dorsiflexion: Resisted   Facing anchor, tubing around left foot, pull toward face.  Repeat ____ times per set. Do ____ sets per session. Do ____ sessions per day.  http://orth.exer.us/8   Copyright  VHI. All rights reserved.   10-30 REPS 1X/DAY rt/LT
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
## 2018-11-30 ENCOUNTER — Ambulatory Visit: Payer: Medicare HMO | Admitting: Physical Therapy

## 2018-11-30 ENCOUNTER — Encounter: Payer: Self-pay | Admitting: Physical Therapy

## 2018-11-30 DIAGNOSIS — M25671 Stiffness of right ankle, not elsewhere classified: Secondary | ICD-10-CM

## 2018-11-30 DIAGNOSIS — M6281 Muscle weakness (generalized): Secondary | ICD-10-CM

## 2018-11-30 DIAGNOSIS — M25572 Pain in left ankle and joints of left foot: Secondary | ICD-10-CM | POA: Diagnosis not present

## 2018-11-30 DIAGNOSIS — R2689 Other abnormalities of gait and mobility: Secondary | ICD-10-CM

## 2018-11-30 DIAGNOSIS — M25571 Pain in right ankle and joints of right foot: Secondary | ICD-10-CM | POA: Diagnosis not present

## 2018-11-30 DIAGNOSIS — M25672 Stiffness of left ankle, not elsewhere classified: Secondary | ICD-10-CM | POA: Diagnosis not present
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
## 2018-12-04 ENCOUNTER — Encounter: Payer: Self-pay | Admitting: Physical Therapy

## 2018-12-07 ENCOUNTER — Ambulatory Visit (INDEPENDENT_AMBULATORY_CARE_PROVIDER_SITE_OTHER): Payer: Medicare HMO

## 2018-12-07 ENCOUNTER — Ambulatory Visit (INDEPENDENT_AMBULATORY_CARE_PROVIDER_SITE_OTHER): Payer: Medicare HMO | Admitting: Orthopaedic Surgery

## 2018-12-07 ENCOUNTER — Encounter (INDEPENDENT_AMBULATORY_CARE_PROVIDER_SITE_OTHER): Payer: Self-pay | Admitting: Orthopaedic Surgery

## 2018-12-07 DIAGNOSIS — S8254XD Nondisplaced fracture of medial malleolus of right tibia, subsequent encounter for closed fracture with routine healing: Secondary | ICD-10-CM
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
## 2018-12-11 ENCOUNTER — Ambulatory Visit: Payer: Medicare HMO | Admitting: Physical Therapy

## 2018-12-11 ENCOUNTER — Encounter: Payer: Self-pay | Admitting: Physical Therapy

## 2018-12-11 DIAGNOSIS — M25571 Pain in right ankle and joints of right foot: Secondary | ICD-10-CM | POA: Diagnosis not present

## 2018-12-11 DIAGNOSIS — M6281 Muscle weakness (generalized): Secondary | ICD-10-CM | POA: Diagnosis not present

## 2018-12-11 DIAGNOSIS — M25672 Stiffness of left ankle, not elsewhere classified: Secondary | ICD-10-CM | POA: Diagnosis not present

## 2018-12-11 DIAGNOSIS — M25671 Stiffness of right ankle, not elsewhere classified: Secondary | ICD-10-CM | POA: Diagnosis not present

## 2018-12-11 DIAGNOSIS — R2689 Other abnormalities of gait and mobility: Secondary | ICD-10-CM

## 2018-12-11 DIAGNOSIS — M25572 Pain in left ankle and joints of left foot: Secondary | ICD-10-CM | POA: Diagnosis not present
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
## 2018-12-12 ENCOUNTER — Ambulatory Visit (INDEPENDENT_AMBULATORY_CARE_PROVIDER_SITE_OTHER): Payer: Medicare HMO | Admitting: Orthopaedic Surgery

## 2018-12-15 ENCOUNTER — Ambulatory Visit: Payer: Medicare HMO | Attending: Physician Assistant | Admitting: Physical Therapy

## 2018-12-15 ENCOUNTER — Encounter: Payer: Self-pay | Admitting: Physical Therapy

## 2018-12-15 DIAGNOSIS — M25672 Stiffness of left ankle, not elsewhere classified: Secondary | ICD-10-CM | POA: Diagnosis not present

## 2018-12-15 DIAGNOSIS — M25571 Pain in right ankle and joints of right foot: Secondary | ICD-10-CM | POA: Diagnosis not present

## 2018-12-15 DIAGNOSIS — R2689 Other abnormalities of gait and mobility: Secondary | ICD-10-CM | POA: Diagnosis not present

## 2018-12-15 DIAGNOSIS — M25671 Stiffness of right ankle, not elsewhere classified: Secondary | ICD-10-CM | POA: Diagnosis not present

## 2018-12-15 DIAGNOSIS — M6281 Muscle weakness (generalized): Secondary | ICD-10-CM

## 2018-12-15 DIAGNOSIS — M25572 Pain in left ankle and joints of left foot: Secondary | ICD-10-CM
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
## 2018-12-18 ENCOUNTER — Telehealth: Payer: Self-pay | Admitting: Physical Therapy

## 2018-12-18 NOTE — Telephone Encounter (Signed)
Message left for Ms Ann Barker about today's missed appointment and to call if cannot make 12/26/18 appointment with Janett Billow.

## 2018-12-26 ENCOUNTER — Ambulatory Visit: Payer: Medicare HMO | Admitting: Physical Therapy

## 2018-12-26 ENCOUNTER — Encounter: Payer: Self-pay | Admitting: Physical Therapy

## 2018-12-26 ENCOUNTER — Ambulatory Visit: Payer: Medicare HMO

## 2018-12-26 DIAGNOSIS — R2689 Other abnormalities of gait and mobility: Secondary | ICD-10-CM

## 2018-12-26 DIAGNOSIS — M25671 Stiffness of right ankle, not elsewhere classified: Secondary | ICD-10-CM | POA: Diagnosis not present

## 2018-12-26 DIAGNOSIS — M25571 Pain in right ankle and joints of right foot: Secondary | ICD-10-CM

## 2018-12-26 DIAGNOSIS — M25572 Pain in left ankle and joints of left foot: Secondary | ICD-10-CM

## 2018-12-26 DIAGNOSIS — M6281 Muscle weakness (generalized): Secondary | ICD-10-CM

## 2018-12-26 DIAGNOSIS — M25672 Stiffness of left ankle, not elsewhere classified: Secondary | ICD-10-CM | POA: Diagnosis not present
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
## 2018-12-27 ENCOUNTER — Ambulatory Visit: Payer: Medicare HMO

## 2018-12-28 ENCOUNTER — Ambulatory Visit: Payer: Medicare HMO | Admitting: Physical Therapy

## 2018-12-28 ENCOUNTER — Encounter: Payer: Self-pay | Admitting: Physical Therapy

## 2018-12-28 DIAGNOSIS — R2689 Other abnormalities of gait and mobility: Secondary | ICD-10-CM

## 2018-12-28 DIAGNOSIS — M25672 Stiffness of left ankle, not elsewhere classified: Secondary | ICD-10-CM

## 2018-12-28 DIAGNOSIS — M25572 Pain in left ankle and joints of left foot: Secondary | ICD-10-CM | POA: Diagnosis not present

## 2018-12-28 DIAGNOSIS — M25571 Pain in right ankle and joints of right foot: Secondary | ICD-10-CM | POA: Diagnosis not present

## 2018-12-28 DIAGNOSIS — M25671 Stiffness of right ankle, not elsewhere classified: Secondary | ICD-10-CM | POA: Diagnosis not present

## 2018-12-28 DIAGNOSIS — M6281 Muscle weakness (generalized): Secondary | ICD-10-CM | POA: Diagnosis not present
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
## 2019-01-01 ENCOUNTER — Ambulatory Visit: Payer: Medicare HMO | Admitting: Physical Therapy

## 2019-01-01 ENCOUNTER — Encounter: Payer: Medicare HMO | Admitting: Primary Care

## 2019-01-05 ENCOUNTER — Encounter: Payer: Self-pay | Admitting: Physical Therapy

## 2019-01-05 ENCOUNTER — Ambulatory Visit: Payer: Medicare HMO | Admitting: Physical Therapy

## 2019-01-05 ENCOUNTER — Encounter: Payer: Medicare HMO | Admitting: Primary Care

## 2019-01-05 DIAGNOSIS — M25571 Pain in right ankle and joints of right foot: Secondary | ICD-10-CM

## 2019-01-05 DIAGNOSIS — R2689 Other abnormalities of gait and mobility: Secondary | ICD-10-CM | POA: Diagnosis not present

## 2019-01-05 DIAGNOSIS — M25572 Pain in left ankle and joints of left foot: Secondary | ICD-10-CM | POA: Diagnosis not present

## 2019-01-05 DIAGNOSIS — M25671 Stiffness of right ankle, not elsewhere classified: Secondary | ICD-10-CM | POA: Diagnosis not present

## 2019-01-05 DIAGNOSIS — M25672 Stiffness of left ankle, not elsewhere classified: Secondary | ICD-10-CM

## 2019-01-05 DIAGNOSIS — M6281 Muscle weakness (generalized): Secondary | ICD-10-CM
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
## 2019-01-07 ENCOUNTER — Other Ambulatory Visit: Payer: Self-pay | Admitting: Primary Care

## 2019-01-07 DIAGNOSIS — I10 Essential (primary) hypertension: Secondary | ICD-10-CM

## 2019-01-07 DIAGNOSIS — R7303 Prediabetes: Secondary | ICD-10-CM

## 2019-01-07 DIAGNOSIS — D649 Anemia, unspecified: Secondary | ICD-10-CM

## 2019-01-07 DIAGNOSIS — E785 Hyperlipidemia, unspecified: Secondary | ICD-10-CM

## 2019-01-09 ENCOUNTER — Ambulatory Visit (INDEPENDENT_AMBULATORY_CARE_PROVIDER_SITE_OTHER): Payer: Medicare HMO

## 2019-01-09 DIAGNOSIS — E785 Hyperlipidemia, unspecified: Secondary | ICD-10-CM

## 2019-01-09 DIAGNOSIS — R7303 Prediabetes: Secondary | ICD-10-CM | POA: Diagnosis not present

## 2019-01-09 DIAGNOSIS — I10 Essential (primary) hypertension: Secondary | ICD-10-CM

## 2019-01-09 DIAGNOSIS — D649 Anemia, unspecified: Secondary | ICD-10-CM | POA: Diagnosis not present

## 2019-01-09 DIAGNOSIS — Z0000 Encounter for general adult medical examination without abnormal findings: Secondary | ICD-10-CM

## 2019-01-09 LAB — COMPREHENSIVE METABOLIC PANEL
ALT: 17 U/L (ref 0–35)
AST: 15 U/L (ref 0–37)
Albumin: 3.9 g/dL (ref 3.5–5.2)
Alkaline Phosphatase: 84 U/L (ref 39–117)
BUN: 16 mg/dL (ref 6–23)
CO2: 28 mEq/L (ref 19–32)
Calcium: 9.2 mg/dL (ref 8.4–10.5)
Chloride: 109 mEq/L (ref 96–112)
Creatinine, Ser: 0.86 mg/dL (ref 0.40–1.20)
GFR: 79.05 mL/min (ref >60.00)
Glucose, Bld: 96 mg/dL (ref 70–99)
Potassium: 3.7 mEq/L (ref 3.5–5.1)
Sodium: 142 mEq/L (ref 135–145)
Total Bilirubin: 0.6 mg/dL (ref 0.2–1.2)
Total Protein: 7.1 g/dL (ref 6.0–8.3)

## 2019-01-09 LAB — LIPID PANEL
Cholesterol: 188 mg/dL (ref 0–200)
HDL: 65.2 mg/dL (ref >39.00)
LDL Cholesterol: 103 mg/dL — ABNORMAL HIGH (ref 0–99)
NonHDL: 123.07
Total CHOL/HDL Ratio: 3
Triglycerides: 102 mg/dL (ref 0.0–149.0)
VLDL: 20.4 mg/dL (ref 0.0–40.0)

## 2019-01-09 LAB — CBC
HCT: 37.9 % (ref 36.0–46.0)
Hemoglobin: 12.2 g/dL (ref 12.0–15.0)
MCHC: 32.2 g/dL (ref 30.0–36.0)
MCV: 87.5 fl (ref 78.0–100.0)
Platelets: 248 10*3/uL (ref 150.0–400.0)
RBC: 4.33 Mil/uL (ref 3.87–5.11)
RDW: 14 % (ref 11.5–15.5)
WBC: 6.7 10*3/uL (ref 4.0–10.5)

## 2019-01-09 LAB — HEMOGLOBIN A1C: Hgb A1c MFr Bld: 5.6 % (ref 4.6–6.5)

## 2019-01-09 NOTE — Progress Notes (Signed)
Encounter for lab tests only. Annual wellness visit was not completed. Patient visibly cried and verbalized feelings about estranged husband, handicap daughter, and grandchildren's mental health. PCP was notified.

## 2019-01-11 ENCOUNTER — Encounter: Payer: Self-pay | Admitting: Physical Therapy

## 2019-01-11 ENCOUNTER — Ambulatory Visit: Payer: Medicare HMO | Admitting: Physical Therapy

## 2019-01-11 DIAGNOSIS — M25571 Pain in right ankle and joints of right foot: Secondary | ICD-10-CM | POA: Diagnosis not present

## 2019-01-11 DIAGNOSIS — M25572 Pain in left ankle and joints of left foot: Secondary | ICD-10-CM | POA: Diagnosis not present

## 2019-01-11 DIAGNOSIS — M25672 Stiffness of left ankle, not elsewhere classified: Secondary | ICD-10-CM

## 2019-01-11 DIAGNOSIS — M6281 Muscle weakness (generalized): Secondary | ICD-10-CM

## 2019-01-11 DIAGNOSIS — R2689 Other abnormalities of gait and mobility: Secondary | ICD-10-CM | POA: Diagnosis not present

## 2019-01-11 DIAGNOSIS — M25671 Stiffness of right ankle, not elsewhere classified: Secondary | ICD-10-CM
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
## 2019-01-14 ENCOUNTER — Other Ambulatory Visit: Payer: Self-pay | Admitting: Primary Care

## 2019-01-14 DIAGNOSIS — K219 Gastro-esophageal reflux disease without esophagitis: Secondary | ICD-10-CM

## 2019-01-15 ENCOUNTER — Ambulatory Visit: Payer: Medicare HMO | Attending: Physician Assistant | Admitting: Physical Therapy

## 2019-01-15 DIAGNOSIS — M25672 Stiffness of left ankle, not elsewhere classified: Secondary | ICD-10-CM | POA: Diagnosis not present

## 2019-01-15 DIAGNOSIS — M25571 Pain in right ankle and joints of right foot: Secondary | ICD-10-CM | POA: Insufficient documentation

## 2019-01-15 DIAGNOSIS — M25671 Stiffness of right ankle, not elsewhere classified: Secondary | ICD-10-CM

## 2019-01-15 DIAGNOSIS — M6281 Muscle weakness (generalized): Secondary | ICD-10-CM | POA: Insufficient documentation

## 2019-01-15 DIAGNOSIS — R2689 Other abnormalities of gait and mobility: Secondary | ICD-10-CM

## 2019-01-15 DIAGNOSIS — M25572 Pain in left ankle and joints of left foot: Secondary | ICD-10-CM | POA: Diagnosis not present
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
## 2019-01-16 ENCOUNTER — Encounter: Payer: Medicare HMO | Admitting: Primary Care

## 2019-01-18 ENCOUNTER — Ambulatory Visit (INDEPENDENT_AMBULATORY_CARE_PROVIDER_SITE_OTHER): Payer: Medicare HMO | Admitting: Primary Care

## 2019-01-18 ENCOUNTER — Encounter: Payer: Self-pay | Admitting: Primary Care

## 2019-01-18 VITALS — BP 144/90 | HR 81 | Temp 98.0°F | Ht 64.0 in | Wt 248.5 lb

## 2019-01-18 DIAGNOSIS — R5382 Chronic fatigue, unspecified: Secondary | ICD-10-CM | POA: Diagnosis not present

## 2019-01-18 DIAGNOSIS — R519 Headache, unspecified: Secondary | ICD-10-CM

## 2019-01-18 DIAGNOSIS — R7303 Prediabetes: Secondary | ICD-10-CM

## 2019-01-18 DIAGNOSIS — I890 Lymphedema, not elsewhere classified: Secondary | ICD-10-CM | POA: Diagnosis not present

## 2019-01-18 DIAGNOSIS — S8265XA Nondisplaced fracture of lateral malleolus of left fibula, initial encounter for closed fracture: Secondary | ICD-10-CM

## 2019-01-18 DIAGNOSIS — E2839 Other primary ovarian failure: Secondary | ICD-10-CM

## 2019-01-18 DIAGNOSIS — S8254XD Nondisplaced fracture of medial malleolus of right tibia, subsequent encounter for closed fracture with routine healing: Secondary | ICD-10-CM

## 2019-01-18 DIAGNOSIS — Z1211 Encounter for screening for malignant neoplasm of colon: Secondary | ICD-10-CM

## 2019-01-18 DIAGNOSIS — Z1239 Encounter for other screening for malignant neoplasm of breast: Secondary | ICD-10-CM

## 2019-01-18 DIAGNOSIS — K219 Gastro-esophageal reflux disease without esophagitis: Secondary | ICD-10-CM | POA: Diagnosis not present

## 2019-01-18 DIAGNOSIS — Z0000 Encounter for general adult medical examination without abnormal findings: Secondary | ICD-10-CM

## 2019-01-18 DIAGNOSIS — D649 Anemia, unspecified: Secondary | ICD-10-CM

## 2019-01-18 DIAGNOSIS — Z23 Encounter for immunization: Secondary | ICD-10-CM

## 2019-01-18 DIAGNOSIS — R51 Headache: Secondary | ICD-10-CM

## 2019-01-18 DIAGNOSIS — I10 Essential (primary) hypertension: Secondary | ICD-10-CM

## 2019-01-18 DIAGNOSIS — N281 Cyst of kidney, acquired: Secondary | ICD-10-CM

## 2019-01-18 DIAGNOSIS — E785 Hyperlipidemia, unspecified: Secondary | ICD-10-CM

## 2019-01-18 LAB — TSH: TSH: 1.7 u[IU]/mL (ref 0.35–4.50)

## 2019-01-18 LAB — VITAMIN D 25 HYDROXY (VIT D DEFICIENCY, FRACTURES): VITD: 29.58 ng/mL — ABNORMAL LOW (ref 30.00–100.00)

## 2019-01-18 LAB — VITAMIN B12: Vitamin B-12: 441 pg/mL (ref 211–911)

## 2019-01-18 MED ORDER — OMEPRAZOLE 40 MG PO CPDR: 40.0000 mg | DELAYED_RELEASE_CAPSULE | Freq: Every day | ORAL | 3 refills | Status: DC

## 2019-01-18 NOTE — Assessment & Plan Note (Signed)
Above goal in the office today, also on a previous visit. She endorses that her blood pressure is "normal" during physical therapy sessions. I asked that she have the physical therapist write down her blood pressure so she can call me with readings. We will continue amlodipine 10 mg for now and have her follow-up in 3 months for blood pressure check.

## 2019-01-18 NOTE — Progress Notes (Signed)
Subjective:    Patient ID: Ann Barker, female    DOB: [birth year], 68 y.o.   MRN: [id number]  HPI  Ann Barker is a 68 year old female who presents today for complete physical. She saw our health advisor last week. She also endorses a chief complaint of anxiety.  Immunizations: -Tetanus: Completed in 2017 -Influenza: Declines  -Pneumonia: Never completed, due today -Shingles: Completed in 2007  Diet: She endorses a fair diet Breakfast: Boiled egg, toast, bacon Lunch: Skips Dinner: Vegetables, meat, starch Snacks: None Desserts: Twice weekly  Beverages: Juice, water, Ginger Ale  Exercise: She is not exercising Eye exam: Completed in 2020 Dental exam: No recent exam Colonoscopy: Completed 10 years ago.  Dexa: No recent exam  Mammogram: Completed several years ago    BP Readings from Last 3 Encounters:  01/18/19 (!) 144/90  07/28/18 (!) 147/90  05/30/18 130/78   She is not checking her BP at home but PT is checking infrequently. She's been told that her BP is "fine" when it was previously checked. She denies headaches, chest pain, dizziness.   Review of Systems  Constitutional: Positive for fatigue. Negative for unexpected weight change.  HENT: Negative for rhinorrhea.   Respiratory: Negative for cough and shortness of breath.        Snoring  Cardiovascular: Negative for chest pain.  Gastrointestinal: Negative for constipation and diarrhea.  Genitourinary: Negative for difficulty urinating.  Musculoskeletal: Positive for arthralgias.  Skin: Negative for rash.  Allergic/Immunologic: Negative for environmental allergies.  Neurological: Negative for dizziness, numbness and headaches.  Psychiatric/Behavioral:       Going through a lot of stress with her personal life.        Past Medical History:  Diagnosis Date  . Acid reflux   . Anemia   . Arthritis   . Cancer ([place]) 1993   SKIN CANCER  . Complication of anesthesia    HARD TO WAKE UP  . Concussion  01/2017  . Dizziness   . Family history of adverse reaction to anesthesia    PT UNSURE OF FAMILY HISTORY  . Gallstones   . Hematuria    microscopic  . History of hiatal hernia   . History of kidney stones    H/O  . HLD (hyperlipidemia)   . Hoarseness   . Hypertension   . Lower extremity edema    pt states this is chronic and has been told she has lymph edema-wears compression stockings  . Lower leg edema   . Over weight   . PONV (postoperative nausea and vomiting)   . Renal cyst   . Seizures (HCC)    AS A CHILD AND THEN WITH 1 PREGNANCY BUT NONE SINCE  . Sleep apnea    NO CPAP  . Symptomatic cholelithiasis 04/14/2018  . Urge incontinence   . UTI (lower urinary tract infection)   . Vitamin D deficiency      Social History   Socioeconomic History  . Marital status: Married    Spouse name: Not on file  . Number of children: Not on file  . Years of education: Not on file  . Highest education level: Not on file  Occupational History  . Not on file  Social Needs  . Financial resource strain: Not on file  . Food insecurity:    Worry: Not on file    Inability: Not on file  . Transportation needs:    Medical: Not on file    Non-medical: Not on file  Tobacco Use  . Smoking status: Never Smoker  . Smokeless tobacco: Never Used  Substance and Sexual Activity  . Alcohol use: No    Alcohol/week: 0.0 standard drinks  . Drug use: No  . Sexual activity: Not on file  Lifestyle  . Physical activity:    Days per week: Not on file    Minutes per session: Not on file  . Stress: Not on file  Relationships  . Social connections:    Talks on phone: Not on file    Gets together: Not on file    Attends religious service: Not on file    Active member of club or organization: Not on file    Attends meetings of clubs or organizations: Not on file    Relationship status: Not on file  . Intimate partner violence:    Fear of current or ex partner: Not on file    Emotionally abused:  Not on file    Physically abused: Not on file    Forced sexual activity: Not on file  Other Topics Concern  . Not on file  Social History Narrative  . Not on file    Past Surgical History:  Procedure Laterality Date  . ABDOMINAL HYSTERECTOMY  1981  . BREAST BIOPSY  1995/2005  . CHOLECYSTECTOMY N/A 05/10/2018   Procedure: LAPAROSCOPIC CHOLECYSTECTOMY;  Surgeon: Vickie Epley, MD;  Location: [facility] ORS;  Service: General;  Laterality: N/A;  . HERNIA REPAIR  2005  . LAPAROSCOPIC LYSIS OF ADHESIONS N/A 05/10/2018   Procedure: LAPAROSCOPIC LYSIS OF ADHESIONS;  Surgeon: Vickie Epley, MD;  Location: [facility] ORS;  Service: General;  Laterality: N/A;  . TUBAL LIGATION  1975    Family History  Problem Relation Age of Onset  . Cancer Paternal Aunt   . Stroke Paternal Uncle   . Prostate cancer Neg Hx   . Kidney disease Neg Hx   . Bladder Cancer Neg Hx     Allergies  Allergen Reactions  . Morphine Other (See Comments)    Unknown  . Clindamycin/Lincomycin Diarrhea  . Methylprednisolone Swelling  . Codeine Palpitations and Other (See Comments)    unknown  . Morphine And Related Palpitations  . Penicillins Rash and Other (See Comments)    unknown    Current Outpatient Medications on File Prior to Visit  Medication Sig Dispense Refill  . amLODipine (NORVASC) 10 MG tablet TAKE 1 TABLET BY MOUTH EVERY DAY NEEDS APPT FOR REFILLS (Patient taking differently: Take 10 mg by mouth daily. ) 90 tablet 0  . cholecalciferol (VITAMIN D) 1000 units tablet Take 1,000 Units by mouth daily.    . folic acid (FOLVITE) 627 MCG tablet Take 800 mcg by mouth daily.    Marland Kitchen ibuprofen (MOTRIN IB) 200 MG tablet Take 2 tablets (400 mg total) by mouth every 8 (eight) hours as needed (for pain, with food.).    Marland Kitchen Multiple Vitamin (MULTIVITAMIN) capsule Take 1 capsule by mouth daily.    . mupirocin ointment (BACTROBAN) 2 % Apply 1 application topically 2 (two) times daily. 22 g 0   No current  facility-administered medications on file prior to visit.     BP (!) 144/90   Pulse 81   Temp 98 F (36.7 C) (Oral)   Ht 5\' 4"  (1.626 m)   Wt 248 lb 8 oz (112.7 kg)   SpO2 98%   BMI 42.65 kg/m    Objective:   Physical Exam  Constitutional: She is oriented to person, place, and  time. She appears well-nourished.  HENT:  Mouth/Throat: No oropharyngeal exudate.  Eyes: Pupils are equal, round, and reactive to light. EOM are normal.  Neck: Neck supple.  Cardiovascular: Normal rate and regular rhythm.  Respiratory: Effort normal and breath sounds normal.  GI: Soft. Bowel sounds are normal. There is no abdominal tenderness.  Musculoskeletal: Normal range of motion.  Neurological: She is alert and oriented to person, place, and time.  Skin: Skin is warm and dry.  Psychiatric: She has a normal mood and affect.           Assessment & Plan:

## 2019-01-18 NOTE — Assessment & Plan Note (Signed)
Noted on CT scan from April 2019.

## 2019-01-18 NOTE — Assessment & Plan Note (Addendum)
Chronic for years, increased since May 2019. Unclear if she has a history of OSA but tells me today that she completed a sleep study and was told that she didn't need CPAP machine. She does snore loudly at night. Discussed the need for a sleep study, she declines for now but agrees if labs are unremarkable. Labs today unremarkable. Add on TSH, B12, Vitamin D.

## 2019-01-18 NOTE — Assessment & Plan Note (Signed)
Tetanus UTD, pneumonia vaccination provided today. Declines influenza. Bone density scan and mammogram due, orders placed.  Recommended to work on diet, start with regular exercise.  Exam stable. Labs reviewed. Follow up in 1 for CPE.

## 2019-01-18 NOTE — Patient Instructions (Signed)
Stop by the lab prior to leaving today. I will notify you of your results once received.   [facility] to schedule your mamm[facility].  You will be contacted regarding your referral to GI for the colonoscopy.  Please let us know if you have not been contacted within one week.   Start exercising. You should be getting 150 minutes of exercise weekly.  It's important to improve your diet by reducing consumption of fast food, fried food, processed snack foods, sugary drinks. Increase consumption of fresh vegetables and fruits, whole grains, water.  Ensure you are drinking 64 ounces of water daily.  Have the physical therapist check your blood pressure during each visit. Call me with those readings.   Schedule a follow up visit in 3 months for blood pressure check.   It was a pleasure to see you today!   Preventive Care 65 Years and Older, Female Preventive care refers to lifestyle choices and visits with your health care provider that can promote health and wellness. What does preventive care include?  A yearly physical exam. This is also called an annual well check.  Dental exams once or twice a year.  Routine eye exams. Ask your health care provider how often you should have your eyes checked.  Personal lifestyle choices, including: ? Daily care of your teeth and gums. ? Regular physical activity. ? Eating a healthy diet. ? Avoiding tobacco and drug use. ? Limiting alcohol use. ? Practicing safe sex. ? Taking low-dose aspirin every day. ? Taking vitamin and mineral supplements as recommended by your health care provider. What happens during an annual well check? The services and screenings done by your health care provider during your annual well check will depend on your age, overall health, lifestyle risk factors, and family history of disease. Counseling Your health care provider may ask you questions about your:  Alcohol use.  Tobacco use.  Drug  use.  Emotional well-being.  Home and relationship well-being.  Sexual activity.  Eating habits.  History of falls.  Memory and ability to understand (cognition).  Work and work Statistician.  Reproductive health.  Screening You may have the following tests or measurements:  Height, weight, and BMI.  Blood pressure.  Lipid and cholesterol levels. These may be checked every 5 years, or more frequently if you are over 37 years old.  Skin check.  Lung cancer screening. You may have this screening every year starting at age 13 if you have a 30-pack-year history of smoking and currently smoke or have quit within the past 15 years.  Colorectal cancer screening. All adults should have this screening starting at age 32 and continuing until age 69. You will have tests every 1-10 years, depending on your results and the type of screening test. People at increased risk should start screening at an earlier age. Screening tests may include: ? Guaiac-based fecal occult blood testing. ? Fecal immunochemical test (FIT). ? Stool DNA test. ? Virtual colonoscopy. ? Sigmoidoscopy. During this test, a flexible tube with a tiny camera (sigmoidoscope) is used to examine your rectum and lower colon. The sigmoidoscope is inserted through your anus into your rectum and lower colon. ? Colonoscopy. During this test, a long, thin, flexible tube with a tiny camera (colonoscope) is used to examine your entire colon and rectum.  Hepatitis C blood test.  Hepatitis B blood test.  Sexually transmitted disease (STD) testing.  Diabetes screening. This is done by checking your blood sugar (glucose) after you  have not eaten for a while (fasting). You may have this done every 1-3 years.  Bone density scan. This is done to screen for osteoporosis. You may have this done starting at age 33.  Mammogram. This may be done every 1-2 years. Talk to your health care provider about how often you should have regular  mammograms. Talk with your health care provider about your test results, treatment options, and if necessary, the need for more tests. Vaccines Your health care provider may recommend certain vaccines, such as:  Influenza vaccine. This is recommended every year.  Tetanus, diphtheria, and acellular pertussis (Tdap, Td) vaccine. You may need a Td booster every 10 years.  Varicella vaccine. You may need this if you have not been vaccinated.  Zoster vaccine. You may need this after age 14.  Measles, mumps, and rubella (MMR) vaccine. You may need at least one dose of MMR if you were born in 1957 or later. You may also need a second dose.  Pneumococcal 13-valent conjugate (PCV13) vaccine. One dose is recommended after age 11.  Pneumococcal polysaccharide (PPSV23) vaccine. One dose is recommended after age 59.  Meningococcal vaccine. You may need this if you have certain conditions.  Hepatitis A vaccine. You may need this if you have certain conditions or if you travel or work in places where you may be exposed to hepatitis A.  Hepatitis B vaccine. You may need this if you have certain conditions or if you travel or work in places where you may be exposed to hepatitis B.  Haemophilus influenzae type b (Hib) vaccine. You may need this if you have certain conditions. Talk to your health care provider about which screenings and vaccines you need and how often you need them. This information is not intended to replace advice given to you by your health care provider. Make sure you discuss any questions you have with your health care provider. Document Released: 12/26/2015 Document Revised: 01/19/2018 Document Reviewed: 09/30/2015 Elsevier Interactive Patient Education  2019 [employer].

## 2019-01-18 NOTE — Assessment & Plan Note (Signed)
Following with physical therapy, overall improved.

## 2019-01-18 NOTE — Assessment & Plan Note (Signed)
Recent CBC unremarkable. Continue to monitor.  

## 2019-01-18 NOTE — Assessment & Plan Note (Signed)
Denies recent headaches. Continue to monitor.

## 2019-01-18 NOTE — Assessment & Plan Note (Signed)
Following with physical therapy and orthopedics. Overall improved.

## 2019-01-18 NOTE — Assessment & Plan Note (Signed)
Repeat A1C normal. Continue to monitor. Encouraged weight loss and exercise.

## 2019-01-18 NOTE — Assessment & Plan Note (Signed)
Stable on recent labs. Continue to monitor.  

## 2019-01-18 NOTE — Assessment & Plan Note (Signed)
Overall improved. No recent treatment.

## 2019-01-18 NOTE — Assessment & Plan Note (Signed)
Doing well on omeprazole 40 mg daily since cholecystectomy May 2019. Refill sent to pharmacy.

## 2019-01-19 ENCOUNTER — Other Ambulatory Visit: Payer: Self-pay

## 2019-01-19 DIAGNOSIS — Z1211 Encounter for screening for malignant neoplasm of colon: Secondary | ICD-10-CM

## 2019-01-23 ENCOUNTER — Ambulatory Visit: Payer: Medicare HMO | Admitting: Physical Therapy

## 2019-01-23 ENCOUNTER — Encounter: Payer: Self-pay | Admitting: Physical Therapy

## 2019-01-23 VITALS — BP 140/86

## 2019-01-23 DIAGNOSIS — M6281 Muscle weakness (generalized): Secondary | ICD-10-CM

## 2019-01-23 DIAGNOSIS — M25671 Stiffness of right ankle, not elsewhere classified: Secondary | ICD-10-CM | POA: Diagnosis not present

## 2019-01-23 DIAGNOSIS — M25572 Pain in left ankle and joints of left foot: Secondary | ICD-10-CM | POA: Diagnosis not present

## 2019-01-23 DIAGNOSIS — R2689 Other abnormalities of gait and mobility: Secondary | ICD-10-CM | POA: Diagnosis not present

## 2019-01-23 DIAGNOSIS — M25571 Pain in right ankle and joints of right foot: Secondary | ICD-10-CM | POA: Diagnosis not present

## 2019-01-23 DIAGNOSIS — M25672 Stiffness of left ankle, not elsewhere classified: Secondary | ICD-10-CM
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
## 2019-01-25 ENCOUNTER — Telehealth: Payer: Self-pay

## 2019-01-25 NOTE — Telephone Encounter (Signed)
Pt seen 01/18/19; now pts left leg is hurting from the groin down the entire leg; pt does not see redness or swelling. Pt said pain level is 7 and pt was not able to sleep last night; pt cannot get appt with ortho until end of March and pt is going to [facility] ED. Pt wanted Gentry Fitz NP to know. FYI to Gentry Fitz NP.

## 2019-01-25 NOTE — Telephone Encounter (Signed)
Noted  

## 2019-01-30 ENCOUNTER — Telehealth: Payer: Self-pay | Admitting: Primary Care

## 2019-01-30 ENCOUNTER — Ambulatory Visit: Payer: Medicare HMO | Admitting: Physical Therapy

## 2019-01-30 VITALS — BP 128/78 | HR 81

## 2019-01-30 DIAGNOSIS — M25571 Pain in right ankle and joints of right foot: Secondary | ICD-10-CM

## 2019-01-30 DIAGNOSIS — M25671 Stiffness of right ankle, not elsewhere classified: Secondary | ICD-10-CM

## 2019-01-30 DIAGNOSIS — M25672 Stiffness of left ankle, not elsewhere classified: Secondary | ICD-10-CM | POA: Diagnosis not present

## 2019-01-30 DIAGNOSIS — M6281 Muscle weakness (generalized): Secondary | ICD-10-CM

## 2019-01-30 DIAGNOSIS — R2689 Other abnormalities of gait and mobility: Secondary | ICD-10-CM

## 2019-01-30 DIAGNOSIS — M25572 Pain in left ankle and joints of left foot: Secondary | ICD-10-CM | POA: Diagnosis not present
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
## 2019-01-30 NOTE — Telephone Encounter (Signed)
Left message asking pt to call office regarding bone denstiy and mammogram.  Transfer to robin.  If im not here anyone up front can help pt

## 2019-02-08 ENCOUNTER — Encounter: Payer: Self-pay | Admitting: Physical Therapy

## 2019-02-08 ENCOUNTER — Ambulatory Visit: Payer: Medicare HMO | Admitting: Physical Therapy

## 2019-02-08 DIAGNOSIS — M25572 Pain in left ankle and joints of left foot: Secondary | ICD-10-CM | POA: Diagnosis not present

## 2019-02-08 DIAGNOSIS — R2689 Other abnormalities of gait and mobility: Secondary | ICD-10-CM | POA: Diagnosis not present

## 2019-02-08 DIAGNOSIS — M25671 Stiffness of right ankle, not elsewhere classified: Secondary | ICD-10-CM

## 2019-02-08 DIAGNOSIS — M25672 Stiffness of left ankle, not elsewhere classified: Secondary | ICD-10-CM | POA: Diagnosis not present

## 2019-02-08 DIAGNOSIS — M6281 Muscle weakness (generalized): Secondary | ICD-10-CM | POA: Diagnosis not present

## 2019-02-08 DIAGNOSIS — M25571 Pain in right ankle and joints of right foot: Secondary | ICD-10-CM
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
## 2019-02-14 ENCOUNTER — Other Ambulatory Visit: Payer: Self-pay | Admitting: Primary Care

## 2019-02-14 DIAGNOSIS — I10 Essential (primary) hypertension: Secondary | ICD-10-CM

## 2019-02-15 ENCOUNTER — Telehealth: Payer: Self-pay | Admitting: Gastroenterology

## 2019-02-15 ENCOUNTER — Other Ambulatory Visit: Payer: Self-pay

## 2019-02-15 MED ORDER — PEG 3350-KCL-NA BICARB-NACL 420 G PO SOLR: 4000.0000 mL | Freq: Once | ORAL | 0 refills | Status: AC

## 2019-02-15 NOTE — Telephone Encounter (Signed)
Pt is calling to get rx for Gavaite instead of  Suprep [facility] in [place]  Also please call her for Directions and what rx to stop

## 2019-02-15 NOTE — Telephone Encounter (Signed)
Patients call has been returned.  Her new rx has been sent to pharmacy for [facility].  Advised her to begin drinking it the evening before her procedure between the hours of 5pm and 6 pm-drink 8 oz every 30 minutes until she completes in entirety.  She has been advised to also stop her Multivitamins today.  Thanks [employer]

## 2019-02-19 ENCOUNTER — Telehealth: Payer: Self-pay

## 2019-02-19 ENCOUNTER — Encounter: Payer: Self-pay | Admitting: *Deleted

## 2019-02-19 NOTE — Telephone Encounter (Signed)
Patient LVM for me to call her regarding her bowel prep.  Call has been returned.  Patient received Gavilyte bowel prep.  She has been advised to start drinking at 5pm drink 8oz every 30 minutes until she completes the entire contents.  Nothing to eat or drink 4 hours prior to procedure.  Thanks [employer]

## 2019-02-20 ENCOUNTER — Other Ambulatory Visit: Payer: Self-pay

## 2019-02-20 ENCOUNTER — Ambulatory Visit: Payer: Medicare HMO | Admitting: Anesthesiology

## 2019-02-20 ENCOUNTER — Encounter: Payer: Self-pay | Admitting: *Deleted

## 2019-02-20 ENCOUNTER — Encounter
Admission: RE | Disposition: A | Discharge status: Home / Self Care | Payer: Self-pay | Source: Home / Self Care | Attending: Gastroenterology

## 2019-02-20 ENCOUNTER — Ambulatory Visit
Admission: RE | Admit: 2019-02-20 | Discharge: 2019-02-20 | Disposition: A | Discharge status: Home / Self Care | Payer: Medicare HMO | Attending: Gastroenterology | Admitting: Gastroenterology

## 2019-02-20 DIAGNOSIS — E559 Vitamin D deficiency, unspecified: Secondary | ICD-10-CM | POA: Insufficient documentation

## 2019-02-20 DIAGNOSIS — Z1211 Encounter for screening for malignant neoplasm of colon: Secondary | ICD-10-CM | POA: Diagnosis not present

## 2019-02-20 DIAGNOSIS — K641 Second degree hemorrhoids: Secondary | ICD-10-CM | POA: Diagnosis not present

## 2019-02-20 DIAGNOSIS — Z85828 Personal history of other malignant neoplasm of skin: Secondary | ICD-10-CM | POA: Insufficient documentation

## 2019-02-20 DIAGNOSIS — I10 Essential (primary) hypertension: Secondary | ICD-10-CM | POA: Diagnosis not present

## 2019-02-20 DIAGNOSIS — Z79899 Other long term (current) drug therapy: Secondary | ICD-10-CM | POA: Diagnosis not present

## 2019-02-20 DIAGNOSIS — K635 Polyp of colon: Secondary | ICD-10-CM

## 2019-02-20 DIAGNOSIS — K5730 Diverticulosis of large intestine without perforation or abscess without bleeding: Secondary | ICD-10-CM | POA: Diagnosis not present

## 2019-02-20 DIAGNOSIS — K219 Gastro-esophageal reflux disease without esophagitis: Secondary | ICD-10-CM | POA: Diagnosis not present

## 2019-02-20 DIAGNOSIS — G4730 Sleep apnea, unspecified: Secondary | ICD-10-CM | POA: Diagnosis not present

## 2019-02-20 DIAGNOSIS — D125 Benign neoplasm of sigmoid colon: Secondary | ICD-10-CM | POA: Insufficient documentation

## 2019-02-20 DIAGNOSIS — K5790 Diverticulosis of intestine, part unspecified, without perforation or abscess without bleeding: Secondary | ICD-10-CM | POA: Diagnosis not present

## 2019-02-20 HISTORY — PX: COLONOSCOPY WITH PROPOFOL: SHX5780

## 2019-02-20 SURGERY — COLONOSCOPY WITH PROPOFOL
Anesthesia: General

## 2019-02-20 MED ORDER — LIDOCAINE HCL (PF) 2 % IJ SOLN
INTRAMUSCULAR | Status: AC
  Filled 2019-02-20: qty 10

## 2019-02-20 MED ORDER — PROPOFOL 10 MG/ML IV BOLUS
INTRAVENOUS | Status: AC
  Filled 2019-02-20: qty 80

## 2019-02-20 MED ORDER — PROPOFOL 500 MG/50ML IV EMUL
INTRAVENOUS | Status: DC | PRN
  Administered 2019-02-20: 100 ug/kg/min via INTRAVENOUS

## 2019-02-20 MED ORDER — PROPOFOL 10 MG/ML IV BOLUS
INTRAVENOUS | Status: DC | PRN
  Administered 2019-02-20: 40 mg via INTRAVENOUS
  Administered 2019-02-20: 60 mg via INTRAVENOUS

## 2019-02-20 MED ORDER — SODIUM CHLORIDE 0.9 % IV SOLN
INTRAVENOUS | Status: DC
  Administered 2019-02-20: 09:00:00 via INTRAVENOUS

## 2019-02-20 NOTE — Anesthesia Preprocedure Evaluation (Signed)
Anesthesia Evaluation  Patient identified by MRN, date of birth, ID band Patient awake    Reviewed: Allergy & Precautions, NPO status , Patient's Chart, lab work & pertinent test results, reviewed documented beta blocker date and time   History of Anesthesia Complications (+) PONV, Family history of anesthesia reaction and history of anesthetic complications  Airway Mallampati: III  TM Distance: >3 FB     Dental  (+) Chipped   Pulmonary sleep apnea ,           Cardiovascular hypertension, Pt. on medications      Neuro/Psych  Headaches, Seizures -,     GI/Hepatic hiatal hernia, GERD  ,  Endo/Other    Renal/GU Renal disease     Musculoskeletal  (+) Arthritis ,   Abdominal   Peds  Hematology  (+) anemia ,   Anesthesia Other Findings EKG ok. EF 60-65.  Reproductive/Obstetrics                             Anesthesia Physical Anesthesia Plan  ASA: III  Anesthesia Plan: General   Post-op Pain Management:    Induction: Intravenous  PONV Risk Score and Plan:   Airway Management Planned:   Additional Equipment:   Intra-op Plan:   Post-operative Plan:   Informed Consent: I have reviewed the patients History and Physical, chart, labs and discussed the procedure including the risks, benefits and alternatives for the proposed anesthesia with the patient or authorized representative who has indicated his/her understanding and acceptance.       Plan Discussed with: CRNA  Anesthesia Plan Comments:         Anesthesia Quick Evaluation

## 2019-02-20 NOTE — Op Note (Signed)
[facility] Gastroenterology Patient Name: Ann Barker Procedure Date: 02/20/2019 7:59 AM MRN: [id number] Account #: [id number] Date of Birth: 11-Feb-1951 Admit Type: Outpatient Age: 68 Room: [facility] ENDO ROOM 4 Gender: Female Note Status: Finalized Procedure:            Colonoscopy Indications:          Screening for colorectal malignant neoplasm Providers:            Lucilla Lame MD, MD Medicines:            Propofol per Anesthesia Complications:        No immediate complications. Procedure:            Pre-Anesthesia Assessment:                       - Prior to the procedure, a History and Physical was                        performed, and patient medications and allergies were                        reviewed. The patient's tolerance of previous                        anesthesia was also reviewed. The risks and benefits of                        the procedure and the sedation options and risks were                        discussed with the patient. All questions were                        answered, and informed consent was obtained. Prior                        Anticoagulants: The patient has taken no previous                        anticoagulant or antiplatelet agents. ASA Grade                        Assessment: II - A patient with mild systemic disease.                        After reviewing the risks and benefits, the patient was                        deemed in satisfactory condition to undergo the                        procedure.                       After obtaining informed consent, the colonoscope was                        passed under direct vision. Throughout the procedure,                        the patient's blood pressure,  pulse, and oxygen                        saturations were monitored continuously. The                        Colonoscope was introduced through the anus and                        advanced to the the cecum, identified by  appendiceal                        orifice and ileocecal valve. The colonoscopy was                        performed without difficulty. The patient tolerated the                        procedure well. The quality of the bowel preparation                        was excellent. Findings:      The perianal and digital rectal examinations were normal.      Two sessile polyps were found in the sigmoid colon. The polyps were 2 to       3 mm in size. These polyps were removed with a cold biopsy forceps.       Resection and retrieval were complete.      Multiple small-mouthed diverticula were found in the sigmoid colon.      Non-bleeding internal hemorrhoids were found during retroflexion. The       hemorrhoids were Grade II (internal hemorrhoids that prolapse but reduce       spontaneously). Impression:           - Two 2 to 3 mm polyps in the sigmoid colon, removed                        with a cold biopsy forceps. Resected and retrieved.                       - Diverticulosis in the sigmoid colon.                       - Non-bleeding internal hemorrhoids. Recommendation:       - Discharge patient to home.                       - Resume previous diet.                       - Continue present medications.                       - Await pathology results.                       - Repeat colonoscopy in 5 years if polyp adenoma and 10                        years if hyperplastic Procedure Code(s):    --- Professional ---  45380, Colonoscopy, flexible; with biopsy, single or                        multiple Diagnosis Code(s):    --- Professional ---                       Z12.11, Encounter for screening for malignant neoplasm                        of colon                       D12.5, Benign neoplasm of sigmoid colon CPT copyright 2018 American Medical Association. All rights reserved. The codes documented in this report are preliminary and upon coder review may  be revised to  meet current compliance requirements. Lucilla Lame MD, MD 02/20/2019 9:00:11 AM This report has been signed electronically. Number of Addenda: 0 Note Initiated On: 02/20/2019 7:59 AM Scope Withdrawal Time: 0 hours 6 minutes 55 seconds  Total Procedure Duration: 0 hours 11 minutes 5 seconds       [facility]

## 2019-02-20 NOTE — H&P (Signed)
Ann Lame, MD [facility] [address], [place], [place] [address] Phone: [phone] Fax : [phone]  Primary Care Physician:  Pleas Koch, NP Primary Gastroenterologist:  Dr. Allen Norris  Pre-Procedure History & Physical: HPI:  Ann Barker is a 68 y.o. female is here for a screening colonoscopy.   Past Medical History:  Diagnosis Date  . Acid reflux   . Anemia   . Arthritis   . Cancer ([place]) 1993   SKIN CANCER  . Complication of anesthesia    HARD TO WAKE UP  . Concussion 01/2017  . Dizziness   . Family history of adverse reaction to anesthesia    PT UNSURE OF FAMILY HISTORY  . Gallstones   . Hematuria    microscopic  . History of hiatal hernia   . History of kidney stones    H/O  . HLD (hyperlipidemia)   . Hoarseness   . Hypertension   . Lower extremity edema    pt states this is chronic and has been told she has lymph edema-wears compression stockings  . Lower leg edema   . Over weight   . PONV (postoperative nausea and vomiting)   . Renal cyst   . Seizures (HCC)    AS A CHILD AND THEN WITH 1 PREGNANCY BUT NONE SINCE  . Sleep apnea    NO CPAP  . Symptomatic cholelithiasis 04/14/2018  . Urge incontinence   . UTI (lower urinary tract infection)   . Vitamin D deficiency     Past Surgical History:  Procedure Laterality Date  . ABDOMINAL HYSTERECTOMY  1981  . BREAST BIOPSY  1995/2005  . CHOLECYSTECTOMY N/A 05/10/2018   Procedure: LAPAROSCOPIC CHOLECYSTECTOMY;  Surgeon: Vickie Epley, MD;  Location: [facility] ORS;  Service: General;  Laterality: N/A;  . HERNIA REPAIR  2005  . LAPAROSCOPIC LYSIS OF ADHESIONS N/A 05/10/2018   Procedure: LAPAROSCOPIC LYSIS OF ADHESIONS;  Surgeon: Vickie Epley, MD;  Location: [facility] ORS;  Service: General;  Laterality: N/A;  . [place]    Prior to Admission medications   Medication Sig Start Date End Date Taking? Authorizing Provider  amLODipine (NORVASC) 10 MG tablet Take 1 tablet (10 mg total) by  mouth daily. 02/14/19  Yes Pleas Koch, NP  cholecalciferol (VITAMIN D) 1000 units tablet Take 1,000 Units by mouth daily.   Yes [provider]  Multiple Vitamin (MULTIVITAMIN) capsule Take 1 capsule by mouth daily.   Yes [provider]  omeprazole (PRILOSEC) 40 MG capsule Take 1 capsule (40 mg total) by mouth at bedtime. 01/18/19  Yes Pleas Koch, NP  folic acid (FOLVITE) 979 MCG tablet Take 800 mcg by mouth daily.    [provider]  ibuprofen (MOTRIN IB) 200 MG tablet Take 2 tablets (400 mg total) by mouth every 8 (eight) hours as needed (for pain, with food.). Patient not taking: Reported on 02/20/2019 07/23/16   Tonia Ghent, MD  mupirocin ointment (BACTROBAN) 2 % Apply 1 application topically 2 (two) times daily. Patient not taking: Reported on 02/20/2019 01/18/17   Edrick Kins, DPM    Allergies as of 01/19/2019 - Review Complete 01/18/2019  Allergen Reaction Noted  . Morphine Other (See Comments) 02/03/2016  . Clindamycin/lincomycin Diarrhea 03/17/2016  . Methylprednisolone Swelling 09/09/2015  . Codeine Palpitations and Other (See Comments) 08/16/2015  . Morphine and related Palpitations 08/16/2015  . Penicillins Rash and Other (See Comments) 08/16/2015    Family History  Problem Relation Age  of Onset  . Cancer Paternal Aunt   . Stroke Paternal Uncle   . Prostate cancer Neg Hx   . Kidney disease Neg Hx   . Bladder Cancer Neg Hx     Social History   Socioeconomic History  . Marital status: Married    Spouse name: Not on file  . Number of children: Not on file  . Years of education: Not on file  . Highest education level: Not on file  Occupational History  . Not on file  Social Needs  . Financial resource strain: Not on file  . Food insecurity:    Worry: Not on file    Inability: Not on file  . Transportation needs:    Medical: Not on file    Non-medical: Not on file  Tobacco Use  . Smoking status: Never Smoker  .  Smokeless tobacco: Never Used  Substance and Sexual Activity  . Alcohol use: No    Alcohol/week: 0.0 standard drinks  . Drug use: No  . Sexual activity: Not on file  Lifestyle  . Physical activity:    Days per week: Not on file    Minutes per session: Not on file  . Stress: Not on file  Relationships  . Social connections:    Talks on phone: Not on file    Gets together: Not on file    Attends religious service: Not on file    Active member of club or organization: Not on file    Attends meetings of clubs or organizations: Not on file    Relationship status: Not on file  . Intimate partner violence:    Fear of current or ex partner: Not on file    Emotionally abused: Not on file    Physically abused: Not on file    Forced sexual activity: Not on file  Other Topics Concern  . Not on file  Social History Narrative  . Not on file    Review of Systems: See HPI, otherwise negative ROS  Physical Exam: BP (!) 148/97   Pulse 82   Temp 97.6 F (36.4 C) (Tympanic)   Resp 18   Ht 5\' 4"  (1.626 m)   Wt 104.3 kg   SpO2 100%   BMI 39.48 kg/m  General:   Alert,  pleasant and cooperative in NAD Head:  Normocephalic and atraumatic. Neck:  Supple; no masses or thyromegaly. Lungs:  Clear throughout to auscultation.    Heart:  Regular rate and rhythm. Abdomen:  Soft, nontender and nondistended. Normal bowel sounds, without guarding, and without rebound.   Neurologic:  Alert and  oriented x4;  grossly normal neurologically.  Impression/Plan: Ann Barker is now here to undergo a screening colonoscopy.  Risks, benefits, and alternatives regarding colonoscopy have been reviewed with the patient.  Questions have been answered.  All parties agreeable.

## 2019-02-20 NOTE — Transfer of Care (Signed)
Immediate Anesthesia Transfer of Care Note  Patient: Johna Roles  Procedure(s) Performed: COLONOSCOPY WITH PROPOFOL (N/A )  Patient Location: PACU and Endoscopy Unit  Anesthesia Type:General  Level of Consciousness: awake, oriented, drowsy and patient cooperative  Airway & Oxygen Therapy: Patient Spontanous Breathing  Post-op Assessment: Report given to RN, Post -op Vital signs reviewed and stable and Patient moving all extremities  Post vital signs: Reviewed and stable  Last Vitals:  Vitals Value Taken Time  BP 118/79 02/20/2019  9:02 AM  Temp 36.1 C 02/20/2019  9:01 AM  Pulse 65 02/20/2019  9:03 AM  Resp 14 02/20/2019  9:03 AM  SpO2 100 % 02/20/2019  9:03 AM  Vitals shown include unvalidated device data.  Last Pain:  Vitals:   02/20/19 0901  TempSrc: Tympanic  PainSc: Asleep         Complications: No apparent anesthesia complications

## 2019-02-20 NOTE — Anesthesia Post-op Follow-up Note (Signed)
Anesthesia QCDR form completed.        

## 2019-02-20 NOTE — Anesthesia Postprocedure Evaluation (Signed)
Anesthesia Post Note  Patient: Ann Barker  Procedure(s) Performed: COLONOSCOPY WITH PROPOFOL (N/A )  Patient location during evaluation: Endoscopy Anesthesia Type: General Level of consciousness: awake and alert Pain management: pain level controlled Vital Signs Assessment: post-procedure vital signs reviewed and stable Respiratory status: spontaneous breathing, nonlabored ventilation, respiratory function stable and patient connected to nasal cannula oxygen Cardiovascular status: blood pressure returned to baseline and stable Postop Assessment: no apparent nausea or vomiting Anesthetic complications: no     Last Vitals:  Vitals:   02/20/19 0921 02/20/19 0931  BP: 131/85 136/79  Pulse:    Resp: 14 20  Temp:    SpO2:      Last Pain:  Vitals:   02/20/19 0931  TempSrc:   PainSc: 0-No pain                 Brayden Brodhead S

## 2019-02-21 ENCOUNTER — Encounter: Payer: Self-pay | Admitting: Gastroenterology

## 2019-02-21 LAB — SURGICAL PATHOLOGY

## 2019-03-27 ENCOUNTER — Other Ambulatory Visit: Payer: Medicare HMO

## 2019-06-13 ENCOUNTER — Other Ambulatory Visit: Payer: Self-pay

## 2019-06-13 ENCOUNTER — Ambulatory Visit: Payer: Self-pay

## 2019-06-13 ENCOUNTER — Ambulatory Visit (INDEPENDENT_AMBULATORY_CARE_PROVIDER_SITE_OTHER): Payer: Medicare HMO

## 2019-06-13 ENCOUNTER — Encounter: Payer: Self-pay | Admitting: Orthopaedic Surgery

## 2019-06-13 ENCOUNTER — Ambulatory Visit (INDEPENDENT_AMBULATORY_CARE_PROVIDER_SITE_OTHER): Payer: Medicare HMO | Admitting: Orthopaedic Surgery

## 2019-06-13 DIAGNOSIS — S8254XD Nondisplaced fracture of medial malleolus of right tibia, subsequent encounter for closed fracture with routine healing: Secondary | ICD-10-CM

## 2019-06-13 DIAGNOSIS — M25572 Pain in left ankle and joints of left foot: Secondary | ICD-10-CM | POA: Diagnosis not present
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
## 2019-07-31 ENCOUNTER — Other Ambulatory Visit: Payer: Self-pay | Admitting: Primary Care

## 2019-07-31 DIAGNOSIS — I10 Essential (primary) hypertension: Secondary | ICD-10-CM

## 2019-08-30 DIAGNOSIS — I8312 Varicose veins of left lower extremity with inflammation: Secondary | ICD-10-CM | POA: Diagnosis not present

## 2019-08-30 DIAGNOSIS — I83813 Varicose veins of bilateral lower extremities with pain: Secondary | ICD-10-CM | POA: Diagnosis not present

## 2019-08-30 DIAGNOSIS — I8311 Varicose veins of right lower extremity with inflammation: Secondary | ICD-10-CM | POA: Diagnosis not present

## 2019-08-30 DIAGNOSIS — I83893 Varicose veins of bilateral lower extremities with other complications: Secondary | ICD-10-CM | POA: Diagnosis not present

## 2019-08-30 DIAGNOSIS — I890 Lymphedema, not elsewhere classified: Secondary | ICD-10-CM | POA: Diagnosis not present

## 2019-08-31 DIAGNOSIS — I8311 Varicose veins of right lower extremity with inflammation: Secondary | ICD-10-CM | POA: Diagnosis not present

## 2019-08-31 DIAGNOSIS — I83813 Varicose veins of bilateral lower extremities with pain: Secondary | ICD-10-CM | POA: Diagnosis not present

## 2019-08-31 DIAGNOSIS — I83893 Varicose veins of bilateral lower extremities with other complications: Secondary | ICD-10-CM | POA: Diagnosis not present

## 2019-08-31 DIAGNOSIS — I890 Lymphedema, not elsewhere classified: Secondary | ICD-10-CM | POA: Diagnosis not present

## 2019-08-31 DIAGNOSIS — I8312 Varicose veins of left lower extremity with inflammation: Secondary | ICD-10-CM | POA: Diagnosis not present

## 2019-09-13 DIAGNOSIS — I8312 Varicose veins of left lower extremity with inflammation: Secondary | ICD-10-CM | POA: Diagnosis not present

## 2019-09-13 DIAGNOSIS — I83893 Varicose veins of bilateral lower extremities with other complications: Secondary | ICD-10-CM | POA: Diagnosis not present

## 2019-09-13 DIAGNOSIS — I8311 Varicose veins of right lower extremity with inflammation: Secondary | ICD-10-CM | POA: Diagnosis not present

## 2019-09-26 ENCOUNTER — Ambulatory Visit
Admission: RE | Admit: 2019-09-26 | Discharge: 2019-09-26 | Disposition: A | Discharge status: Home / Self Care | Payer: Medicare HMO | Source: Ambulatory Visit | Attending: Primary Care | Admitting: Primary Care

## 2019-09-26 DIAGNOSIS — Z1231 Encounter for screening mammogram for malignant neoplasm of breast: Secondary | ICD-10-CM | POA: Insufficient documentation

## 2019-09-26 DIAGNOSIS — Z1239 Encounter for other screening for malignant neoplasm of breast: Secondary | ICD-10-CM

## 2019-09-27 ENCOUNTER — Telehealth: Payer: Self-pay

## 2019-09-27 NOTE — Telephone Encounter (Signed)
Patient came by and dropped off handicap form to be filled out. Please call patient when ready. Placed form in [place].

## 2019-09-27 NOTE — Telephone Encounter (Signed)
Please kindly ask patient for the reason for her handicap form request.

## 2019-09-28 ENCOUNTER — Other Ambulatory Visit: Payer: Self-pay | Admitting: Primary Care

## 2019-09-28 ENCOUNTER — Inpatient Hospital Stay
Admission: RE | Admit: 2019-09-28 | Discharge: 2019-09-28 | Disposition: A | Discharge status: Home / Self Care | Payer: Self-pay | Source: Ambulatory Visit | Attending: Primary Care | Admitting: Primary Care

## 2019-09-28 DIAGNOSIS — Z1231 Encounter for screening mammogram for malignant neoplasm of breast: Secondary | ICD-10-CM

## 2019-09-28 DIAGNOSIS — I8312 Varicose veins of left lower extremity with inflammation: Secondary | ICD-10-CM | POA: Diagnosis not present

## 2019-09-28 DIAGNOSIS — I83812 Varicose veins of left lower extremities with pain: Secondary | ICD-10-CM | POA: Diagnosis not present

## 2019-09-28 NOTE — Telephone Encounter (Signed)
Completed and placed in [place] inbox. 

## 2019-09-28 NOTE — Telephone Encounter (Signed)
Pt said due to veins in her legs and ankles swelling and lower back pain due to osteoarthritis and pt cannot walk  But short distances.  Pt request cb when completed.

## 2019-09-28 NOTE — Telephone Encounter (Signed)
Message left for patient to return my call on 09/27/2019  Tried to speak to patient but she unable to speak to me, she is busy.

## 2019-09-30 ENCOUNTER — Other Ambulatory Visit: Payer: Self-pay

## 2019-09-30 ENCOUNTER — Emergency Department
Admission: EM | Admit: 2019-09-30 | Discharge: 2019-09-30 | Disposition: A | Discharge status: Home / Self Care | Payer: Medicare HMO | Attending: Emergency Medicine | Admitting: Emergency Medicine

## 2019-09-30 DIAGNOSIS — Z85828 Personal history of other malignant neoplasm of skin: Secondary | ICD-10-CM | POA: Insufficient documentation

## 2019-09-30 DIAGNOSIS — S199XXA Unspecified injury of neck, initial encounter: Secondary | ICD-10-CM | POA: Diagnosis present

## 2019-09-30 DIAGNOSIS — Y9389 Activity, other specified: Secondary | ICD-10-CM | POA: Insufficient documentation

## 2019-09-30 DIAGNOSIS — Z79899 Other long term (current) drug therapy: Secondary | ICD-10-CM | POA: Insufficient documentation

## 2019-09-30 DIAGNOSIS — Y92093 Driveway of other non-institutional residence as the place of occurrence of the external cause: Secondary | ICD-10-CM | POA: Diagnosis not present

## 2019-09-30 DIAGNOSIS — I10 Essential (primary) hypertension: Secondary | ICD-10-CM | POA: Diagnosis not present

## 2019-09-30 DIAGNOSIS — S39012A Strain of muscle, fascia and tendon of lower back, initial encounter: Secondary | ICD-10-CM | POA: Diagnosis not present

## 2019-09-30 DIAGNOSIS — Y999 Unspecified external cause status: Secondary | ICD-10-CM | POA: Insufficient documentation

## 2019-09-30 DIAGNOSIS — S161XXA Strain of muscle, fascia and tendon at neck level, initial encounter: Secondary | ICD-10-CM

## 2019-09-30 MED ORDER — METHOCARBAMOL 500 MG PO TABS: 500.0000 mg | ORAL_TABLET | Freq: Three times a day (TID) | ORAL | 1 refills | Status: DC

## 2019-09-30 MED ORDER — HYDROCODONE-ACETAMINOPHEN 5-325 MG PO TABS
1.0000 | ORAL_TABLET | ORAL | Status: AC
  Administered 2019-09-30: 1 via ORAL
  Filled 2019-09-30: qty 1

## 2019-09-30 MED ORDER — HYDROCODONE-ACETAMINOPHEN 5-325 MG PO TABS: 1.0000 | ORAL_TABLET | ORAL | 0 refills | Status: DC | PRN

## 2019-09-30 MED ORDER — METHOCARBAMOL 500 MG PO TABS
500.0000 mg | ORAL_TABLET | Freq: Once | ORAL | Status: AC
  Administered 2019-09-30: 500 mg via ORAL
  Filled 2019-09-30: qty 1
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
## 2019-09-30 NOTE — Discharge Instructions (Addendum)
Please apply ice to the neck and lower back 20 minutes every hour for the next 2 days then transition over to heat.  You may use Tylenol for mild to moderate pain relief.  Use Norco and methocarbamol as prescribed for severe pain.  If no improvement 1 week follow-up with your primary care physician or orthopedics.

## 2019-09-30 NOTE — ED Notes (Signed)
Pt ambulatory to treatment room- NAD noted- provider at bedside

## 2019-10-01 ENCOUNTER — Telehealth: Payer: Self-pay

## 2019-10-01 ENCOUNTER — Other Ambulatory Visit: Payer: Self-pay | Admitting: Primary Care

## 2019-10-01 DIAGNOSIS — R928 Other abnormal and inconclusive findings on diagnostic imaging of breast: Secondary | ICD-10-CM

## 2019-10-01 NOTE — Telephone Encounter (Signed)
Noted, ED chart reviewed.

## 2019-10-01 NOTE — Telephone Encounter (Signed)
Per chart review tab pt was at [facility] ED on 09/30/19.

## 2019-10-01 NOTE — Telephone Encounter (Signed)
Per DPR, left detail message of Tawni Millers comments for patient. Form left in the front office for pick up.
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
## 2019-10-05 DIAGNOSIS — I8312 Varicose veins of left lower extremity with inflammation: Secondary | ICD-10-CM | POA: Diagnosis not present

## 2019-10-08 ENCOUNTER — Other Ambulatory Visit: Payer: Self-pay

## 2019-10-08 ENCOUNTER — Encounter: Payer: Self-pay | Admitting: Primary Care

## 2019-10-08 ENCOUNTER — Ambulatory Visit (INDEPENDENT_AMBULATORY_CARE_PROVIDER_SITE_OTHER): Payer: Medicare HMO | Admitting: Primary Care

## 2019-10-08 ENCOUNTER — Ambulatory Visit (INDEPENDENT_AMBULATORY_CARE_PROVIDER_SITE_OTHER)
Admission: RE | Admit: 2019-10-08 | Discharge: 2019-10-08 | Disposition: A | Discharge status: Home / Self Care | Payer: Medicare HMO | Source: Ambulatory Visit | Attending: Primary Care | Admitting: Primary Care

## 2019-10-08 VITALS — BP 136/82 | HR 78 | Temp 97.8°F | Ht 64.0 in | Wt 255.8 lb

## 2019-10-08 DIAGNOSIS — M79671 Pain in right foot: Secondary | ICD-10-CM | POA: Diagnosis not present

## 2019-10-08 DIAGNOSIS — S99921A Unspecified injury of right foot, initial encounter: Secondary | ICD-10-CM | POA: Diagnosis not present

## 2019-10-08 DIAGNOSIS — M7989 Other specified soft tissue disorders: Secondary | ICD-10-CM | POA: Diagnosis not present

## 2019-10-08 HISTORY — DX: Pain in right foot: M79.671
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
## 2019-10-08 NOTE — Patient Instructions (Signed)
Complete xray(s) prior to leaving today. I will notify you of your results once received.  Elevate your foot when resting.  Wear comfortable and supportive shoes when walking.  It was a pleasure to see you today!

## 2019-10-09 ENCOUNTER — Other Ambulatory Visit: Payer: Self-pay | Admitting: Primary Care

## 2019-10-09 DIAGNOSIS — M545 Low back pain, unspecified: Secondary | ICD-10-CM

## 2019-10-09 DIAGNOSIS — M542 Cervicalgia: Secondary | ICD-10-CM

## 2019-10-09 DIAGNOSIS — M79671 Pain in right foot: Secondary | ICD-10-CM

## 2019-10-16 DIAGNOSIS — I83892 Varicose veins of left lower extremities with other complications: Secondary | ICD-10-CM | POA: Diagnosis not present

## 2019-10-16 DIAGNOSIS — I83812 Varicose veins of left lower extremities with pain: Secondary | ICD-10-CM | POA: Diagnosis not present

## 2019-10-16 DIAGNOSIS — I8312 Varicose veins of left lower extremity with inflammation: Secondary | ICD-10-CM | POA: Diagnosis not present

## 2019-10-19 DIAGNOSIS — M545 Low back pain: Secondary | ICD-10-CM | POA: Diagnosis not present

## 2019-10-19 DIAGNOSIS — M542 Cervicalgia: Secondary | ICD-10-CM | POA: Diagnosis not present

## 2019-10-22 DIAGNOSIS — I8312 Varicose veins of left lower extremity with inflammation: Secondary | ICD-10-CM | POA: Diagnosis not present

## 2019-10-23 DIAGNOSIS — M545 Low back pain: Secondary | ICD-10-CM | POA: Diagnosis not present

## 2019-10-23 DIAGNOSIS — M542 Cervicalgia: Secondary | ICD-10-CM | POA: Diagnosis not present

## 2019-10-30 DIAGNOSIS — M545 Low back pain: Secondary | ICD-10-CM | POA: Diagnosis not present

## 2019-10-30 DIAGNOSIS — M542 Cervicalgia: Secondary | ICD-10-CM | POA: Diagnosis not present

## 2019-11-01 DIAGNOSIS — I8312 Varicose veins of left lower extremity with inflammation: Secondary | ICD-10-CM | POA: Diagnosis not present

## 2019-11-01 DIAGNOSIS — I83892 Varicose veins of left lower extremities with other complications: Secondary | ICD-10-CM | POA: Diagnosis not present

## 2019-11-06 DIAGNOSIS — M545 Low back pain: Secondary | ICD-10-CM | POA: Diagnosis not present

## 2019-11-06 DIAGNOSIS — M542 Cervicalgia: Secondary | ICD-10-CM | POA: Diagnosis not present

## 2019-11-14 DIAGNOSIS — M542 Cervicalgia: Secondary | ICD-10-CM | POA: Diagnosis not present

## 2019-11-14 DIAGNOSIS — M545 Low back pain: Secondary | ICD-10-CM | POA: Diagnosis not present

## 2019-11-20 DIAGNOSIS — M545 Low back pain: Secondary | ICD-10-CM | POA: Diagnosis not present

## 2019-11-20 DIAGNOSIS — M542 Cervicalgia: Secondary | ICD-10-CM | POA: Diagnosis not present

## 2019-11-21 DIAGNOSIS — M542 Cervicalgia: Secondary | ICD-10-CM | POA: Diagnosis not present

## 2019-11-21 DIAGNOSIS — M545 Low back pain: Secondary | ICD-10-CM | POA: Diagnosis not present

## 2019-11-22 DIAGNOSIS — I8312 Varicose veins of left lower extremity with inflammation: Secondary | ICD-10-CM | POA: Diagnosis not present

## 2019-11-22 DIAGNOSIS — I83812 Varicose veins of left lower extremities with pain: Secondary | ICD-10-CM | POA: Diagnosis not present

## 2019-11-27 DIAGNOSIS — M542 Cervicalgia: Secondary | ICD-10-CM | POA: Diagnosis not present

## 2019-11-27 DIAGNOSIS — M545 Low back pain: Secondary | ICD-10-CM | POA: Diagnosis not present

## 2019-12-10 DIAGNOSIS — I8312 Varicose veins of left lower extremity with inflammation: Secondary | ICD-10-CM | POA: Diagnosis not present

## 2019-12-17 DIAGNOSIS — M545 Low back pain: Secondary | ICD-10-CM | POA: Diagnosis not present

## 2019-12-17 DIAGNOSIS — M542 Cervicalgia: Secondary | ICD-10-CM | POA: Diagnosis not present

## 2019-12-25 DIAGNOSIS — M545 Low back pain: Secondary | ICD-10-CM | POA: Diagnosis not present

## 2019-12-25 DIAGNOSIS — M542 Cervicalgia: Secondary | ICD-10-CM | POA: Diagnosis not present

## 2019-12-26 DIAGNOSIS — I8311 Varicose veins of right lower extremity with inflammation: Secondary | ICD-10-CM | POA: Diagnosis not present

## 2020-01-04 DIAGNOSIS — M545 Low back pain: Secondary | ICD-10-CM | POA: Diagnosis not present

## 2020-01-04 DIAGNOSIS — M542 Cervicalgia: Secondary | ICD-10-CM | POA: Diagnosis not present

## 2020-01-08 DIAGNOSIS — M542 Cervicalgia: Secondary | ICD-10-CM | POA: Diagnosis not present

## 2020-01-08 DIAGNOSIS — M545 Low back pain: Secondary | ICD-10-CM | POA: Diagnosis not present

## 2020-01-09 DIAGNOSIS — I83811 Varicose veins of right lower extremities with pain: Secondary | ICD-10-CM | POA: Diagnosis not present

## 2020-01-09 DIAGNOSIS — I8311 Varicose veins of right lower extremity with inflammation: Secondary | ICD-10-CM | POA: Diagnosis not present

## 2020-01-22 ENCOUNTER — Telehealth: Payer: Self-pay | Admitting: Primary Care

## 2020-01-22 ENCOUNTER — Encounter: Payer: Self-pay | Admitting: Primary Care

## 2020-01-22 NOTE — Telephone Encounter (Signed)
Message left for patient to return my call.  

## 2020-01-22 NOTE — Telephone Encounter (Signed)
Patient called and was inform by [facility] regarding letter.

## 2020-01-22 NOTE — Telephone Encounter (Signed)
Noted, letter printed.

## 2020-01-22 NOTE — Telephone Encounter (Signed)
Message left for patient to return my call.  Need more detail regarding this. What information is needed for social security?   The OV on 10/08/2019 was for a right foot pain and ED follow up.

## 2020-01-22 NOTE — Telephone Encounter (Signed)
Please advise 

## 2020-01-22 NOTE — Telephone Encounter (Signed)
Pt called stating social security needs a letter showing that  she was in office 09/2019 for identification purposes.  Please let pt know when this is ready for pick up

## 2020-01-22 NOTE — Telephone Encounter (Signed)
Pt returned you call She stated she needed letter for identification purposes.  She is in process of divorce She cannot send her driver license  Because she needs them to drive and they told her to contact dr office for name change for social security and then driver license

## 2020-01-22 NOTE — Telephone Encounter (Signed)
Left letter in the front office for pick up

## 2020-01-29 ENCOUNTER — Other Ambulatory Visit: Payer: Self-pay | Admitting: Primary Care

## 2020-01-29 DIAGNOSIS — I10 Essential (primary) hypertension: Secondary | ICD-10-CM

## 2020-01-30 ENCOUNTER — Other Ambulatory Visit: Payer: Self-pay | Admitting: Primary Care

## 2020-01-30 DIAGNOSIS — K219 Gastro-esophageal reflux disease without esophagitis: Secondary | ICD-10-CM

## 2020-01-30 NOTE — Telephone Encounter (Signed)
Ann Barker, can you call patient to schedule [name] for me? Thanks

## 2020-01-30 NOTE — Telephone Encounter (Signed)
Called to schedule. Patient says her car is in the shop and she wants to call back when she gets it back to get this scheduled.

## 2020-02-25 ENCOUNTER — Other Ambulatory Visit: Payer: Self-pay | Admitting: Primary Care

## 2020-02-25 DIAGNOSIS — I10 Essential (primary) hypertension: Secondary | ICD-10-CM

## 2020-02-25 DIAGNOSIS — K219 Gastro-esophageal reflux disease without esophagitis: Secondary | ICD-10-CM

## 2020-03-11 ENCOUNTER — Other Ambulatory Visit: Payer: Self-pay | Admitting: Primary Care

## 2020-03-11 DIAGNOSIS — K219 Gastro-esophageal reflux disease without esophagitis: Secondary | ICD-10-CM

## 2020-03-11 DIAGNOSIS — I10 Essential (primary) hypertension: Secondary | ICD-10-CM

## 2020-03-11 DIAGNOSIS — R7303 Prediabetes: Secondary | ICD-10-CM

## 2020-03-11 DIAGNOSIS — E559 Vitamin D deficiency, unspecified: Secondary | ICD-10-CM

## 2020-03-11 DIAGNOSIS — E785 Hyperlipidemia, unspecified: Secondary | ICD-10-CM

## 2020-03-11 DIAGNOSIS — D649 Anemia, unspecified: Secondary | ICD-10-CM

## 2020-03-19 ENCOUNTER — Ambulatory Visit (INDEPENDENT_AMBULATORY_CARE_PROVIDER_SITE_OTHER): Payer: Medicare HMO

## 2020-03-19 ENCOUNTER — Other Ambulatory Visit: Payer: Self-pay

## 2020-03-19 ENCOUNTER — Other Ambulatory Visit (INDEPENDENT_AMBULATORY_CARE_PROVIDER_SITE_OTHER): Payer: Medicare HMO

## 2020-03-19 DIAGNOSIS — R7303 Prediabetes: Secondary | ICD-10-CM

## 2020-03-19 DIAGNOSIS — E785 Hyperlipidemia, unspecified: Secondary | ICD-10-CM | POA: Diagnosis not present

## 2020-03-19 DIAGNOSIS — Z0000 Encounter for general adult medical examination without abnormal findings: Secondary | ICD-10-CM | POA: Diagnosis not present

## 2020-03-19 DIAGNOSIS — D649 Anemia, unspecified: Secondary | ICD-10-CM | POA: Diagnosis not present

## 2020-03-19 DIAGNOSIS — E559 Vitamin D deficiency, unspecified: Secondary | ICD-10-CM

## 2020-03-19 LAB — CBC
HCT: 35.2 % — ABNORMAL LOW (ref 36.0–46.0)
Hemoglobin: 11.4 g/dL — ABNORMAL LOW (ref 12.0–15.0)
MCHC: 32.5 g/dL (ref 30.0–36.0)
MCV: 86.7 fl (ref 78.0–100.0)
Platelets: 241 10*3/uL (ref 150.0–400.0)
RBC: 4.06 Mil/uL (ref 3.87–5.11)
RDW: 14.7 % (ref 11.5–15.5)
WBC: 8.6 10*3/uL (ref 4.0–10.5)

## 2020-03-19 LAB — VITAMIN D 25 HYDROXY (VIT D DEFICIENCY, FRACTURES): VITD: 35.37 ng/mL (ref 30.00–100.00)

## 2020-03-19 LAB — COMPREHENSIVE METABOLIC PANEL
ALT: 17 U/L (ref 0–35)
AST: 15 U/L (ref 0–37)
Albumin: 3.7 g/dL (ref 3.5–5.2)
Alkaline Phosphatase: 73 U/L (ref 39–117)
BUN: 20 mg/dL (ref 6–23)
CO2: 29 mEq/L (ref 19–32)
Calcium: 8.9 mg/dL (ref 8.4–10.5)
Chloride: 108 mEq/L (ref 96–112)
Creatinine, Ser: 0.92 mg/dL (ref 0.40–1.20)
GFR: 72.88 mL/min (ref >60.00)
Glucose, Bld: 97 mg/dL (ref 70–99)
Potassium: 3.8 mEq/L (ref 3.5–5.1)
Sodium: 143 mEq/L (ref 135–145)
Total Bilirubin: 0.5 mg/dL (ref 0.2–1.2)
Total Protein: 6.4 g/dL (ref 6.0–8.3)

## 2020-03-19 LAB — LIPID PANEL
Cholesterol: 168 mg/dL (ref 0–200)
HDL: 59.7 mg/dL (ref >39.00)
LDL Cholesterol: 80 mg/dL (ref 0–99)
NonHDL: 108.67
Total CHOL/HDL Ratio: 3
Triglycerides: 143 mg/dL (ref 0.0–149.0)
VLDL: 28.6 mg/dL (ref 0.0–40.0)

## 2020-03-19 LAB — HEMOGLOBIN A1C: Hgb A1c MFr Bld: 5.8 % (ref 4.6–6.5)

## 2020-03-19 NOTE — Patient Instructions (Signed)
[name] , Thank you for taking time to come for your Medicare Wellness Visit. I appreciate your ongoing commitment to your health goals. Please review the following plan we discussed and let me know if I can assist you in the future.   Screening recommendations/referrals: Colonoscopy: Up to date, completed 02/20/2019 Mammogram: Up to date, completed 09/26/2019 Bone Density: due Recommended yearly ophthalmology/optometry visit for glaucoma screening and checkup Recommended yearly dental visit for hygiene and checkup  Vaccinations: Influenza vaccine: Fall 2021 Pneumococcal vaccine: Prevnar 13 due  Tdap vaccine: Up to date, completed 08/01/2016 Shingles vaccine: discussed    Advanced directives: Advance directive discussed with you today. Even though you declined this today please call our office should you change your mind and we can give you the proper paperwork for you to fill out.  Conditions/risks identified: hypertension, hyperlipidemia  Next appointment: 03/24/2020 @ 11:20 am    Preventive Care 65 Years and Older, Female Preventive care refers to lifestyle choices and visits with your health care provider that can promote health and wellness. What does preventive care include?  A yearly physical exam. This is also called an annual well check.  Dental exams once or twice a year.  Routine eye exams. Ask your health care provider how often you should have your eyes checked.  Personal lifestyle choices, including:  Daily care of your teeth and gums.  Regular physical activity.  Eating a healthy diet.  Avoiding tobacco and drug use.  Limiting alcohol use.  Practicing safe sex.  Taking low-dose aspirin every day.  Taking vitamin and mineral supplements as recommended by your health care provider. What happens during an annual well check? The services and screenings done by your health care provider during your annual well check will depend on your age, overall  health, lifestyle risk factors, and family history of disease. Counseling  Your health care provider may ask you questions about your:  Alcohol use.  Tobacco use.  Drug use.  Emotional well-being.  Home and relationship well-being.  Sexual activity.  Eating habits.  History of falls.  Memory and ability to understand (cognition).  Work and work Statistician.  Reproductive health. Screening  You may have the following tests or measurements:  Height, weight, and BMI.  Blood pressure.  Lipid and cholesterol levels. These may be checked every 5 years, or more frequently if you are over 64 years old.  Skin check.  Lung cancer screening. You may have this screening every year starting at age 38 if you have a 30-pack-year history of smoking and currently smoke or have quit within the past 15 years.  Fecal occult blood test (FOBT) of the stool. You may have this test every year starting at age 56.  Flexible sigmoidoscopy or colonoscopy. You may have a sigmoidoscopy every 5 years or a colonoscopy every 10 years starting at age 53.  Hepatitis C blood test.  Hepatitis B blood test.  Sexually transmitted disease (STD) testing.  Diabetes screening. This is done by checking your blood sugar (glucose) after you have not eaten for a while (fasting). You may have this done every 1-3 years.  Bone density scan. This is done to screen for osteoporosis. You may have this done starting at age 53.  Mammogram. This may be done every 1-2 years. Talk to your health care provider about how often you should have regular mammograms. Talk with your health care provider about your test results, treatment options, and if necessary, the need for more tests. Vaccines  Your health care provider may recommend certain vaccines, such as:  Influenza vaccine. This is recommended every year.  Tetanus, diphtheria, and acellular pertussis (Tdap, Td) vaccine. You may need a Td booster every 10  years.  Zoster vaccine. You may need this after age 71.  Pneumococcal 13-valent conjugate (PCV13) vaccine. One dose is recommended after age 14.  Pneumococcal polysaccharide (PPSV23) vaccine. One dose is recommended after age 45. Talk to your health care provider about which screenings and vaccines you need and how often you need them. This information is not intended to replace advice given to you by your health care provider. Make sure you discuss any questions you have with your health care provider. Document Released: 12/26/2015 Document Revised: 08/18/2016 Document Reviewed: 09/30/2015 Elsevier Interactive Patient Education  2017 [place] Prevention in the Home Falls can cause injuries. They can happen to people of all ages. There are many things you can do to make your home safe and to help prevent falls. What can I do on the outside of my home?  Regularly fix the edges of walkways and driveways and fix any cracks.  Remove anything that might make you trip as you walk through a door, such as a raised step or threshold.  Trim any bushes or trees on the path to your home.  Use bright outdoor lighting.  Clear any walking paths of anything that might make someone trip, such as rocks or tools.  Regularly check to see if handrails are loose or broken. Make sure that both sides of any steps have handrails.  Any raised decks and porches should have guardrails on the edges.  Have any leaves, snow, or ice cleared regularly.  Use sand or salt on walking paths during winter.  Clean up any spills in your garage right away. This includes oil or grease spills. What can I do in the bathroom?  Use night lights.  Install grab bars by the toilet and in the tub and shower. Do not use towel bars as grab bars.  Use non-skid mats or decals in the tub or shower.  If you need to sit down in the shower, use a plastic, non-slip stool.  Keep the floor dry. Clean up any water that  spills on the floor as soon as it happens.  Remove soap buildup in the tub or shower regularly.  Attach bath mats securely with double-sided non-slip rug tape.  Do not have throw rugs and other things on the floor that can make you trip. What can I do in the bedroom?  Use night lights.  Make sure that you have a light by your bed that is easy to reach.  Do not use any sheets or blankets that are too big for your bed. They should not hang down onto the floor.  Have a firm chair that has side arms. You can use this for support while you get dressed.  Do not have throw rugs and other things on the floor that can make you trip. What can I do in the kitchen?  Clean up any spills right away.  Avoid walking on wet floors.  Keep items that you use a lot in easy-to-reach places.  If you need to reach something above you, use a strong step stool that has a grab bar.  Keep electrical cords out of the way.  Do not use floor polish or wax that makes floors slippery. If you must use wax, use non-skid floor wax.  Do  not have throw rugs and other things on the floor that can make you trip. What can I do with my stairs?  Do not leave any items on the stairs.  Make sure that there are handrails on both sides of the stairs and use them. Fix handrails that are broken or loose. Make sure that handrails are as long as the stairways.  Check any carpeting to make sure that it is firmly attached to the stairs. Fix any carpet that is loose or worn.  Avoid having throw rugs at the top or bottom of the stairs. If you do have throw rugs, attach them to the floor with carpet tape.  Make sure that you have a light switch at the top of the stairs and the bottom of the stairs. If you do not have them, ask someone to add them for you. What else can I do to help prevent falls?  Wear shoes that:  Do not have high heels.  Have rubber bottoms.  Are comfortable and fit you well.  Are closed at the  toe. Do not wear sandals.  If you use a stepladder:  Make sure that it is fully opened. Do not climb a closed stepladder.  Make sure that both sides of the stepladder are locked into place.  Ask someone to hold it for you, if possible.  Clearly mark and make sure that you can see:  Any grab bars or handrails.  First and last steps.  Where the edge of each step is.  Use tools that help you move around (mobility aids) if they are needed. These include:  Canes.  Walkers.  Scooters.  Crutches.  Turn on the lights when you go into a dark area. Replace any light bulbs as soon as they burn out.  Set up your furniture so you have a clear path. Avoid moving your furniture around.  If any of your floors are uneven, fix them.  If there are any pets around you, be aware of where they are.  Review your medicines with your doctor. Some medicines can make you feel dizzy. This can increase your chance of falling. Ask your doctor what other things that you can do to help prevent falls. This information is not intended to replace advice given to you by your health care provider. Make sure you discuss any questions you have with your health care provider. Document Released: 09/25/2009 Document Revised: 05/06/2016 Document Reviewed: 01/03/2015 Elsevier Interactive Patient Education  2017 [employer].

## 2020-03-19 NOTE — Progress Notes (Signed)
PCP notes:  Health Maintenance: Prevnar 13 due Dexa- due   Abnormal Screenings: none   Patient concerns: none   Nurse concerns: none   Next PCP appt: 03/24/2020 @ 11:20 am

## 2020-03-19 NOTE — Progress Notes (Signed)
Subjective:   Ann Barker is a 69 y.o. female who presents for Medicare Annual (Subsequent) preventive examination.  Review of Systems: N/A   This visit is being conducted through telemedicine via telephone at the nurse health advisor's home address due to the COVID-19 pandemic. This patient has given me verbal consent via doximity to conduct this visit, patient states they are participating from their home address. Patient and myself are on the telephone call. There is no referral for this visit. Some vital signs may be absent or patient reported.    Patient identification: identified by name, DOB, and current address   Cardiac Risk Factors include: advanced age (>61men, >85 women);hypertension;dyslipidemia     Objective:     Vitals: There were no vitals taken for this visit.  There is no height or weight on file to calculate BMI.  Advanced Directives 03/19/2020 09/30/2019 02/20/2019 11/14/2018 07/28/2018 04/25/2018 04/10/2018  Does Patient Have a Medical Advance Directive? No No No No No No No  Does patient want to make changes to medical advance directive? No - Patient declined - - - - - -  Would patient like information on creating a medical advance directive? - - No - Patient declined No - Patient declined - - No - Patient declined    Tobacco Social History   Tobacco Use  Smoking Status Never Smoker  Smokeless Tobacco Never Used     Counseling given: Not Answered   Clinical Intake:  Pre-visit preparation completed: Yes  Pain : No/denies pain     Nutritional Risks: None Diabetes: No  How often do you need to have someone help you when you read instructions, pamphlets, or other written materials from your doctor or pharmacy?: 1 - Never What is the last grade level you completed in school?: 1 year of college  Interpreter Needed?: No  Information entered by :: CJohnson, LPN  Past Medical History:  Diagnosis Date  . Acid reflux   . Anemia   . Arthritis   .  Cancer ([place]) 1993   SKIN CANCER  . Complication of anesthesia    HARD TO WAKE UP  . Concussion 01/2017  . Dizziness   . Family history of adverse reaction to anesthesia    PT UNSURE OF FAMILY HISTORY  . Gallstones   . Hematuria    microscopic  . History of hiatal hernia   . History of kidney stones    H/O  . HLD (hyperlipidemia)   . Hoarseness   . Hypertension   . Lower extremity edema    pt states this is chronic and has been told she has lymph edema-wears compression stockings  . Lower leg edema   . Over weight   . PONV (postoperative nausea and vomiting)   . Renal cyst   . Seizures (HCC)    AS A CHILD AND THEN WITH 1 PREGNANCY BUT NONE SINCE  . Sleep apnea    NO CPAP  . Symptomatic cholelithiasis 04/14/2018  . Urge incontinence   . UTI (lower urinary tract infection)   . Vitamin D deficiency    Past Surgical History:  Procedure Laterality Date  . ABDOMINAL HYSTERECTOMY  1981  . BREAST BIOPSY  1995/2005  . CHOLECYSTECTOMY N/A 05/10/2018   Procedure: LAPAROSCOPIC CHOLECYSTECTOMY;  Surgeon: Vickie Epley, MD;  Location: [facility] ORS;  Service: General;  Laterality: N/A;  . COLONOSCOPY WITH PROPOFOL N/A 02/20/2019   Procedure: COLONOSCOPY WITH PROPOFOL;  Surgeon: Lucilla Lame, MD;  Location: [facility] ENDOSCOPY;  Service: Endoscopy;  Laterality: N/A;  . HERNIA REPAIR  2005  . LAPAROSCOPIC LYSIS OF ADHESIONS N/A 05/10/2018   Procedure: LAPAROSCOPIC LYSIS OF ADHESIONS;  Surgeon: Vickie Epley, MD;  Location: [facility] ORS;  Service: General;  Laterality: N/A;  . TUBAL LIGATION  1975   Family History  Problem Relation Age of Onset  . Cancer Paternal Aunt   . Stroke Paternal Uncle   . Prostate cancer Neg Hx   . Kidney disease Neg Hx   . Bladder Cancer Neg Hx    Social History   Socioeconomic History  . Marital status: Married    Spouse name: Not on file  . Number of children: Not on file  . Years of education: Not on file  . Highest education level: Not on file   Occupational History  . Not on file  Tobacco Use  . Smoking status: Never Smoker  . Smokeless tobacco: Never Used  Substance and Sexual Activity  . Alcohol use: No    Alcohol/week: 0.0 standard drinks  . Drug use: No  . Sexual activity: Not on file  Other Topics Concern  . Not on file  Social History Narrative  . Not on file   Social Determinants of Health   Financial Resource Strain: Low Risk   . Difficulty of Paying Living Expenses: Not hard at all  Food Insecurity: No Food Insecurity  . Worried About Charity fundraiser in the Last Year: Never true  . Ran Out of Food in the Last Year: Never true  Transportation Needs: No Transportation Needs  . Lack of Transportation (Medical): No  . Lack of Transportation (Non-Medical): No  Physical Activity: Inactive  . Days of Exercise per Week: 0 days  . Minutes of Exercise per Session: 0 min  Stress: Stress Concern Present  . Feeling of Stress : To some extent  Social Connections:   . Frequency of Communication with Friends and Family:   . Frequency of Social Gatherings with Friends and Family:   . Attends Religious Services:   . Active Member of Clubs or Organizations:   . Attends Archivist Meetings:   Marland Kitchen Marital Status:     Outpatient Encounter Medications as of 03/19/2020  Medication Sig  . amLODipine (NORVASC) 10 MG tablet TAKE 1 TABLET (10 MG TOTAL) BY MOUTH DAILY. NEED APPOINTMENT FOR ANY MORE REFILLS  . cholecalciferol (VITAMIN D) 1000 units tablet Take 1,000 Units by mouth daily.  . folic acid (FOLVITE) Q000111Q MCG tablet Take 800 mcg by mouth daily.  Marland Kitchen HYDROcodone-acetaminophen (NORCO) 5-325 MG tablet Take 1 tablet by mouth every 4 (four) hours as needed for moderate pain.  . methocarbamol (ROBAXIN) 500 MG tablet Take 1 tablet (500 mg total) by mouth 3 (three) times daily.  . Multiple Vitamin (MULTIVITAMIN) capsule Take 1 capsule by mouth daily.  Marland Kitchen omeprazole (PRILOSEC) 40 MG capsule TAKE 1 CAPSULE (40 MG TOTAL) BY  MOUTH AT BEDTIME. NEED APPOINTMENT FOR ANY MORE REFILLS   No facility-administered encounter medications on file as of 03/19/2020.    Activities of Daily Living In your present state of health, do you have any difficulty performing the following activities: 03/19/2020  Hearing? N  Vision? N  Difficulty concentrating or making decisions? N  Walking or climbing stairs? N  Dressing or bathing? N  Doing errands, shopping? N  Preparing Food and eating ? N  Using the Toilet? N  In the past six months, have you accidently leaked urine? Y  Comment  wears pads sometimes  Do you have problems with loss of bowel control? Y  Comment wears pads sometimes  Managing your Medications? N  Managing your Finances? N  Housekeeping or managing your Housekeeping? N  Some recent data might be hidden    Patient Care Team: Pleas Koch, NP as PCP - General (Nurse Practitioner)    Assessment:   This is a routine wellness examination for Lucianne.  Exercise Activities and Dietary recommendations Current Exercise Habits: The patient does not participate in regular exercise at present, Exercise limited by: None identified  Goals    . Increase water intake     Starting 04/08/17, I will continue to drink 3-4 12oz bottles of water daily.     . Patient Stated     03/19/2020, I will maintain and continue medications as prescribed.        Fall Risk Fall Risk  03/19/2020 01/09/2019 04/08/2017  Falls in the past year? 0 0 Yes  Comment - - pt fell against brick  Number falls in past yr: 0 - 1  Injury with Fall? 0 - Yes  Risk for fall due to : Medication side effect - -  Follow up Falls evaluation completed;Falls prevention discussed - -   Is the patient's home free of loose throw rugs in walkways, pet beds, electrical cords, etc?   yes      Grab bars in the bathroom? no      Handrails on the stairs?   yes      Adequate lighting?   yes  Timed Get Up and Go performed: N/A  Depression Screen PHQ 2/9  Scores 03/19/2020 01/18/2019 01/09/2019 04/08/2017  PHQ - 2 Score 0 1 0 0  PHQ- 9 Score 0 7 0 -     Cognitive Function MMSE - Mini Mental State Exam 03/19/2020 01/09/2019 04/08/2017  Orientation to time 5 5 5   Orientation to Place 5 5 5   Registration 3 3 3   Attention/ Calculation 5 0 0  Recall 3 3 3   Language- name 2 objects - 0 0  Language- repeat 1 1 1   Language- follow 3 step command - 3 3  Language- read & follow direction - 0 0  Write a sentence - 0 0  Copy design - 0 0  Total score - 20 20  Mini Cog  Mini-Cog screen was completed. Maximum score is 22. A value of 0 denotes this part of the MMSE was not completed or the patient failed this part of the Mini-Cog screening.       Immunization History  Administered Date(s) Administered  . Pneumococcal Polysaccharide-23 01/18/2019  . Tdap 08/01/2016  . Zoster 12/13/2005    Qualifies for Shingles Vaccine: Yes  Screening Tests Health Maintenance  Topic Date Due  . DEXA SCAN  Never done  . PNA vac Low Risk Adult (2 of 2 - PCV13) 01/19/2020  . INFLUENZA VACCINE  07/13/2020  . MAMMOGRAM  09/25/2021  . COLONOSCOPY  02/20/2024  . TETANUS/TDAP  08/01/2026  . Hepatitis C Screening  Completed    Cancer Screenings: Lung: Low Dose CT Chest recommended if Age 47-80 years, 30 pack-year currently smoking OR have quit w/in 15 years. Patient does not qualify. Breast:  Up to date on Mammogram: Yes, completed 09/26/2019   Up to date of Bone Density/Dexa:  No, Dexa is due Colorectal: completed 02/20/2019  Additional Screenings:  Hepatitis C Screening: 04/08/2017     Plan:   Patient will maintain and continue medications  as prescribed.    I have personally reviewed and noted the following in the patient's chart:   . Medical and social history . Use of alcohol, tobacco or illicit drugs  . Current medications and supplements . Functional ability and status . Nutritional status . Physical activity . Advanced directives . List of other  physicians . Hospitalizations, surgeries, and ER visits in previous 12 months . Vitals . Screenings to include cognitive, depression, and falls . Referrals and appointments  In addition, I have reviewed and discussed with patient certain preventive protocols, quality metrics, and best practice recommendations. A written personalized care plan for preventive services as well as general preventive health recommendations were provided to patient.     Andrez Grime, LPN  [id number]

## 2020-03-24 ENCOUNTER — Other Ambulatory Visit: Payer: Self-pay

## 2020-03-24 ENCOUNTER — Encounter: Payer: Self-pay | Admitting: Primary Care

## 2020-03-24 ENCOUNTER — Ambulatory Visit (INDEPENDENT_AMBULATORY_CARE_PROVIDER_SITE_OTHER): Payer: Medicare HMO | Admitting: Primary Care

## 2020-03-24 DIAGNOSIS — R7303 Prediabetes: Secondary | ICD-10-CM | POA: Diagnosis not present

## 2020-03-24 DIAGNOSIS — N281 Cyst of kidney, acquired: Secondary | ICD-10-CM

## 2020-03-24 DIAGNOSIS — R350 Frequency of micturition: Secondary | ICD-10-CM | POA: Diagnosis not present

## 2020-03-24 DIAGNOSIS — E785 Hyperlipidemia, unspecified: Secondary | ICD-10-CM

## 2020-03-24 DIAGNOSIS — Z0000 Encounter for general adult medical examination without abnormal findings: Secondary | ICD-10-CM

## 2020-03-24 DIAGNOSIS — K635 Polyp of colon: Secondary | ICD-10-CM

## 2020-03-24 DIAGNOSIS — D649 Anemia, unspecified: Secondary | ICD-10-CM | POA: Diagnosis not present

## 2020-03-24 DIAGNOSIS — R3129 Other microscopic hematuria: Secondary | ICD-10-CM | POA: Diagnosis not present

## 2020-03-24 DIAGNOSIS — K219 Gastro-esophageal reflux disease without esophagitis: Secondary | ICD-10-CM

## 2020-03-24 DIAGNOSIS — I10 Essential (primary) hypertension: Secondary | ICD-10-CM | POA: Diagnosis not present

## 2020-03-24 DIAGNOSIS — I890 Lymphedema, not elsewhere classified: Secondary | ICD-10-CM | POA: Diagnosis not present

## 2020-03-24 DIAGNOSIS — Z87448 Personal history of other diseases of urinary system: Secondary | ICD-10-CM

## 2020-03-24 LAB — POC URINALSYSI DIPSTICK (AUTOMATED)
Bilirubin, UA: NEGATIVE
Glucose, UA: NEGATIVE
Ketones, UA: NEGATIVE
Leukocytes, UA: NEGATIVE
Nitrite, UA: NEGATIVE
Protein, UA: NEGATIVE
Spec Grav, UA: 1.025 (ref 1.010–1.025)
Urobilinogen, UA: 0.2 E.U./dL
pH, UA: 6 (ref 5.0–8.0)

## 2020-03-24 MED ORDER — OMEPRAZOLE 20 MG PO CPDR: 20.0000 mg | DELAYED_RELEASE_CAPSULE | Freq: Every day | ORAL | 0 refills | Status: DC

## 2020-03-24 MED ORDER — AMLODIPINE BESYLATE 10 MG PO TABS: 10.0000 mg | ORAL_TABLET | Freq: Every day | ORAL | 3 refills | Status: DC

## 2020-03-24 NOTE — Progress Notes (Signed)
Subjective:    Patient ID: Ann Barker, female    DOB: [birth year], 69 y.o.   MRN: [id number]  HPI  This visit occurred during the SARS-CoV-2 public health emergency.  Safety protocols were in place, including screening questions prior to the visit, additional usage of staff PPE, and extensive cleaning of exam room while observing appropriate contact time as indicated for disinfecting solutions.   Ann Barker is a 69 year old female who presents today for complete physical.  She also has chronic urinary frequency during the day and evening, mostly at night. Will have to get up 4-6 times during the night to urinate. Also with urge urinary incontinence, often wets herself as she doesn't make it to the bathroom in time. Denies caffeine consumption after 12 pm, does not drink liquids prior to bed. She denies dysuria, hematuria, flank pain.   Immunizations: -Tetanus: Completed in 2017 -Influenza: Did not complete last season, allergy.  -Shingles: Completed Zostavax in 2007 -Pneumonia: Completed Pneumovax in 2020. Due today, declines.   Diet: She endorses a fair diet. Plenty of salads, vegetables. Exercise: She is walking some.  Eye exam: Completed in 2019 Dental exam: No recent exam  Mammogram: Completed in October 2020 Dexa: No recent imaging.  Colonoscopy: Completed in 2020, due in 2025 Hep C Screen:  Negative   BP Readings from Last 3 Encounters:  03/24/20 130/82  10/08/19 136/82  09/30/19 124/62   Wt Readings from Last 3 Encounters:  03/24/20 264 lb 6 oz (119.9 kg)  10/08/19 255 lb 12 oz (116 kg)  09/30/19 220 lb (99.8 kg)     Review of Systems  Constitutional: Negative for unexpected weight change.  HENT: Negative for rhinorrhea.   Respiratory: Negative for cough and shortness of breath.   Cardiovascular: Negative for chest pain.  Gastrointestinal: Negative for constipation and diarrhea.  Genitourinary: Negative for difficulty urinating.       Chronic urinary  frequency with incontinence   Musculoskeletal: Negative for arthralgias.  Skin: Negative for rash.  Allergic/Immunologic: Negative for environmental allergies.  Neurological: Negative for dizziness and headaches.  Psychiatric/Behavioral:       Stress over the last several years, overall able to manage.       Past Medical History:  Diagnosis Date  . Acid reflux   . Anemia   . Arthritis   . Cancer ([place]) 1993   SKIN CANCER  . Complication of anesthesia    HARD TO WAKE UP  . Concussion 01/2017  . Dizziness   . Family history of adverse reaction to anesthesia    PT UNSURE OF FAMILY HISTORY  . Gallstones   . Hematuria    microscopic  . History of hiatal hernia   . History of kidney stones    H/O  . HLD (hyperlipidemia)   . Hoarseness   . Hypertension   . Lower extremity edema    pt states this is chronic and has been told she has lymph edema-wears compression stockings  . Lower leg edema   . Over weight   . PONV (postoperative nausea and vomiting)   . Renal cyst   . Seizures (HCC)    AS A CHILD AND THEN WITH 1 PREGNANCY BUT NONE SINCE  . Sleep apnea    NO CPAP  . Symptomatic cholelithiasis 04/14/2018  . Urge incontinence   . UTI (lower urinary tract infection)   . Vitamin D deficiency      Social History   Socioeconomic History  . Marital  status: Married    Spouse name: Not on file  . Number of children: Not on file  . Years of education: Not on file  . Highest education level: Not on file  Occupational History  . Not on file  Tobacco Use  . Smoking status: Never Smoker  . Smokeless tobacco: Never Used  Substance and Sexual Activity  . Alcohol use: No    Alcohol/week: 0.0 standard drinks  . Drug use: No  . Sexual activity: Not on file  Other Topics Concern  . Not on file  Social History Narrative  . Not on file   Social Determinants of Health   Financial Resource Strain: Low Risk   . Difficulty of Paying Living Expenses: Not hard at all  Food  Insecurity: No Food Insecurity  . Worried About Charity fundraiser in the Last Year: Never true  . Ran Out of Food in the Last Year: Never true  Transportation Needs: No Transportation Needs  . Lack of Transportation (Medical): No  . Lack of Transportation (Non-Medical): No  Physical Activity: Inactive  . Days of Exercise per Week: 0 days  . Minutes of Exercise per Session: 0 min  Stress: Stress Concern Present  . Feeling of Stress : To some extent  Social Connections:   . Frequency of Communication with Friends and Family:   . Frequency of Social Gatherings with Friends and Family:   . Attends Religious Services:   . Active Member of Clubs or Organizations:   . Attends Archivist Meetings:   Marland Kitchen Marital Status:   Intimate Partner Violence: Not At Risk  . Fear of Current or Ex-Partner: No  . Emotionally Abused: No  . Physically Abused: No  . Sexually Abused: No    Past Surgical History:  Procedure Laterality Date  . ABDOMINAL HYSTERECTOMY  1981  . BREAST BIOPSY  1995/2005  . CHOLECYSTECTOMY N/A 05/10/2018   Procedure: LAPAROSCOPIC CHOLECYSTECTOMY;  Surgeon: Vickie Epley, MD;  Location: [facility] OR[facility];  Service: General;  Laterality: N/A;  . COLONOSCOPY WITH PROPOFOL N/A 02/20/2019   Procedure: COLONOSCOPY WITH PROPOFOL;  Surgeon: Lucilla Lame, MD;  Location: [facility] END[facility];  Service: Endoscopy;  Laterality: N/A;  . HERNIA REPAIR  2005  . LAPAROSCOPIC LYSIS OF ADHESIONS N/A 05/10/2018   Procedure: LAPAROSCOPIC LYSIS OF ADHESIONS;  Surgeon: Vickie Epley, MD;  Location: [facility] ORS;  Service: General;  Laterality: N/A;  . TUBAL LIGATION  1975    Family History  Problem Relation Age of Onset  . Cancer Paternal Aunt   . Stroke Paternal Uncle   . Prostate cancer Neg Hx   . Kidney disease Neg Hx   . Bladder Cancer Neg Hx     Allergies  Allergen Reactions  . Morphine Other (See Comments)    Unknown  . Clindamycin/Lincomycin Diarrhea  . Methylprednisolone Swelling    . Codeine Palpitations and Other (See Comments)    unknown  . Morphine And Related Palpitations  . Penicillins Rash and Other (See Comments)    unknown    Current Outpatient Medications on File Prior to Visit  Medication Sig Dispense Refill  . amLODipine (NORVASC) 10 MG tablet TAKE 1 TABLET (10 MG TOTAL) BY MOUTH DAILY. NEED APPOINTMENT FOR ANY MORE REFILLS 30 tablet 0  . cholecalciferol (VITAMIN D) 1000 units tablet Take 1,000 Units by mouth daily.    . folic acid (FOLVITE) Q000111Q MCG tablet Take 800 mcg by mouth daily.    . Multiple Vitamin (MULTIVITAMIN) capsule  Take 1 capsule by mouth daily.    . [DISCONTINUED] omeprazole (PRILOSEC) 40 MG capsule TAKE 1 CAPSULE (40 MG TOTAL) BY MOUTH AT BEDTIME. NEED APPOINTMENT FOR ANY MORE REFILLS 30 capsule 0   No current facility-administered medications on file prior to visit.    BP 130/82   Pulse 90   Temp (!) 96.5 F (35.8 C) (Temporal)   Ht 5\' 4"  (1.626 m)   Wt 264 lb 6 oz (119.9 kg)   SpO2 98%   BMI 45.38 kg/m    Objective:   Physical Exam  Constitutional: She is oriented to person, place, and time. She appears well-nourished.  HENT:  Right Ear: Tympanic membrane and ear canal normal.  Left Ear: Tympanic membrane and ear canal normal.  Mouth/Throat: Oropharynx is clear and moist.  Eyes: Pupils are equal, round, and reactive to light. EOM are normal.  Cardiovascular: Normal rate and regular rhythm.  Respiratory: Effort normal and breath sounds normal.  GI: Soft. Bowel sounds are normal. There is no abdominal tenderness.  Musculoskeletal:        General: Normal range of motion.     Cervical back: Neck supple.  Neurological: She is alert and oriented to person, place, and time. No cranial nerve deficit.  Reflex Scores:      Patellar reflexes are 2+ on the right side and 2+ on the left side. Skin: Skin is warm and dry.  Psychiatric: She has a normal mood and affect.           Assessment & Plan:

## 2020-03-24 NOTE — Assessment & Plan Note (Signed)
Prevnar 13 due, she kindly declines. Other vaccinations UTD. Mammogram UTD. Bone density scan due, will obtain this Fall with mammogram. Colonoscopy UTD, due in 2025. Exam today stable. Labs reviewed.

## 2020-03-24 NOTE — Assessment & Plan Note (Signed)
Returned with A1C 5.8. Discussed to work on weight loss through diet and exercise.   Continue to monitor.

## 2020-03-24 NOTE — Assessment & Plan Note (Addendum)
Chronic and dates back to 2016 from what I can see, always moderate to large amount of microscopic bleeding. Evaluated by Urology in 2016, renal US with renal cysts, recommendation for nephrology referral was made at the time. Patient was actually referred to nephrology but patient never followed through.  Culture pending. Given prior Urology work up, will refer to nephrology.

## 2020-03-24 NOTE — Assessment & Plan Note (Signed)
Managed on omeprazole 40 mg, doing well.  Reduce dose to 20 mg. She will update.

## 2020-03-24 NOTE — Assessment & Plan Note (Signed)
Recent lipid panel unremarkable, continue to monitor.

## 2020-03-24 NOTE — Assessment & Plan Note (Signed)
Colonoscopy UTD, due again in 2025.

## 2020-03-24 NOTE — Assessment & Plan Note (Signed)
Overall stable compared to prior years, continue to monitor.

## 2020-03-24 NOTE — Patient Instructions (Signed)
Start exercising. You should be getting 150 minutes of exercise weekly.  Continue to work on a health diet. Ensure you are consuming 64 ounces of water daily.  We will be in touch once we receive your urine test results.  It was a pleasure to see you today!

## 2020-03-24 NOTE — Assessment & Plan Note (Signed)
US renal reviewed. Referral placed to nephrology.

## 2020-03-24 NOTE — Assessment & Plan Note (Signed)
Stable in the office today, continue Amlodipine 10 mg.

## 2020-03-24 NOTE — Assessment & Plan Note (Deleted)
Chronic and dates back to 2016 from what I can see, always moderate to large amount of microscopic bleeding. No prior work up for this.  Culture pending. Consider Urology evaluation given chronic microscopic hematuria coupled with urinary frequency/urge incontinence.

## 2020-03-24 NOTE — Assessment & Plan Note (Signed)
Overall stable, following with lymphedema clinic and vascular services.

## 2020-03-24 NOTE — Assessment & Plan Note (Addendum)
Chronic for years, mostly at night. Also with some incontinence during the day and night.  Check UA today. UA with trace leuks, 3+ blood. Add culture. Consider medication for incontinence vs Urology evaluation, await results of culture.   History of hysterectomy, suspect pelvic floor weakness.

## 2020-03-25 LAB — URINE CULTURE
MICRO NUMBER:: 10352083
SPECIMEN QUALITY:: ADEQUATE

## 2020-04-02 ENCOUNTER — Encounter: Payer: Self-pay | Admitting: *Deleted

## 2020-04-07 ENCOUNTER — Telehealth: Payer: Self-pay

## 2020-04-07 NOTE — Telephone Encounter (Signed)
Noted. Suspect her side pain is MSK in nature, but do agree that she needs nephrology evaluation for renal cysts and chronic hematuria.

## 2020-04-07 NOTE — Telephone Encounter (Signed)
Pt left v/m wanting to know if she needs referral; pt had [facility] on 03/24/20 and pt continues with problem on lt side where had surgery many yrs ago; pt said she fell in chair(chair collapsed) last wk has made her hurt worse. Pt has had pain for years in lt side, lt back and lt groin area. Pt has pressure now as well. I spoke with Charmaine [facility] about nephrology referral and was advised [facility] is supposed to call pt to schedule appt. Pt has not heard from them. charmaine said referral went to [facility] nephrology on 03/26/20. Charmaine suggested pt call [facility]([phone]) and see about appt scheduling. Pt voiced understanding. Pt will cb to [facility] if no satisfactory appt with nephrologist. ED precautions given and pt voiced understanding.FYI to Gentry Fitz NP.

## 2020-04-08 ENCOUNTER — Other Ambulatory Visit: Payer: Self-pay | Admitting: Primary Care

## 2020-04-08 DIAGNOSIS — K219 Gastro-esophageal reflux disease without esophagitis: Secondary | ICD-10-CM

## 2020-05-14 DIAGNOSIS — R600 Localized edema: Secondary | ICD-10-CM | POA: Diagnosis not present

## 2020-05-14 DIAGNOSIS — R319 Hematuria, unspecified: Secondary | ICD-10-CM | POA: Diagnosis not present

## 2020-05-14 DIAGNOSIS — R808 Other proteinuria: Secondary | ICD-10-CM | POA: Diagnosis not present

## 2020-05-15 ENCOUNTER — Telehealth: Payer: Self-pay | Admitting: Primary Care

## 2020-05-15 DIAGNOSIS — R350 Frequency of micturition: Secondary | ICD-10-CM

## 2020-05-15 NOTE — Telephone Encounter (Signed)
Pt called with update on Nephrology visit.  Pt states that Dr Candiss Norse cleared him to start the incontinence medication as his urine was clear when checked yesterday.   Please advise, thanks.

## 2020-05-16 MED ORDER — OXYBUTYNIN CHLORIDE ER 5 MG PO TB24: 5.0000 mg | ORAL_TABLET | Freq: Every day | ORAL | 1 refills | Status: DC

## 2020-05-16 NOTE — Telephone Encounter (Signed)
Spoken and notified patient of Kate Clark's comments. Patient verbalized understanding.  

## 2020-05-16 NOTE — Telephone Encounter (Signed)
Noted. Please notify patient that I sent in oxybutynin XL 5 mg to her pharmacy for overactive bladder. This is taken once nightly. Have her update in 4 weeks, it may take that long before we know effectiveness.

## 2020-06-13 DIAGNOSIS — M79605 Pain in left leg: Secondary | ICD-10-CM | POA: Diagnosis not present

## 2020-06-13 DIAGNOSIS — I890 Lymphedema, not elsewhere classified: Secondary | ICD-10-CM | POA: Diagnosis not present

## 2020-06-13 DIAGNOSIS — R600 Localized edema: Secondary | ICD-10-CM | POA: Diagnosis not present

## 2020-06-13 DIAGNOSIS — M79604 Pain in right leg: Secondary | ICD-10-CM | POA: Diagnosis not present

## 2020-06-25 ENCOUNTER — Other Ambulatory Visit: Payer: Self-pay | Admitting: Primary Care

## 2020-06-25 DIAGNOSIS — K219 Gastro-esophageal reflux disease without esophagitis: Secondary | ICD-10-CM

## 2020-07-03 ENCOUNTER — Inpatient Hospital Stay
Admission: EM | Admit: 2020-07-03 | Discharge: 2020-07-06 | DRG: 389 | Disposition: A | Discharge status: Home / Self Care | Payer: Medicare HMO | Attending: Internal Medicine | Admitting: Internal Medicine

## 2020-07-03 ENCOUNTER — Inpatient Hospital Stay: Payer: Medicare HMO

## 2020-07-03 ENCOUNTER — Emergency Department: Payer: Medicare HMO

## 2020-07-03 ENCOUNTER — Encounter: Payer: Self-pay | Admitting: Emergency Medicine

## 2020-07-03 ENCOUNTER — Other Ambulatory Visit: Payer: Self-pay

## 2020-07-03 DIAGNOSIS — Z880 Allergy status to penicillin: Secondary | ICD-10-CM | POA: Diagnosis not present

## 2020-07-03 DIAGNOSIS — K5651 Intestinal adhesions [bands], with partial obstruction: Principal | ICD-10-CM | POA: Diagnosis present

## 2020-07-03 DIAGNOSIS — I872 Venous insufficiency (chronic) (peripheral): Secondary | ICD-10-CM | POA: Diagnosis present

## 2020-07-03 DIAGNOSIS — K5790 Diverticulosis of intestine, part unspecified, without perforation or abscess without bleeding: Secondary | ICD-10-CM | POA: Diagnosis not present

## 2020-07-03 DIAGNOSIS — K5939 Other megacolon: Secondary | ICD-10-CM | POA: Diagnosis not present

## 2020-07-03 DIAGNOSIS — R5382 Chronic fatigue, unspecified: Secondary | ICD-10-CM | POA: Diagnosis present

## 2020-07-03 DIAGNOSIS — Z881 Allergy status to other antibiotic agents status: Secondary | ICD-10-CM | POA: Diagnosis not present

## 2020-07-03 DIAGNOSIS — Z888 Allergy status to other drugs, medicaments and biological substances status: Secondary | ICD-10-CM | POA: Diagnosis not present

## 2020-07-03 DIAGNOSIS — Z885 Allergy status to narcotic agent status: Secondary | ICD-10-CM | POA: Diagnosis not present

## 2020-07-03 DIAGNOSIS — E559 Vitamin D deficiency, unspecified: Secondary | ICD-10-CM | POA: Diagnosis present

## 2020-07-03 DIAGNOSIS — G4730 Sleep apnea, unspecified: Secondary | ICD-10-CM | POA: Diagnosis present

## 2020-07-03 DIAGNOSIS — Z85828 Personal history of other malignant neoplasm of skin: Secondary | ICD-10-CM

## 2020-07-03 DIAGNOSIS — Z90710 Acquired absence of both cervix and uterus: Secondary | ICD-10-CM

## 2020-07-03 DIAGNOSIS — Z6841 Body Mass Index (BMI) 40.0 and over, adult: Secondary | ICD-10-CM | POA: Diagnosis not present

## 2020-07-03 DIAGNOSIS — K219 Gastro-esophageal reflux disease without esophagitis: Secondary | ICD-10-CM | POA: Diagnosis not present

## 2020-07-03 DIAGNOSIS — I890 Lymphedema, not elsewhere classified: Secondary | ICD-10-CM | POA: Diagnosis not present

## 2020-07-03 DIAGNOSIS — K56600 Partial intestinal obstruction, unspecified as to cause: Secondary | ICD-10-CM

## 2020-07-03 DIAGNOSIS — Z20822 Contact with and (suspected) exposure to covid-19: Secondary | ICD-10-CM | POA: Diagnosis present

## 2020-07-03 DIAGNOSIS — K56699 Other intestinal obstruction unspecified as to partial versus complete obstruction: Secondary | ICD-10-CM | POA: Diagnosis not present

## 2020-07-03 DIAGNOSIS — Z79899 Other long term (current) drug therapy: Secondary | ICD-10-CM

## 2020-07-03 DIAGNOSIS — E876 Hypokalemia: Secondary | ICD-10-CM | POA: Diagnosis present

## 2020-07-03 DIAGNOSIS — Z4682 Encounter for fitting and adjustment of non-vascular catheter: Secondary | ICD-10-CM | POA: Diagnosis not present

## 2020-07-03 DIAGNOSIS — K6389 Other specified diseases of intestine: Secondary | ICD-10-CM | POA: Diagnosis not present

## 2020-07-03 DIAGNOSIS — Z9049 Acquired absence of other specified parts of digestive tract: Secondary | ICD-10-CM

## 2020-07-03 DIAGNOSIS — E785 Hyperlipidemia, unspecified: Secondary | ICD-10-CM | POA: Diagnosis present

## 2020-07-03 DIAGNOSIS — I10 Essential (primary) hypertension: Secondary | ICD-10-CM | POA: Diagnosis not present

## 2020-07-03 DIAGNOSIS — K56609 Unspecified intestinal obstruction, unspecified as to partial versus complete obstruction: Secondary | ICD-10-CM | POA: Diagnosis not present

## 2020-07-03 LAB — COMPREHENSIVE METABOLIC PANEL
ALT: 18 U/L (ref 0–44)
AST: 20 U/L (ref 15–41)
Albumin: 4.3 g/dL (ref 3.5–5.0)
Alkaline Phosphatase: 73 U/L (ref 38–126)
Anion gap: 6 (ref 5–15)
BUN: 20 mg/dL (ref 8–23)
CO2: 28 mmol/L (ref 22–32)
Calcium: 9.1 mg/dL (ref 8.9–10.3)
Chloride: 108 mmol/L (ref 98–111)
Creatinine, Ser: 1.08 mg/dL — ABNORMAL HIGH (ref 0.44–1.00)
GFR calc Af Amer: >60 mL/min (ref >60)
GFR calc non Af Amer: 52 mL/min — ABNORMAL LOW (ref >60)
Glucose, Bld: 112 mg/dL — ABNORMAL HIGH (ref 70–99)
Potassium: 3.6 mmol/L (ref 3.5–5.1)
Sodium: 142 mmol/L (ref 135–145)
Total Bilirubin: 0.6 mg/dL (ref 0.3–1.2)
Total Protein: 7.8 g/dL (ref 6.5–8.1)

## 2020-07-03 LAB — CBC WITH DIFFERENTIAL/PLATELET
Abs Immature Granulocytes: 0.03 10*3/uL (ref 0.00–0.07)
Basophils Absolute: 0.1 10*3/uL (ref 0.0–0.1)
Basophils Relative: 1 %
Eosinophils Absolute: 0.2 10*3/uL (ref 0.0–0.5)
Eosinophils Relative: 1 %
HCT: 39.3 % (ref 36.0–46.0)
Hemoglobin: 12.4 g/dL (ref 12.0–15.0)
Immature Granulocytes: 0 %
Lymphocytes Relative: 22 %
Lymphs Abs: 2.9 10*3/uL (ref 0.7–4.0)
MCH: 28.1 pg (ref 26.0–34.0)
MCHC: 31.6 g/dL (ref 30.0–36.0)
MCV: 88.9 fL (ref 80.0–100.0)
Monocytes Absolute: 1.1 10*3/uL — ABNORMAL HIGH (ref 0.1–1.0)
Monocytes Relative: 8 %
Neutro Abs: 9.3 10*3/uL — ABNORMAL HIGH (ref 1.7–7.7)
Neutrophils Relative %: 68 %
Platelets: 268 10*3/uL (ref 150–400)
RBC: 4.42 MIL/uL (ref 3.87–5.11)
RDW: 14.2 % (ref 11.5–15.5)
WBC: 13.5 10*3/uL — ABNORMAL HIGH (ref 4.0–10.5)
nRBC: 0 % (ref 0.0–0.2)

## 2020-07-03 LAB — URINALYSIS, COMPLETE (UACMP) WITH MICROSCOPIC
Bacteria, UA: NONE SEEN
Bilirubin Urine: NEGATIVE
Glucose, UA: NEGATIVE mg/dL
Ketones, ur: NEGATIVE mg/dL
Nitrite: NEGATIVE
Protein, ur: NEGATIVE mg/dL
Specific Gravity, Urine: 1.014 (ref 1.005–1.030)
pH: 6 (ref 5.0–8.0)

## 2020-07-03 LAB — LIPASE, BLOOD: Lipase: 23 U/L (ref 11–51)

## 2020-07-03 LAB — SARS CORONAVIRUS 2 BY RT PCR (HOSPITAL ORDER, PERFORMED IN ~~LOC~~ HOSPITAL LAB): SARS Coronavirus 2: NEGATIVE

## 2020-07-03 MED ORDER — BENZOCAINE 20 % MT AERO
INHALATION_SPRAY | Freq: Once | OROMUCOSAL | Status: AC
  Administered 2020-07-03: 1 via OROMUCOSAL
  Filled 2020-07-03: qty 57

## 2020-07-03 MED ORDER — SODIUM CHLORIDE 0.9 % IV BOLUS
500.0000 mL | Freq: Once | INTRAVENOUS | Status: AC
  Administered 2020-07-03: 500 mL via INTRAVENOUS

## 2020-07-03 MED ORDER — IOHEXOL 300 MG/ML  SOLN
125.0000 mL | Freq: Once | INTRAMUSCULAR | Status: AC | PRN
  Administered 2020-07-03: 125 mL via INTRAVENOUS

## 2020-07-03 MED ORDER — ONDANSETRON HCL 4 MG/2ML IJ SOLN
4.0000 mg | Freq: Once | INTRAMUSCULAR | Status: AC
  Administered 2020-07-03: 4 mg via INTRAVENOUS
  Filled 2020-07-03: qty 2

## 2020-07-03 MED ORDER — HYDROMORPHONE HCL 1 MG/ML IJ SOLN
1.0000 mg | Freq: Once | INTRAMUSCULAR | Status: AC
  Administered 2020-07-03: 1 mg via INTRAVENOUS
  Filled 2020-07-03: qty 1

## 2020-07-03 MED ORDER — ENOXAPARIN SODIUM 40 MG/0.4ML ~~LOC~~ SOLN
40.0000 mg | SUBCUTANEOUS | Status: DC
  Administered 2020-07-03 – 2020-07-05 (×3): 40 mg via SUBCUTANEOUS
  Filled 2020-07-03 (×3): qty 0.4

## 2020-07-03 MED ORDER — ONDANSETRON HCL 4 MG/2ML IJ SOLN
4.0000 mg | Freq: Four times a day (QID) | INTRAMUSCULAR | Status: DC | PRN
  Administered 2020-07-03 – 2020-07-04 (×3): 4 mg via INTRAVENOUS
  Filled 2020-07-03 (×3): qty 2

## 2020-07-03 MED ORDER — SODIUM CHLORIDE 0.9 % IV SOLN: INTRAVENOUS | Status: DC

## 2020-07-03 MED ORDER — PANTOPRAZOLE SODIUM 40 MG IV SOLR
40.0000 mg | INTRAVENOUS | Status: DC
  Administered 2020-07-03 – 2020-07-05 (×3): 40 mg via INTRAVENOUS
  Filled 2020-07-03 (×3): qty 40

## 2020-07-03 MED ORDER — SODIUM CHLORIDE 0.9% FLUSH
3.0000 mL | Freq: Two times a day (BID) | INTRAVENOUS | Status: DC
  Administered 2020-07-03 – 2020-07-05 (×5): 3 mL via INTRAVENOUS

## 2020-07-03 MED ORDER — ONDANSETRON HCL 4 MG PO TABS: 4.0000 mg | ORAL_TABLET | Freq: Four times a day (QID) | ORAL | Status: DC | PRN

## 2020-07-03 MED ORDER — HYDRALAZINE HCL 20 MG/ML IJ SOLN: 10.0000 mg | Freq: Four times a day (QID) | INTRAMUSCULAR | Status: DC | PRN

## 2020-07-03 NOTE — ED Notes (Signed)
Pt assisted to toilet at this time. Pt reports feeling dizzy but insisted on walking from bed to toilet in room. Pt able to maintain steady gait while up.

## 2020-07-03 NOTE — ED Notes (Signed)
Patient assisted to bathroom x1. Reports nausea upon returning to bed. Zofran 4mg  administered per orders.

## 2020-07-03 NOTE — ED Notes (Signed)
Pt in with co left sided abd pain started last night after eating. Pt has vomited 4-5 times since. Denies any diarrhea or fever. Pt states has urinary frequency for few days. Pt states has same symptoms a few years ago and was dx with UTI.
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
## 2020-07-03 NOTE — ED Triage Notes (Signed)
Patient ambulatory to triage with steady gait, without difficulty or distress noted; pt reports left lower abd pain accomp by N/V since last night

## 2020-07-03 NOTE — Plan of Care (Signed)
Patient alert and oriented admitted for a partial bowel obstruction. NPO with NG tube on LIS. Independent to [facility].

## 2020-07-03 NOTE — Consult Note (Signed)
[facility] SURGICAL CONSULTATION NOTE (initial) - cpt: 73710   HISTORY OF PRESENT ILLNESS (HPI):  69 y.o. female presented to [facility] ED today for evaluation of abdominal pain. Patient reports a few day history of mild lower abdominal pain. However, this pain acutely worsened over night and has primarily localized to her left lower quadrant. The pain is now more severe in nature and radiates through to her back . Nothing seems to make the pain better. She endorses nausea and multiple episodes of non-bloody non-bilious emesis with the pain. No fever, chills, cough, congestion, SOB, CP, or urinary changes. No BM in last 48 hours. She reports that she has a history of similar presentation in the past but this was attributed to kidney stones. No history of obstructions. Previous abdominal surgeries are positive for abdominal hysterectomy, hernia repair, and laparoscopic cholecystectomy with lysis of adhesions with Dr Rosana Hoes in 04/2018. Work up in the ED revealed a mild leukocytosis to 13K, slight AKi with sCr - 1.08, and CT Abdomen/Pelvis was concerning for possible small bowel obstruction.   Surgery is consulted by emergency medicine physician Dr. Arta Silence, MD in this context for evaluation and management of small bowel obstructions.   PAST MEDICAL HISTORY (PMH):  Past Medical History:  Diagnosis Date  . Acid reflux   . Acute pain of right foot 10/08/2019  . Anemia   . Arthritis   . Cancer ([facility]) 1993   SKIN CANCER  . Complication of anesthesia    HARD TO WAKE UP  . Concussion 01/2017  . Dizziness   . Family history of adverse reaction to anesthesia    PT UNSURE OF FAMILY HISTORY  . Gallstones   . Hematuria    microscopic  . History of hiatal hernia   . History of kidney stones    H/O  . HLD (hyperlipidemia)   . Hoarseness   . Hypertension   . Lower extremity edema    pt states this is chronic and has been told she has lymph edema-wears compression stockings  .  Lower leg edema   . Over weight   . PONV (postoperative nausea and vomiting)   . Renal cyst   . Seizures (HCC)    AS A CHILD AND THEN WITH 1 PREGNANCY BUT NONE SINCE  . Sleep apnea    NO CPAP  . Symptomatic cholelithiasis 04/14/2018  . Urge incontinence   . UTI (lower urinary tract infection)   . Vitamin D deficiency      PAST SURGICAL HISTORY ([place]):  Past Surgical History:  Procedure Laterality Date  . ABDOMINAL HYSTERECTOMY  1981  . BREAST BIOPSY  1995/2005  . CHOLECYSTECTOMY N/A 05/10/2018   Procedure: LAPAROSCOPIC CHOLECYSTECTOMY;  Surgeon: Vickie Epley, MD;  Location: [facility] ORS;  Service: General;  Laterality: N/A;  . COLONOSCOPY WITH PROPOFOL N/A 02/20/2019   Procedure: COLONOSCOPY WITH PROPOFOL;  Surgeon: Lucilla Lame, MD;  Location: [facility] ENDOSCOPY;  Service: Endoscopy;  Laterality: N/A;  . HERNIA REPAIR  2005  . LAPAROSCOPIC LYSIS OF ADHESIONS N/A 05/10/2018   Procedure: LAPAROSCOPIC LYSIS OF ADHESIONS;  Surgeon: Vickie Epley, MD;  Location: [facility] ORS;  Service: General;  Laterality: N/A;  . TUBAL LIGATION  1975     MEDICATIONS:  Prior to Admission medications   Medication Sig Start Date End Date Taking? Authorizing Provider  amLODipine (NORVASC) 10 MG tablet Take 1 tablet (10 mg total) by mouth daily. For blood pressure. 03/24/20   Pleas Koch, NP  cholecalciferol (VITAMIN  D) 1000 units tablet Take 1,000 Units by mouth daily.    [provider]  folic acid (FOLVITE) 902 MCG tablet Take 800 mcg by mouth daily.    [provider]  Multiple Vitamin (MULTIVITAMIN) capsule Take 1 capsule by mouth daily.    [provider]  omeprazole (PRILOSEC) 20 MG capsule TAKE 1 CAPSULE (20 MG TOTAL) BY MOUTH DAILY. FOR HEARTBURN. 06/25/20   Pleas Koch, NP  oxybutynin (DITROPAN-XL) 5 MG 24 hr tablet Take 1 tablet (5 mg total) by mouth at bedtime. For overactive bladder. 05/16/20   Pleas Koch, NP     ALLERGIES:  Allergies  Allergen  Reactions  . Morphine Other (See Comments)    Unknown  . Clindamycin/Lincomycin Diarrhea  . Methylprednisolone Swelling  . Codeine Palpitations and Other (See Comments)    unknown  . Morphine And Related Palpitations  . Penicillins Rash and Other (See Comments)    unknown     SOCIAL HISTORY:  Social History   Socioeconomic History  . Marital status: Married    Spouse name: Not on file  . Number of children: Not on file  . Years of education: Not on file  . Highest education level: Not on file  Occupational History  . Not on file  Tobacco Use  . Smoking status: Never Smoker  . Smokeless tobacco: Never Used  Vaping Use  . Vaping Use: Never used  Substance and Sexual Activity  . Alcohol use: No    Alcohol/week: 0.0 standard drinks  . Drug use: No  . Sexual activity: Not on file  Other Topics Concern  . Not on file  Social History Narrative  . Not on file   Social Determinants of Health   Financial Resource Strain: Low Risk   . Difficulty of Paying Living Expenses: Not hard at all  Food Insecurity: No Food Insecurity  . Worried About Charity fundraiser in the Last Year: Never true  . Ran Out of Food in the Last Year: Never true  Transportation Needs: No Transportation Needs  . Lack of Transportation (Medical): No  . Lack of Transportation (Non-Medical): No  Physical Activity: Inactive  . Days of Exercise per Week: 0 days  . Minutes of Exercise per Session: 0 min  Stress: Stress Concern Present  . Feeling of Stress : To some extent  Social Connections:   . Frequency of Communication with Friends and Family:   . Frequency of Social Gatherings with Friends and Family:   . Attends Religious Services:   . Active Member of Clubs or Organizations:   . Attends Archivist Meetings:   Marland Kitchen Marital Status:   Intimate Partner Violence: Not At Risk  . Fear of Current or Ex-Partner: No  . Emotionally Abused: No  . Physically Abused: No  . Sexually Abused: No      FAMILY HISTORY:  Family History  Problem Relation Age of Onset  . Cancer Paternal Aunt   . Stroke Paternal Uncle   . Prostate cancer Neg Hx   . Kidney disease Neg Hx   . Bladder Cancer Neg Hx       REVIEW OF SYSTEMS:  Review of Systems  Constitutional: Negative for chills and fever.  HENT: Negative for congestion and sore throat.   Respiratory: Negative for cough and shortness of breath.   Cardiovascular: Negative for chest pain and palpitations.  Gastrointestinal: Positive for abdominal pain, nausea and vomiting. Negative for constipation and diarrhea.  Genitourinary: Negative  for dysuria and urgency.  All other systems reviewed and are negative.   VITAL SIGNS:  Temp:  [98.6 F (37 C)] 98.6 F (37 C) (07/22 2641) Pulse Rate:  [94] 94 (07/22 0637) Resp:  [20] 20 (07/22 0637) BP: (134)/(83) 134/83 (07/22 0637) SpO2:  [100 %] 100 % (07/22 0637) Weight:  [108.9 kg] 108.9 kg (07/22 0635)     Height: 5\' 4"  (162.6 cm) Weight: 108.9 kg BMI (Calculated): 41.18   INTAKE/OUTPUT:  No intake/output data recorded.  PHYSICAL EXAM:  Physical Exam Vitals and nursing note reviewed. Exam conducted with a chaperone present.  Constitutional:      General: She is not in acute distress.    Appearance: She is well-developed. She is obese. She is not ill-appearing.  HENT:     Head: Normocephalic and atraumatic.     Mouth/Throat:     Comments: NGT in place Eyes:     General: No scleral icterus.    Extraocular Movements: Extraocular movements intact.  Cardiovascular:     Rate and Rhythm: Normal rate and regular rhythm.     Heart sounds: Normal heart sounds. No murmur heard.   Pulmonary:     Effort: Pulmonary effort is normal. No respiratory distress.  Abdominal:     General: A surgical scar is present. There is no distension.     Palpations: Abdomen is soft.     Tenderness: There is no abdominal tenderness. There is no guarding or rebound.     Comments: Abdomen is obese, soft, no  appreciable tenderness, non-distended, no rebound/guarding, surgical scars present  Genitourinary:    Comments: Deferred Skin:    General: Skin is warm and dry.     Coloration: Skin is not jaundiced or pale.  Neurological:     General: No focal deficit present.     Mental Status: She is alert and oriented to person, place, and time.  Psychiatric:        Mood and Affect: Mood normal.        Behavior: Behavior normal.      Labs:  CBC Latest Ref Rng & Units 07/03/2020 03/19/2020 01/09/2019  WBC 4.0 - 10.5 K/uL 13.5(H) 8.6 6.7  Hemoglobin 12.0 - 15.0 g/dL 12.4 11.4(L) 12.2  Hematocrit 36 - 46 % 39.3 35.2(L) 37.9  Platelets 150 - 400 K/uL 268 241.0 248.0   CMP Latest Ref Rng & Units 07/03/2020 03/19/2020 01/09/2019  Glucose 70 - 99 mg/dL 112(H) 97 96  BUN 8 - 23 mg/dL 20 20 16   Creatinine 0.44 - 1.00 mg/dL 1.08(H) 0.92 0.86  Sodium 135 - 145 mmol/L 142 143 142  Potassium 3.5 - 5.1 mmol/L 3.6 3.8 3.7  Chloride 98 - 111 mmol/L 108 108 109  CO2 22 - 32 mmol/L 28 29 28   Calcium 8.9 - 10.3 mg/dL 9.1 8.9 9.2  Total Protein 6.5 - 8.1 g/dL 7.8 6.4 7.1  Total Bilirubin 0.3 - 1.2 mg/dL 0.6 0.5 0.6  Alkaline Phos 38 - 126 U/L 73 73 84  AST 15 - 41 U/L 20 15 15   ALT 0 - 44 U/L 18 17 17      Imaging studies:   CT Abdomen/Pelvis (07/03/2020) personally reviewed showing mildly dilated loops of small bowel, no clear transition zone, no free air, and radiobiologist report reviewed:  IMPRESSION: Mildly dilated loops of mid to distal jejunum with air-fluid levels and mild fecalization of bowel contents. Changes are suspicious for a partial closed loop obstruction/mild ileus and may be related to underlying adhesions  as no definitive transition zone is seen.  Diverticulosis without diverticulitis.  Chronic changes as described above.   Assessment/Plan: (ICD-10's: K20.609) 69 y.o. female with abdominal pain, nausea, and emesis concerning for likely partial SBO attributable to post-surgical  adhesive disease.   - Appreciate medicine admission  - NPO + IVF resuscitation   - Agree with NGT decompression; LIS; monitor ad record output   - Monitor abdominal examination +/- serial KUBs; on-going bowel function    - Pain control prn (minimize narcotics); antiemetics prn  - Mobilization encouraged  - No surgical intervention; continue with conservative measures   - Further management per primary service; we will follow  All of the above findings and recommendations were discussed with the patient, and all of patient's questions were answered to her expressed satisfaction.  Thank you for the opportunity to participate in this patient's care.   -- Edison Simon, PA-C [facility] 07/03/2020, 9:57 AM [phone] M-F: 7am - 4pm

## 2020-07-03 NOTE — ED Notes (Signed)
Patient observed resting quietly in bed. On assessment she reports feeling "a lot better" post NG tube placement. Call bell in reach. No acute distress noted.

## 2020-07-04 DIAGNOSIS — I890 Lymphedema, not elsewhere classified: Secondary | ICD-10-CM

## 2020-07-04 DIAGNOSIS — E876 Hypokalemia: Secondary | ICD-10-CM

## 2020-07-04 LAB — BASIC METABOLIC PANEL
Anion gap: 9 (ref 5–15)
BUN: 13 mg/dL (ref 8–23)
CO2: 27 mmol/L (ref 22–32)
Calcium: 8.4 mg/dL — ABNORMAL LOW (ref 8.9–10.3)
Chloride: 109 mmol/L (ref 98–111)
Creatinine, Ser: 0.95 mg/dL (ref 0.44–1.00)
GFR calc Af Amer: >60 mL/min (ref >60)
GFR calc non Af Amer: >60 mL/min (ref >60)
Glucose, Bld: 90 mg/dL (ref 70–99)
Potassium: 3.3 mmol/L — ABNORMAL LOW (ref 3.5–5.1)
Sodium: 145 mmol/L (ref 135–145)

## 2020-07-04 LAB — CBC
HCT: 34.3 % — ABNORMAL LOW (ref 36.0–46.0)
Hemoglobin: 10.6 g/dL — ABNORMAL LOW (ref 12.0–15.0)
MCH: 27.7 pg (ref 26.0–34.0)
MCHC: 30.9 g/dL (ref 30.0–36.0)
MCV: 89.8 fL (ref 80.0–100.0)
Platelets: 221 10*3/uL (ref 150–400)
RBC: 3.82 MIL/uL — ABNORMAL LOW (ref 3.87–5.11)
RDW: 14.3 % (ref 11.5–15.5)
WBC: 8.7 10*3/uL (ref 4.0–10.5)
nRBC: 0 % (ref 0.0–0.2)

## 2020-07-04 LAB — HIV ANTIBODY (ROUTINE TESTING W REFLEX): HIV Screen 4th Generation wRfx: NONREACTIVE

## 2020-07-04 MED ORDER — POTASSIUM CHLORIDE IN NACL 20-0.9 MEQ/L-% IV SOLN
INTRAVENOUS | Status: DC
  Filled 2020-07-04 (×5): qty 1000

## 2020-07-04 NOTE — Progress Notes (Signed)
[facility] SURGICAL PROGRESS NOTE (cpt [phone])  Hospital Day(s): 1.   Interval History: Patient seen and examined, no acute events or new complaints overnight. Patient reports she is feeling better this morning most of her complaints involve the NGT, denies abdominal pain, nausea, emesis, fever, chills. Labs this morning are reassuring aside from mild hypokalemia to 3.3. NGT output in the last 24 hours recorded at 200 css. She has reported multiple episodes of flatus this morning.    Review of Systems:  Constitutional: denies fever, chills  HEENT: denies cough or congestion, + sore throat  Respiratory: denies any shortness of breath  Cardiovascular: denies chest pain or palpitations  Gastrointestinal: denies abdominal pain, N/V, or diarrhea/and bowel function as per interval history Genitourinary: denies burning with urination or urinary frequency Musculoskeletal: denies pain, decreased motor or sensation  Vital signs in last 24 hours: [min-max] current  Temp:  [97.6 F (36.4 C)-98.2 F (36.8 C)] 98 F (36.7 C) (07/23 0427) Pulse Rate:  [56-86] 62 (07/23 0427) Resp:  [14-20] 18 (07/23 0427) BP: (121-151)/(58-82) 133/78 (07/23 0427) SpO2:  [92 %-100 %] 98 % (07/23 0427)     Height: 5\' 4"  (162.6 cm) Weight: 108.9 kg BMI (Calculated): 41.18   Intake/Output last 2 shifts:  07/22 0701 - 07/23 0700 In: 2103.8 [I.V.:1602.4; IV Piggyback:501.5] Out: 950 [Urine:750; Emesis/NG output:200]   Physical Exam:  Constitutional: alert, cooperative and no distress  HENT: normocephalic without obvious abnormality, NGT in place   Eyes: PERRL, EOM's grossly intact and symmetric  Respiratory: breathing non-labored at rest  Cardiovascular: regular rate and sinus rhythm  Gastrointestinal: Obese, soft, non-tender, and non-distended, no rebound/guarding Musculoskeletal: no edema or wounds, motor and sensation grossly intact, NT    Labs:  CBC Latest Ref Rng & Units 07/04/2020 07/03/2020  03/19/2020  WBC 4.0 - 10.5 K/uL 8.7 13.5(H) 8.6  Hemoglobin 12.0 - 15.0 g/dL 10.6(L) 12.4 11.4(L)  Hematocrit 36 - 46 % 34.3(L) 39.3 35.2(L)  Platelets 150 - 400 K/uL 221 268 241.0   CMP Latest Ref Rng & Units 07/04/2020 07/03/2020 03/19/2020  Glucose 70 - 99 mg/dL 90 112(H) 97  BUN 8 - 23 mg/dL 13 20 20   Creatinine 0.44 - 1.00 mg/dL 0.95 1.08(H) 0.92  Sodium 135 - 145 mmol/L 145 142 143  Potassium 3.5 - 5.1 mmol/L 3.3(L) 3.6 3.8  Chloride 98 - 111 mmol/L 109 108 108  CO2 22 - 32 mmol/L 27 28 29   Calcium 8.9 - 10.3 mg/dL 8.4(L) 9.1 8.9  Total Protein 6.5 - 8.1 g/dL - 7.8 6.4  Total Bilirubin 0.3 - 1.2 mg/dL - 0.6 0.5  Alkaline Phos 38 - 126 U/L - 73 73  AST 15 - 41 U/L - 20 15  ALT 0 - 44 U/L - 18 17     Imaging studies: No new pertinent imaging studies   Assessment/Plan: (ICD-10's: K65.609) 69 y.o. female with clinically improving/resolving partial SBO attributable to post-surgical adhesive disease.   - Given limited NGT output and return of bowel function, recommend preforming clamping trial of NGT prior to removal. I clamped NGT at 0900, recheck residuals at 1300, if <150 ccs we can consider removal  - Continue NPO + IVF resuscitation in the meantime  - Replete K+; monitor              - Monitor abdominal examination +/- serial KUBs; on-going bowel function               - Pain control prn (minimize narcotics);  antiemetics prn             - Mobilization encouraged             - No surgical intervention; continue with conservative measures              - Further management per primary service; we will follow  All of the above findings and recommendations were discussed with the patient, and all of patient's questions were answered to her expressed satisfaction.  -- Edison Simon, PA-C [facility] 07/04/2020, 8:26 AM [phone] M-F: 7am - 4pm

## 2020-07-04 NOTE — Progress Notes (Signed)
Patient ID: Ann Barker, female   DOB: May 17, 1951, 69 y.o.   MRN: [id number] [facility] PROGRESS NOTE  Ann Barker HDQ:[id number] DOB: July 02, 1951 DOA: 07/03/2020 PCP: Pleas Koch, NP  HPI/Subjective: Patient feeling better today.  Came in with nausea vomiting.  No abdominal pain currently.  Started passing some gas.  Patient still had an NG tube this morning when I saw her.  Objective: Vitals:   07/04/20 0427 07/04/20 1133  BP: (!) 133/78 (!) 131/80  Pulse: 62 67  Resp: 18 16  Temp: 98 F (36.7 C) 98.4 F (36.9 C)  SpO2: 98% 99%    Intake/Output Summary (Last 24 hours) at 07/04/2020 1413 Last data filed at 07/04/2020 0531 Gross per 24 hour  Intake 1602.36 ml  Output 950 ml  Net 652.36 ml   Filed Weights   07/03/20 0635  Weight: 108.9 kg    ROS: Review of Systems  Respiratory: Negative for shortness of breath.   Cardiovascular: Negative for chest pain.  Gastrointestinal: Negative for abdominal pain, nausea and vomiting.   Exam: Physical Exam HENT:     Head: Normocephalic.     Nose: No mucosal edema.     Mouth/Throat:     Pharynx: No oropharyngeal exudate.  Eyes:     General: Lids are normal.     Conjunctiva/sclera: Conjunctivae normal.     Pupils: Pupils are equal, round, and reactive to light.  Cardiovascular:     Rate and Rhythm: Normal rate and regular rhythm.     Heart sounds: Normal heart sounds, S1 normal and S2 normal.  Pulmonary:     Breath sounds: No decreased breath sounds, wheezing, rhonchi or rales.  Abdominal:     General: Bowel sounds are decreased.     Palpations: Abdomen is soft.     Tenderness: There is no abdominal tenderness.  Musculoskeletal:     Right lower leg: Swelling present.     Left lower leg: Swelling present.  Skin:    General: Skin is warm.     Findings: No rash.  Neurological:     Mental Status: She is alert and oriented to person, place, and time.       Data Reviewed: Basic Metabolic Panel: Recent  Labs  Lab 07/03/20 0650 07/04/20 0458  NA 142 145  K 3.6 3.3*  CL 108 109  CO2 28 27  GLUCOSE 112* 90  BUN 20 13  CREATININE 1.08* 0.95  CALCIUM 9.1 8.4*   Liver Function Tests: Recent Labs  Lab 07/03/20 0650  AST 20  ALT 18  ALKPHOS 73  BILITOT 0.6  PROT 7.8  ALBUMIN 4.3   Recent Labs  Lab 07/03/20 0650  LIPASE 23   CBC: Recent Labs  Lab 07/03/20 0650 07/04/20 0458  WBC 13.5* 8.7  NEUTROABS 9.3*  --   HGB 12.4 10.6*  HCT 39.3 34.3*  MCV 88.9 89.8  PLT 268 221     Recent Results (from the past 240 hour(s))  SARS Coronavirus 2 by RT PCR (hospital order, performed in [facility] hospital lab) Nasopharyngeal Nasopharyngeal Swab     Status: None   Collection Time: 07/03/20 10:31 AM   Specimen: Nasopharyngeal Swab  Result Value Ref Range Status   SARS Coronavirus 2 NEGATIVE NEGATIVE Final    Comment: (NOTE) SARS-CoV-2 target nucleic acids are NOT DETECTED.  The SARS-CoV-2 RNA is generally detectable in upper and lower respiratory specimens during the acute phase of infection. The lowest concentration of SARS-CoV-2 viral copies this  assay can detect is 250 copies / mL. A negative result does not preclude SARS-CoV-2 infection and should not be used as the sole basis for treatment or other patient management decisions.  A negative result may occur with improper specimen collection / handling, submission of specimen other than nasopharyngeal swab, presence of viral mutation(s) within the areas targeted by this assay, and inadequate number of viral copies (<250 copies / mL). A negative result must be combined with clinical observations, patient history, and epidemiological information.  Fact Sheet for Patients:   StrictlyIdeas.no  Fact Sheet for Healthcare Providers: BankingDealers.co.za  This test is not yet approved or  cleared by the [place] FDA and has been authorized for detection and/or diagnosis of  SARS-CoV-2 by FDA under an Emergency Use Authorization (EUA).  This EUA will remain in effect (meaning this test can be used) for the duration of the COVID-19 declaration under Section 564(b)(1) of the Act, 21 U.S.C. section 360bbb-3(b)(1), unless the authorization is terminated or revoked sooner.  Performed at [facility], [address] [place], [place] [address]      Studies: DG Abdomen 1 View  Result Date: 07/03/2020 CLINICAL DATA:  NG tube placement EXAM: ABDOMEN - 1 VIEW COMPARISON:  07/03/2020 CT FINDINGS: NG tube courses below the diaphragm with distal tip and side hole terminating within the expected location of the gastric body. Mildly distended loop of small bowel within the central abdomen, as seen on previous CT. IMPRESSION: NG tube projects within the expected location of the gastric body. Electronically Signed   By: Davina Poke D.O.   On: 07/03/2020 11:31   CT ABDOMEN PELVIS W CONTRAST  Result Date: 07/03/2020 CLINICAL DATA:  Left lower quadrant pain and vomiting EXAM: CT ABDOMEN AND PELVIS WITH CONTRAST TECHNIQUE: Multidetector CT imaging of the abdomen and pelvis was performed using the standard protocol following bolus administration of intravenous contrast. CONTRAST:  128mL OMNIPAQUE IOHEXOL 300 MG/ML  SOLN COMPARISON:  04/10/2018 FINDINGS: Lower chest: Lung bases are clear. Hepatobiliary: Liver shows a lateral right lobe cysts stable in appearance from the prior exam. The gallbladder has been surgically removed. No significant biliary dilatation is noted. Pancreas: Unremarkable. No pancreatic ductal dilatation or surrounding inflammatory changes. Spleen: Normal in size without focal abnormality. Adrenals/Urinary Tract: Adrenal glands are within normal limits. Scattered cysts are noted within the kidneys bilaterally. Normal enhancement is otherwise noted. Normal excretion of contrast is seen. No renal calculi are seen. Bladder is within normal limits.  Stomach/Bowel: Scattered diverticular change of the colon is noted. No evidence of diverticulitis is seen. Appendix appears within normal limits. Stomach is within normal limits. A few loops of mid to distal jejunum are noted to be dilated and fluid-filled with some fecalization of bowel contents suspicious for mild small bowel ileus or possible closed loop obstruction. No definitive transition zones are noted. These changes could be related to prior adhesions. Vascular/Lymphatic: Aortic atherosclerosis. No enlarged abdominal or pelvic lymph nodes. Reproductive: Status post hysterectomy. No adnexal masses. Other: No abdominal wall hernia or abnormality. No abdominopelvic ascites. Musculoskeletal: Degenerative changes of lumbar spine are noted. IMPRESSION: Mildly dilated loops of mid to distal jejunum with air-fluid levels and mild fecalization of bowel contents. Changes are suspicious for a partial closed loop obstruction/mild ileus and may be related to underlying adhesions as no definitive transition zone is seen. Diverticulosis without diverticulitis. Chronic changes as described above. Electronically Signed   By: Inez Catalina M.D.   On: 07/03/2020 08:36  Scheduled Meds: . enoxaparin (LOVENOX) injection  40 mg Subcutaneous Q24H  . pantoprazole (PROTONIX) IV  40 mg Intravenous Q24H  . sodium chloride flush  3 mL Intravenous Q12H   Continuous Infusions: . 0.9 % NaCl with KCl 20 mEq / L 75 mL/hr at 07/04/20 0908    Assessment/Plan:  1. Partial small bowel obstruction.  Case discussed with general surgery and they will do a trial of NG tube clamping.  Continue IV fluid hydration.  As needed nausea medications. 2. Hypokalemia since being n.p.o. we will add potassium to the IV fluids. 3. Essential hypertension.  While n.p.o. Norvasc is on hold but will have IV as needed hydralazine if needed 4. GERD on IV Protonix 5. Chronic lymphedema.  Patient states that she is being set up with a lymphedema  device. 6. Morbid obesity with a BMI of 41.20    Code Status:     Code Status Orders  (From admission, onward)         Start     Ordered   07/03/20 1039  Full code  Continuous        07/03/20 1049        Code Status History    This patient has a current code status but no historical code status.   Advance Care Planning Activity     Family Communication: Patient deferred me calling family at this time Disposition Plan: Status is: Inpatient  Dispo: The patient is from: Home              Anticipated d/c is to: Home              Anticipated d/c date is: Partial small bowel obstructions can potentially take a few days to improve.  Patient had an NG tube this morning.              Patient currently being treated for partial small bowel obstruction.  Prior to disposition will have to tolerate diet and have bowel movements.  Consultants:  General surgery  Time spent: 28 minutes  [place]

## 2020-07-05 MED ORDER — POTASSIUM CHLORIDE CRYS ER 20 MEQ PO TBCR
40.0000 meq | EXTENDED_RELEASE_TABLET | Freq: Once | ORAL | Status: AC
  Administered 2020-07-05: 40 meq via ORAL
  Filled 2020-07-05: qty 2

## 2020-07-05 NOTE — Progress Notes (Signed)
[facility] Day(s): 2.   Post op day(s):  Marland Kitchen   Interval History: Patient seen and examined, no acute events or new complaints overnight. Patient reports feeling good today.  She reports he continue passing gas and having bowel movement.  She reported that she was able to tolerate the clear liquids without any problem.  She denies any abdominal pain.  There is no palliation.  There is no alleviating or aggravating factor.  Vital signs in last 24 hours: [min-max] current  Temp:  [98.3 F (36.8 C)-98.4 F (36.9 C)] 98.3 F (36.8 C) (07/24 0435) Pulse Rate:  [64-69] 69 (07/24 0435) Resp:  [16] 16 (07/24 0435) BP: (131-143)/(70-80) 138/74 (07/24 0435) SpO2:  [98 %-99 %] 98 % (07/24 0435)     Height: 5\' 4"  (162.6 cm) Weight: 108.9 kg BMI (Calculated): 41.18   Physical Exam:  Constitutional: alert, cooperative and no distress  Respiratory: breathing non-labored at rest  Cardiovascular: regular rate and sinus rhythm  Gastrointestinal: soft, non-tender, and non-distended  Labs:  CBC Latest Ref Rng & Units 07/04/2020 07/03/2020 03/19/2020  WBC 4.0 - 10.5 K/uL 8.7 13.5(H) 8.6  Hemoglobin 12.0 - 15.0 g/dL 10.6(L) 12.4 11.4(L)  Hematocrit 36 - 46 % 34.3(L) 39.3 35.2(L)  Platelets 150 - 400 K/uL 221 268 241.0   CMP Latest Ref Rng & Units 07/04/2020 07/03/2020 03/19/2020  Glucose 70 - 99 mg/dL 90 112(H) 97  BUN 8 - 23 mg/dL 13 20 20   Creatinine 0.44 - 1.00 mg/dL 0.95 1.08(H) 0.92  Sodium 135 - 145 mmol/L 145 142 143  Potassium 3.5 - 5.1 mmol/L 3.3(L) 3.6 3.8  Chloride 98 - 111 mmol/L 109 108 108  CO2 22 - 32 mmol/L 27 28 29   Calcium 8.9 - 10.3 mg/dL 8.4(L) 9.1 8.9  Total Protein 6.5 - 8.1 g/dL - 7.8 6.4  Total Bilirubin 0.3 - 1.2 mg/dL - 0.6 0.5  Alkaline Phos 38 - 126 U/L - 73 73  AST 15 - 41 U/L - 20 15  ALT 0 - 44 U/L - 18 17    Imaging studies: No new pertinent imaging studies   Assessment/Plan:  69 y.o. female with small bowel obstruction, complicated by  pertinent comorbidities including GERD, sleep apnea, obesity.  Patient with resolving small bowel obstruction.  She tolerated clear liquid without abdominal pain.  She continue passing gas and having bowel movement.  I will advance her diet to full liquids.  No contraindication to advance as tolerated.  We will continue to follow to assist with management of this patient.  Encourage the patient to ambulate.  Arnold Long, MD

## 2020-07-05 NOTE — Progress Notes (Signed)
Patient ID: Ann Barker, female   DOB: [birth year], 69 y.o.   MRN: [id number] [facility] PROGRESS NOTE  Ann Barker GDJ:[id number] DOB: [birth year] DOA: 07/03/2020 PCP: Pleas Koch, NP  HPI/Subjective: Patient had multiple bowel movements.  No nausea or vomiting.  Abdominal pain has resolved.  Came in with partial small bowel obstruction.  Objective: Vitals:   07/04/20 2050 07/05/20 0435  BP: (!) 143/70 (!) 138/74  Pulse: 64 69  Resp: 16 16  Temp: 98.4 F (36.9 C) 98.3 F (36.8 C)  SpO2: 99% 98%    Intake/Output Summary (Last 24 hours) at 07/05/2020 1254 Last data filed at 07/05/2020 0940 Gross per 24 hour  Intake 2158.6 ml  Output 1900 ml  Net 258.6 ml   Filed Weights   07/03/20 0635  Weight: 108.9 kg    ROS: Review of Systems  Respiratory: Negative for shortness of breath.   Cardiovascular: Negative for chest pain.  Gastrointestinal: Negative for abdominal pain, nausea and vomiting.   Exam: Physical Exam HENT:     Head: Normocephalic.     Nose: No mucosal edema.     Mouth/Throat:     Pharynx: No oropharyngeal exudate.  Cardiovascular:     Rate and Rhythm: Normal rate and regular rhythm.     Heart sounds: Normal heart sounds, S1 normal and S2 normal.  Pulmonary:     Breath sounds: No decreased breath sounds, wheezing, rhonchi or rales.  Abdominal:     Palpations: Abdomen is soft.     Tenderness: There is no abdominal tenderness.  Musculoskeletal:     Right ankle: Swelling present.     Left ankle: Swelling present.  Skin:    General: Skin is warm.     Findings: Rash present.  Neurological:     Mental Status: She is alert and oriented to person, place, and time.       Data Reviewed: Basic Metabolic Panel: Recent Labs  Lab 07/03/20 0650 07/04/20 0458  NA 142 145  K 3.6 3.3*  CL 108 109  CO2 28 27  GLUCOSE 112* 90  BUN 20 13  CREATININE 1.08* 0.95  CALCIUM 9.1 8.4*   Liver Function Tests: Recent Labs  Lab 07/03/20 0650   AST 20  ALT 18  ALKPHOS 73  BILITOT 0.6  PROT 7.8  ALBUMIN 4.3   Recent Labs  Lab 07/03/20 0650  LIPASE 23   CBC: Recent Labs  Lab 07/03/20 0650 07/04/20 0458  WBC 13.5* 8.7  NEUTROABS 9.3*  --   HGB 12.4 10.6*  HCT 39.3 34.3*  MCV 88.9 89.8  PLT 268 221     Recent Results (from the past 240 hour(s))  SARS Coronavirus 2 by RT PCR (hospital order, performed in [facility] hospital lab) Nasopharyngeal Nasopharyngeal Swab     Status: None   Collection Time: 07/03/20 10:31 AM   Specimen: Nasopharyngeal Swab  Result Value Ref Range Status   SARS Coronavirus 2 NEGATIVE NEGATIVE Final    Comment: (NOTE) SARS-CoV-2 target nucleic acids are NOT DETECTED.  The SARS-CoV-2 RNA is generally detectable in upper and lower respiratory specimens during the acute phase of infection. The lowest concentration of SARS-CoV-2 viral copies this assay can detect is 250 copies / mL. A negative result does not preclude SARS-CoV-2 infection and should not be used as the sole basis for treatment or other patient management decisions.  A negative result may occur with improper specimen collection / handling, submission of specimen other than nasopharyngeal swab,  presence of viral mutation(s) within the areas targeted by this assay, and inadequate number of viral copies (<250 copies / mL). A negative result must be combined with clinical observations, patient history, and epidemiological information.  Fact Sheet for Patients:   StrictlyIdeas.no  Fact Sheet for Healthcare Providers: BankingDealers.co.za  This test is not yet approved or  cleared by the [place] FDA and has been authorized for detection and/or diagnosis of SARS-CoV-2 by FDA under an Emergency Use Authorization (EUA).  This EUA will remain in effect (meaning this test can be used) for the duration of the COVID-19 declaration under Section 564(b)(1) of the Act, 21  U.S.C. section 360bbb-3(b)(1), unless the authorization is terminated or revoked sooner.  Performed at [facility], [address] [place], [place] [address]      Scheduled Meds: . enoxaparin (LOVENOX) injection  40 mg Subcutaneous Q24H  . pantoprazole (PROTONIX) IV  40 mg Intravenous Q24H  . potassium chloride  40 mEq Oral Once  . sodium chloride flush  3 mL Intravenous Q12H   Continuous Infusions: . 0.9 % NaCl with KCl 20 mEq / L Stopped (07/05/20 7846)    Assessment/Plan:  1. Partial small bowel obstruction.  NG tube is out.  General surgery advance to full liquid diet today.  Will advance to solid food for tonight.  Potential disposition tomorrow if tolerates solid food. 2. Hypokalemia.  We will give oral potassium and potassium and IV fluids. 3. Essential hypertension.  Blood pressure okay off Norvasc for right now.  Can restart if blood pressure is high. 4. GERD on Protonix 5. Chronic lymphedema. 6. Morbid obesity with a BMI of 41.20    Code Status:     Code Status Orders  (From admission, onward)         Start     Ordered   07/03/20 1039  Full code  Continuous        07/03/20 1049        Code Status History    This patient has a current code status but no historical code status.   Advance Care Planning Activity     Disposition Plan: Status is: Inpatient  Dispo: The patient is from: Home              Anticipated d/c is to: Home              Anticipated d/c date is: Likely 07/06/2020              Patient currently being treated for partial small bowel obstruction.  Patient now having bowel movements and will advance diet today and likely disposition tomorrow if tolerates advance diet.  Consultants:  General surgery  Time spent: 26 minutes  Dunn Loring

## 2020-07-06 MED ORDER — POTASSIUM CHLORIDE CRYS ER 20 MEQ PO TBCR
40.0000 meq | EXTENDED_RELEASE_TABLET | Freq: Once | ORAL | Status: AC
  Administered 2020-07-06: 40 meq via ORAL
  Filled 2020-07-06: qty 2

## 2020-07-06 NOTE — Discharge Instructions (Signed)
Bowel Obstruction °A bowel obstruction means that something is blocking the small or large bowel. The bowel is also called the intestine. It is the long tube that connects the stomach to the opening of the butt (anus). When something blocks the bowel, food and fluids cannot pass through like normal. This condition needs to be treated. Treatment depends on the cause of the problem and how bad the problem is. °What are the causes? °Common causes of this condition include: °· Scar tissue (adhesions) from past surgery or from high-energy X-rays (radiation). °· Recent surgery in the belly. This affects how food moves in the bowel. °· Some diseases, such as: °? Irritation of the lining of the digestive tract (Crohn's disease). °? Irritation of small pouches in the bowel (diverticulitis). °· Growths or tumors. °· A bulging organ (hernia). °· Twisting of the bowel (volvulus). °· A foreign body. °· Slipping of a part of the bowel into another part (intussusception). °What are the signs or symptoms? °Symptoms of this condition include: °· Pain in the belly. °· Feeling sick to your stomach (nauseous). °· Throwing up (vomiting). °· Bloating in the belly. °· Being unable to pass gas. °· Trouble pooping (constipation). °· Watery poop (diarrhea). °· A lot of belching. °How is this diagnosed? °This condition may be diagnosed based on: °· A physical exam. °· Medical history. °· Imaging tests, such as X-ray or CT scan. °· Blood tests. °· Urine tests. °How is this treated? °Treatment for this condition may include: °· Fluids and pain medicines that are given through an IV tube. Your doctor may tell you not to eat or drink if you feel sick to your stomach and are throwing up. °· Eating a clear liquid diet for a few days. °· Putting a small tube (nasogastric tube) into the stomach. This will help with pain, discomfort, and nausea by removing blocked air and fluids from the stomach. °· Surgery. This may be needed if other treatments do  not work. °Follow these instructions at home: °Medicines °· Take over-the-counter and prescription medicines only as told by your doctor. °· If you were prescribed an antibiotic medicine, take it as told by your doctor. Do not stop taking the antibiotic even if you start to feel better. °General instructions °· Follow your diet as told by your doctor. You may need to: °? Only drink clear liquids until you start to get better. °? Avoid solid foods. °· Return to your normal activities as told by your doctor. Ask your doctor what activities are safe for you. °· Do not sit for a long time without moving. Get up to take short walks every 1-2 hours. This is important. Ask for help if you feel weak or unsteady. °· Keep all follow-up visits as told by your doctor. This is important. °How is this prevented? °After having a bowel obstruction, you may be more likely to have another. You can do some things to stop it from happening again. °· If you have a long-term (chronic) disease, contact your doctor if you see changes or problems. °· Take steps to prevent or treat trouble pooping. Your doctor may ask that you: °? Drink enough fluid to keep your pee (urine) pale yellow. °? Take over-the-counter or prescription medicines. °? Eat foods that are high in fiber. These include beans, whole grains, and fresh fruits and vegetables. °? Limit foods that are high in fat and sugar. These include fried or sweet foods. °· Stay active. Ask your doctor which exercises are   safe for you. °· Avoid stress. °· Eat three small meals and three small snacks each day. °· Work with a food expert (dietitian) to make a meal plan that works for you. °· Do not use any products that contain nicotine or tobacco, such as cigarettes and e-cigarettes. If you need help quitting, ask your doctor. °Contact a doctor if: °· You have a fever. °· You have chills. °Get help right away if: °· You have pain or cramps that get worse. °· You throw up blood. °· You are  sick to your stomach. °· You cannot stop throwing up. °· You cannot drink fluids. °· You feel mixed up (confused). °· You feel very thirsty (dehydrated). °· Your belly gets more bloated. °· You feel weak or you pass out (faint). °Summary °· A bowel obstruction means that something is blocking the small or large bowel. °· Treatment may include IV fluids and pain medicine. You may also have a clear liquid diet, a small tube in your stomach, or surgery. °· Drink clear liquids and avoid solid foods until you get better. °This information is not intended to replace advice given to you by your health care provider. Make sure you discuss any questions you have with your health care provider. °Document Revised: 04/12/2018 Document Reviewed: 04/12/2018 °Elsevier Patient Education © 2020 [employer] ° °

## 2020-07-06 NOTE — Progress Notes (Signed)
[facility] Day(s): 3.   Post op day(s):  Marland Kitchen   Interval History: Patient seen and examined, no acute events or new complaints overnight. Patient reports feeling good this morning.  Patient reports she tolerated diet without abdominal pain, nausea or vomiting.  There is no abdominal pain today.  There is no alleviating or aggravating factor.  There is no pain radiation.  Patient continue passing gas and having bowel movement.  Vital signs in last 24 hours: [min-max] current  Temp:  [97.4 F (36.3 C)-98.4 F (36.9 C)] 98.4 F (36.9 C) (07/25 0519) Pulse Rate:  [61-72] 63 (07/25 0519) Resp:  [16] 16 (07/25 0519) BP: (101-120)/(49-58) 101/49 (07/25 0519) SpO2:  [98 %-100 %] 100 % (07/25 0519)     Height: 5\' 4"  (162.6 cm) Weight: 108.9 kg BMI (Calculated): 41.18   Physical Exam:  Constitutional: alert, cooperative and no distress  Respiratory: breathing non-labored at rest  Cardiovascular: regular rate and sinus rhythm  Gastrointestinal: soft, non-tender, and non-distended  Labs:  CBC Latest Ref Rng & Units 07/04/2020 07/03/2020 03/19/2020  WBC 4.0 - 10.5 K/uL 8.7 13.5(H) 8.6  Hemoglobin 12.0 - 15.0 g/dL 10.6(L) 12.4 11.4(L)  Hematocrit 36 - 46 % 34.3(L) 39.3 35.2(L)  Platelets 150 - 400 K/uL 221 268 241.0   CMP Latest Ref Rng & Units 07/04/2020 07/03/2020 03/19/2020  Glucose 70 - 99 mg/dL 90 112(H) 97  BUN 8 - 23 mg/dL 13 20 20   Creatinine 0.44 - 1.00 mg/dL 0.95 1.08(H) 0.92  Sodium 135 - 145 mmol/L 145 142 143  Potassium 3.5 - 5.1 mmol/L 3.3(L) 3.6 3.8  Chloride 98 - 111 mmol/L 109 108 108  CO2 22 - 32 mmol/L 27 28 29   Calcium 8.9 - 10.3 mg/dL 8.4(L) 9.1 8.9  Total Protein 6.5 - 8.1 g/dL - 7.8 6.4  Total Bilirubin 0.3 - 1.2 mg/dL - 0.6 0.5  Alkaline Phos 38 - 126 U/L - 73 73  AST 15 - 41 U/L - 20 15  ALT 0 - 44 U/L - 18 17    Imaging studies: No new pertinent imaging studies   Assessment/Plan:  69 y.o. female with small bowel obstruction, complicated  by pertinent comorbidities including GERD, sleep apnea, obesity.  Patient with resolved SBO.  Patient tolerating regular diet.  Patient having bowel movement and passing gas.  There is no abdominal pain.  From surgery standpoint there is no contraindication for discharge.  Since patient resolved without any intervention patient can be followed by primary care as needed.  No further surgical management.  Arnold Long, MD

## 2020-07-06 NOTE — Discharge Summary (Signed)
[facility] NAME: Ann Barker    MR#:  [id number]  DATE OF BIRTH:  11-01-1951  DATE OF ADMISSION:  07/03/2020 ADMITTING PHYSICIAN: Collier Bullock, MD  DATE OF DISCHARGE: 07/06/2020  PRIMARY CARE PHYSICIAN: Pleas Koch, NP    ADMISSION DIAGNOSIS:  Partial small bowel obstruction (HCC) [K56.600]  DISCHARGE DIAGNOSIS:  Principal Problem:   Partial small bowel obstruction (HCC) Active Problems:   Essential hypertension   Morbid obesity (HCC)   GERD (gastroesophageal reflux disease)   Hypokalemia   SECONDARY DIAGNOSIS:   Past Medical History:  Diagnosis Date  . Acid reflux   . Acute pain of right foot 10/08/2019  . Anemia   . Arthritis   . Cancer ([place]) 1993   SKIN CANCER  . Complication of anesthesia    HARD TO WAKE UP  . Concussion 01/2017  . Dizziness   . Family history of adverse reaction to anesthesia    PT UNSURE OF FAMILY HISTORY  . Gallstones   . Hematuria    microscopic  . History of hiatal hernia   . History of kidney stones    H/O  . HLD (hyperlipidemia)   . Hoarseness   . Hypertension   . Lower extremity edema    pt states this is chronic and has been told she has lymph edema-wears compression stockings  . Lower leg edema   . Over weight   . PONV (postoperative nausea and vomiting)   . Renal cyst   . Seizures (HCC)    AS A CHILD AND THEN WITH 1 PREGNANCY BUT NONE SINCE  . Sleep apnea    NO CPAP  . Symptomatic cholelithiasis 04/14/2018  . Urge incontinence   . UTI (lower urinary tract infection)   . Vitamin D deficiency     HOSPITAL COURSE:   1.  Partial small bowel obstruction.  Patient initially admitted with NG tube and n.p.o. and given IV fluids.  NG tube was removed and patient tolerated gradually increasing diet.  Last night tolerated solid food and again this morning.  Stable for discharge home. 2.  Hypokalemia secondary to being n.p.o. and on clear liquids for a while.  Patient  was replaced potassium orally and in IV fluids. 3.  Essential hypertension.  Blood pressure okay off Norvasc right now.  Recheck blood pressure as outpatient. 4.  GERD on Protonix 5.  Chronic lymphedema. 6.  Morbid obesity with a BMI of 41.20  DISCHARGE CONDITIONS:   Satisfactory  CONSULTS OBTAINED:  Treatment Team:  Ronny Bacon, MD  DRUG ALLERGIES:   Allergies  Allergen Reactions  . Morphine Other (See Comments)    Unknown  . Clindamycin/Lincomycin Diarrhea  . Methylprednisolone Swelling  . Codeine Palpitations and Other (See Comments)    unknown  . Morphine And Related Palpitations  . Penicillins Rash and Other (See Comments)    unknown    DISCHARGE MEDICATIONS:   Allergies as of 07/06/2020      Reactions   Morphine Other (See Comments)   Unknown   Clindamycin/lincomycin Diarrhea   Methylprednisolone Swelling   Codeine Palpitations, Other (See Comments)   unknown   Morphine And Related Palpitations   Penicillins Rash, Other (See Comments)   unknown      Medication List    STOP taking these medications   amLODipine 10 MG tablet Commonly known as: NORVASC   oxybutynin 5 MG 24 hr tablet Commonly known as: DITROPAN-XL     TAKE  these medications   cholecalciferol 1000 units tablet Commonly known as: VITAMIN D Take 1,000 Units by mouth daily.   folic acid 182 MCG tablet Commonly known as: FOLVITE Take 800 mcg by mouth daily.   multivitamin capsule Take 1 capsule by mouth daily.   omeprazole 20 MG capsule Commonly known as: PRILOSEC TAKE 1 CAPSULE (20 MG TOTAL) BY MOUTH DAILY. FOR HEARTBURN.   torsemide 5 MG tablet Commonly known as: DEMADEX Take 5 mg by mouth daily.        DISCHARGE INSTRUCTIONS:   Follow-up PMD 5 days  If you experience worsening of your admission symptoms, develop shortness of breath, life threatening emergency, suicidal or homicidal thoughts you must seek medical attention immediately by calling 911 or calling your  MD immediately  if symptoms less severe.  You Must read complete instructions/literature along with all the possible adverse reactions/side effects for all the Medicines you take and that have been prescribed to you. Take any new Medicines after you have completely understood and accept all the possible adverse reactions/side effects.   Please note  You were cared for by a hospitalist during your hospital stay. If you have any questions about your discharge medications or the care you received while you were in the hospital after you are discharged, you can call the unit and asked to speak with the hospitalist on call if the hospitalist that took care of you is not available. Once you are discharged, your primary care physician will handle any further medical issues. Please note that NO REFILLS for any discharge medications will be authorized once you are discharged, as it is imperative that you return to your primary care physician (or establish a relationship with a primary care physician if you do not have one) for your aftercare needs so that they can reassess your need for medications and monitor your lab values.    Today   CHIEF COMPLAINT:   Chief Complaint  Patient presents with  . Abdominal Pain    HISTORY OF PRESENT ILLNESS:  Ann Barker  is a 69 y.o. female came in with abdominal pain and nausea vomiting and found to have partial small bowel obstruction   VITAL SIGNS:  Blood pressure (!) 101/49, pulse 63, temperature 98.4 F (36.9 C), temperature source Oral, resp. rate 16, height 5\' 4"  (1.626 m), weight 108.9 kg, SpO2 100 %.  I/O:    Intake/Output Summary (Last 24 hours) at 07/06/2020 1252 Last data filed at 07/06/2020 9937 Gross per 24 hour  Intake 1177.28 ml  Output 1600 ml  Net -422.72 ml    PHYSICAL EXAMINATION:  GENERAL:  69 y.o.-year-old patient lying in the bed with no acute distress.  EYES: Pupils equal, round, reactive to light and accommodation. No scleral  icterus. HEENT: Head atraumatic, normocephalic. Oropharynx and nasopharynx clear.   LUNGS: Normal breath sounds bilaterally, no wheezing, rales,rhonchi or crepitation. No use of accessory muscles of respiration.  CARDIOVASCULAR: S1, S2 normal. No murmurs, rubs, or gallops.  ABDOMEN: Soft, non-tender, non-distended. EXTREMITIES: 2+ chronic lymphedema.  NEUROLOGIC: Cranial nerves II through XII are intact. PSYCHIATRIC: The patient is alert and oriented x 3.  SKIN: No obvious rash, lesion, or ulcer.   DATA REVIEW:   CBC Recent Labs  Lab 07/04/20 0458  WBC 8.7  HGB 10.6*  HCT 34.3*  PLT 221    Chemistries  Recent Labs  Lab 07/03/20 0650 07/03/20 0650 07/04/20 0458  NA 142   < > 145  K 3.6   < >  3.3*  CL 108   < > 109  CO2 28   < > 27  GLUCOSE 112*   < > 90  BUN 20   < > 13  CREATININE 1.08*   < > 0.95  CALCIUM 9.1   < > 8.4*  AST 20  --   --   ALT 18  --   --   ALKPHOS 73  --   --   BILITOT 0.6  --   --    < > = values in this interval not displayed.     Microbiology Results  Results for orders placed or performed during the hospital encounter of 07/03/20  SARS Coronavirus 2 by RT PCR (hospital order, performed in [facility] hospital lab) Nasopharyngeal Nasopharyngeal Swab     Status: None   Collection Time: 07/03/20 10:31 AM   Specimen: Nasopharyngeal Swab  Result Value Ref Range Status   SARS Coronavirus 2 NEGATIVE NEGATIVE Final    Comment: (NOTE) SARS-CoV-2 target nucleic acids are NOT DETECTED.  The SARS-CoV-2 RNA is generally detectable in upper and lower respiratory specimens during the acute phase of infection. The lowest concentration of SARS-CoV-2 viral copies this assay can detect is 250 copies / mL. A negative result does not preclude SARS-CoV-2 infection and should not be used as the sole basis for treatment or other patient management decisions.  A negative result may occur with improper specimen collection / handling, submission of specimen  other than nasopharyngeal swab, presence of viral mutation(s) within the areas targeted by this assay, and inadequate number of viral copies (<250 copies / mL). A negative result must be combined with clinical observations, patient history, and epidemiological information.  Fact Sheet for Patients:   StrictlyIdeas.no  Fact Sheet for Healthcare Providers: BankingDealers.co.za  This test is not yet approved or  cleared by the [place] FDA and has been authorized for detection and/or diagnosis of SARS-CoV-2 by FDA under an Emergency Use Authorization (EUA).  This EUA will remain in effect (meaning this test can be used) for the duration of the COVID-19 declaration under Section 564(b)(1) of the Act, 21 U.S.C. section 360bbb-3(b)(1), unless the authorization is terminated or revoked sooner.  Performed at [facility], [address] [place], [place] [address]       Management plans discussed with the patient, she has in agreement.  CODE STATUS:     Code Status Orders  (From admission, onward)         Start     Ordered   07/03/20 1039  Full code  Continuous        07/03/20 1049        Code Status History    This patient has a current code status but no historical code status.   Advance Care Planning Activity      TOTAL TIME TAKING CARE OF THIS PATIENT: 32 minutes.    Loletha Grayer M.D on 07/06/2020 at 12:52 PM  Between 7am to 6pm - Pager - [phone]  After 6pm go to [email or web address] - password [email or web address]  [facility]  CC: Primary care physician; Pleas Koch, NP

## 2020-07-07 ENCOUNTER — Telehealth: Payer: Self-pay

## 2020-07-07 NOTE — Telephone Encounter (Signed)
1st attempt- Called patient to complete TCM and schedule hospital follow up visit. Patient never answered. Mailbox is full and can't leave message at this time.

## 2020-07-07 NOTE — Telephone Encounter (Signed)
Noted, will evaluate. 

## 2020-07-07 NOTE — Telephone Encounter (Signed)
Transition Care Management Follow-up Telephone Call  Date of discharge and from where: 07/06/2020, [facility]  How have you been since you were released from the hospital? Patient states that she is still having pain all over and wants to see provider tomorrow if possible  Any questions or concerns? No   Items Reviewed:  Did the pt receive and understand the discharge instructions provided? Yes   Medications obtained and verified? Yes   Any new allergies since your discharge? No   Dietary orders reviewed? Yes  Do you have support at home? Yes   Functional Questionnaire: (I = Independent and D = Dependent) ADLs: I  Bathing/Dressing- I  Meal Prep- I  Eating- I  Maintaining continence- I  Transferring/Ambulation- I  Managing Meds- I  Follow up appointments reviewed:   PCP Hospital f/u appt confirmed? Yes  Scheduled to see [name], NP on 07/08/2020 @ 12 pm.  [facility] f/u appt confirmed? N/A   Are transportation arrangements needed? No   If their condition worsens, is the pt aware to call PCP or go to the Emergency Dept.? Yes  Was the patient provided with contact information for the PCP's office or ED? Yes  Was to pt encouraged to call back with questions or concerns? Yes

## 2020-07-08 ENCOUNTER — Ambulatory Visit: Payer: Medicare HMO | Admitting: Primary Care

## 2020-07-09 ENCOUNTER — Other Ambulatory Visit: Payer: Self-pay

## 2020-07-09 ENCOUNTER — Encounter: Payer: Self-pay | Admitting: *Deleted

## 2020-07-09 ENCOUNTER — Encounter: Payer: Self-pay | Admitting: Primary Care

## 2020-07-09 ENCOUNTER — Ambulatory Visit (INDEPENDENT_AMBULATORY_CARE_PROVIDER_SITE_OTHER): Payer: Medicare HMO | Admitting: Primary Care

## 2020-07-09 DIAGNOSIS — K56600 Partial intestinal obstruction, unspecified as to cause: Secondary | ICD-10-CM | POA: Diagnosis not present

## 2020-07-09 LAB — BASIC METABOLIC PANEL
BUN: 17 mg/dL (ref 6–23)
CO2: 27 mEq/L (ref 19–32)
Calcium: 9.2 mg/dL (ref 8.4–10.5)
Chloride: 109 mEq/L (ref 96–112)
Creatinine, Ser: 0.97 mg/dL (ref 0.40–1.20)
GFR: 68.5 mL/min (ref >60.00)
Glucose, Bld: 100 mg/dL — ABNORMAL HIGH (ref 70–99)
Potassium: 3.8 mEq/L (ref 3.5–5.1)
Sodium: 141 mEq/L (ref 135–145)

## 2020-07-09 LAB — CBC
HCT: 36.5 % (ref 36.0–46.0)
Hemoglobin: 11.8 g/dL — ABNORMAL LOW (ref 12.0–15.0)
MCHC: 32.5 g/dL (ref 30.0–36.0)
MCV: 87 fl (ref 78.0–100.0)
Platelets: 240 10*3/uL (ref 150.0–400.0)
RBC: 4.19 Mil/uL (ref 3.87–5.11)
RDW: 14.1 % (ref 11.5–15.5)
WBC: 8.1 10*3/uL (ref 4.0–10.5)

## 2020-07-09 NOTE — Progress Notes (Signed)
Subjective:    Patient ID: Ann Barker, female    DOB: 05-09-1951, 69 y.o.   MRN: [id number]  HPI  This visit occurred during the SARS-CoV-2 public health emergency.  Safety protocols were in place, including screening questions prior to the visit, additional usage of staff PPE, and extensive cleaning of exam room while observing appropriate contact time as indicated for disinfecting solutions.   Ann Barker is a 69 year old female  She presented to [facility] ED on 07/03/20 with a chief complaint of abdominal pain. Her pina was located to the left lower abdomen with radiation towards the back. Work up in the ED was consistent for partial small bowel obstruction vs ileus. General surgery was consulted who recommended admission.  During her hospital stay she was kept NPO. She was treated with IV fluids, PPI treatment, antiemetics. NG tube was placed. She was noted to have hypokalemia so IV potassium was added. NG tube was clamped and she did well. She was able to tolerate a clear liquid diet, also passing gas and having bowel movements. She advanced to solid food, tolerated well, NG tube was removed. She was discharged home on 07/06/20.  Since her discharge home she's feeling better. She's having bowel movements twice daily since she's been home. She is also passing gas, is eating well, staying hydrated. She denies fevers, nausea, diarrhea.   Review of Systems  Constitutional: Negative for fever.  Respiratory: Negative for shortness of breath.   Cardiovascular: Negative for chest pain.  Gastrointestinal: Negative for abdominal pain, constipation, diarrhea, nausea and vomiting.       Past Medical History:  Diagnosis Date  . Acid reflux   . Acute pain of right foot 10/08/2019  . Anemia   . Arthritis   . Cancer ([place]) 1993   SKIN CANCER  . Complication of anesthesia    HARD TO WAKE UP  . Concussion 01/2017  . Dizziness   . Family history of adverse reaction to anesthesia    PT UNSURE OF  FAMILY HISTORY  . Gallstones   . Hematuria    microscopic  . History of hiatal hernia   . History of kidney stones    H/O  . HLD (hyperlipidemia)   . Hoarseness   . Hypertension   . Lower extremity edema    pt states this is chronic and has been told she has lymph edema-wears compression stockings  . Lower leg edema   . Over weight   . PONV (postoperative nausea and vomiting)   . Renal cyst   . Seizures (HCC)    AS A CHILD AND THEN WITH 1 PREGNANCY BUT NONE SINCE  . Sleep apnea    NO CPAP  . Symptomatic cholelithiasis 04/14/2018  . Urge incontinence   . UTI (lower urinary tract infection)   . Vitamin D deficiency      Social History   Socioeconomic History  . Marital status: Married    Spouse name: Not on file  . Number of children: Not on file  . Years of education: Not on file  . Highest education level: Not on file  Occupational History  . Not on file  Tobacco Use  . Smoking status: Never Smoker  . Smokeless tobacco: Never Used  Vaping Use  . Vaping Use: Never used  Substance and Sexual Activity  . Alcohol use: No    Alcohol/week: 0.0 standard drinks  . Drug use: No  . Sexual activity: Not on file  Other Topics  Concern  . Not on file  Social History Narrative  . Not on file   Social Determinants of Health   Financial Resource Strain: Low Risk   . Difficulty of Paying Living Expenses: Not hard at all  Food Insecurity: No Food Insecurity  . Worried About Charity fundraiser in the Last Year: Never true  . Ran Out of Food in the Last Year: Never true  Transportation Needs: No Transportation Needs  . Lack of Transportation (Medical): No  . Lack of Transportation (Non-Medical): No  Physical Activity: Inactive  . Days of Exercise per Week: 0 days  . Minutes of Exercise per Session: 0 min  Stress: Stress Concern Present  . Feeling of Stress : To some extent  Social Connections:   . Frequency of Communication with Friends and Family:   . Frequency of  Social Gatherings with Friends and Family:   . Attends Religious Services:   . Active Member of Clubs or Organizations:   . Attends Archivist Meetings:   Marland Kitchen Marital Status:   Intimate Partner Violence: Not At Risk  . Fear of Current or Ex-Partner: No  . Emotionally Abused: No  . Physically Abused: No  . Sexually Abused: No    Past Surgical History:  Procedure Laterality Date  . ABDOMINAL HYSTERECTOMY  1981  . BREAST BIOPSY  1995/2005  . CHOLECYSTECTOMY N/A 05/10/2018   Procedure: LAPAROSCOPIC CHOLECYSTECTOMY;  Surgeon: Vickie Epley, MD;  Location: [facility] ORS;  Service: General;  Laterality: N/A;  . COLONOSCOPY WITH PROPOFOL N/A 02/20/2019   Procedure: COLONOSCOPY WITH PROPOFOL;  Surgeon: Lucilla Lame, MD;  Location: [facility] ENDOSCOPY;  Service: Endoscopy;  Laterality: N/A;  . HERNIA REPAIR  2005  . LAPAROSCOPIC LYSIS OF ADHESIONS N/A 05/10/2018   Procedure: LAPAROSCOPIC LYSIS OF ADHESIONS;  Surgeon: Vickie Epley, MD;  Location: [facility] ORS;  Service: General;  Laterality: N/A;  . TUBAL LIGATION  1975    Family History  Problem Relation Age of Onset  . Cancer Paternal Aunt   . Stroke Paternal Uncle   . Prostate cancer Neg Hx   . Kidney disease Neg Hx   . Bladder Cancer Neg Hx     Allergies  Allergen Reactions  . Morphine Other (See Comments)    Unknown  . Clindamycin/Lincomycin Diarrhea  . Methylprednisolone Swelling  . Codeine Palpitations and Other (See Comments)    unknown  . Morphine And Related Palpitations  . Penicillins Rash and Other (See Comments)    unknown    Current Outpatient Medications on File Prior to Visit  Medication Sig Dispense Refill  . cholecalciferol (VITAMIN D) 1000 units tablet Take 1,000 Units by mouth daily.    . folic acid (FOLVITE) 350 MCG tablet Take 800 mcg by mouth daily.    . Multiple Vitamin (MULTIVITAMIN) capsule Take 1 capsule by mouth daily.    Marland Kitchen omeprazole (PRILOSEC) 20 MG capsule TAKE 1 CAPSULE (20 MG TOTAL) BY MOUTH  DAILY. FOR HEARTBURN. 90 capsule 0  . torsemide (DEMADEX) 5 MG tablet Take 5 mg by mouth daily.     No current facility-administered medications on file prior to visit.    BP 126/80   Pulse 86   Temp (!) 96.3 F (35.7 C) (Temporal)   Ht 5\' 4"  (1.626 m)   Wt (!) 254 lb 8 oz (115.4 kg)   SpO2 97%   BMI 43.68 kg/m    Objective:   Physical Exam Cardiovascular:     Rate and Rhythm:  Normal rate and regular rhythm.  Pulmonary:     Effort: Pulmonary effort is normal.     Breath sounds: Normal breath sounds.  Abdominal:     General: Abdomen is flat. Bowel sounds are normal.     Tenderness: There is no abdominal tenderness.  Musculoskeletal:     Cervical back: Neck supple.  Skin:    General: Skin is warm and dry.            Assessment & Plan:

## 2020-07-09 NOTE — Patient Instructions (Addendum)
Stop by the lab prior to leaving today. I will notify you of your results once received.   Please notify me if you develop those symptoms again.   Make sure you are passing gas and bowels frequently as your usual.   It was a pleasure to see you today!   Bowel Obstruction A bowel obstruction means that something is blocking the small or large bowel. The bowel is also called the intestine. It is the long tube that connects the stomach to the opening of the butt (anus). When something blocks the bowel, food and fluids cannot pass through like normal. This condition needs to be treated. Treatment depends on the cause of the problem and how bad the problem is. What are the causes? Common causes of this condition include:  Scar tissue (adhesions) from past surgery or from high-energy X-rays (radiation).  Recent surgery in the belly. This affects how food moves in the bowel.  Some diseases, such as: ? Irritation of the lining of the digestive tract (Crohn's disease). ? Irritation of small pouches in the bowel (diverticulitis).  Growths or tumors.  A bulging organ (hernia).  Twisting of the bowel (volvulus).  A foreign body.  Slipping of a part of the bowel into another part (intussusception). What are the signs or symptoms? Symptoms of this condition include:  Pain in the belly.  Feeling sick to your stomach (nauseous).  Throwing up (vomiting).  Bloating in the belly.  Being unable to pass gas.  Trouble pooping (constipation).  Watery poop (diarrhea).  A lot of belching. How is this diagnosed? This condition may be diagnosed based on:  A physical exam.  Medical history.  Imaging tests, such as X-ray or CT scan.  Blood tests.  Urine tests. How is this treated? Treatment for this condition may include:  Fluids and pain medicines that are given through an IV tube. Your doctor may tell you not to eat or drink if you feel sick to your stomach and are throwing  up.  Eating a clear liquid diet for a few days.  Putting a small tube (nasogastric tube) into the stomach. This will help with pain, discomfort, and nausea by removing blocked air and fluids from the stomach.  Surgery. This may be needed if other treatments do not work. Follow these instructions at home: Medicines  Take over-the-counter and prescription medicines only as told by your doctor.  If you were prescribed an antibiotic medicine, take it as told by your doctor. Do not stop taking the antibiotic even if you start to feel better. General instructions  Follow your diet as told by your doctor. You may need to: ? Only drink clear liquids until you start to get better. ? Avoid solid foods.  Return to your normal activities as told by your doctor. Ask your doctor what activities are safe for you.  Do not sit for a long time without moving. Get up to take short walks every 1-2 hours. This is important. Ask for help if you feel weak or unsteady.  Keep all follow-up visits as told by your doctor. This is important. How is this prevented? After having a bowel obstruction, you may be more likely to have another. You can do some things to stop it from happening again.  If you have a long-term (chronic) disease, contact your doctor if you see changes or problems.  Take steps to prevent or treat trouble pooping. Your doctor may ask that you: ? Drink enough fluid to keep  your pee (urine) pale yellow. ? Take over-the-counter or prescription medicines. ? Eat foods that are high in fiber. These include beans, whole grains, and fresh fruits and vegetables. ? Limit foods that are high in fat and sugar. These include fried or sweet foods.  Stay active. Ask your doctor which exercises are safe for you.  Avoid stress.  Eat three small meals and three small snacks each day.  Work with a Publishing rights manager (dietitian) to make a meal plan that works for you.  Do not use any products that contain  nicotine or tobacco, such as cigarettes and e-cigarettes. If you need help quitting, ask your doctor. Contact a doctor if:  You have a fever.  You have chills. Get help right away if:  You have pain or cramps that get worse.  You throw up blood.  You are sick to your stomach.  You cannot stop throwing up.  You cannot drink fluids.  You feel mixed up (confused).  You feel very thirsty (dehydrated).  Your belly gets more bloated.  You feel weak or you pass out (faint). Summary  A bowel obstruction means that something is blocking the small or large bowel.  Treatment may include IV fluids and pain medicine. You may also have a clear liquid diet, a small tube in your stomach, or surgery.  Drink clear liquids and avoid solid foods until you get better. This information is not intended to replace advice given to you by your health care provider. Make sure you discuss any questions you have with your health care provider. Document Revised: 04/12/2018 Document Reviewed: 04/12/2018 Elsevier Patient Education  [place].

## 2020-07-09 NOTE — Assessment & Plan Note (Addendum)
Recent hospital admission for small bowel obstruction which resolved with conservative measures during her stay.  She seems to be doing much better now, is passing gas and having regular bowel movements.  Abdominal exam today with normal bowel sounds, no tenderness.   She understands to notify me immediately if she develops the same pain again. Repeat BMP and CBC pending.  Hospital labs, imaging, notes reviewed.

## 2020-07-15 DIAGNOSIS — I890 Lymphedema, not elsewhere classified: Secondary | ICD-10-CM | POA: Diagnosis not present

## 2020-07-22 ENCOUNTER — Encounter: Payer: Self-pay | Admitting: Intensive Care

## 2020-07-22 ENCOUNTER — Other Ambulatory Visit: Payer: Self-pay

## 2020-07-22 ENCOUNTER — Telehealth: Payer: Self-pay

## 2020-07-22 DIAGNOSIS — I10 Essential (primary) hypertension: Secondary | ICD-10-CM | POA: Diagnosis not present

## 2020-07-22 DIAGNOSIS — K5730 Diverticulosis of large intestine without perforation or abscess without bleeding: Secondary | ICD-10-CM | POA: Diagnosis not present

## 2020-07-22 DIAGNOSIS — E119 Type 2 diabetes mellitus without complications: Secondary | ICD-10-CM | POA: Insufficient documentation

## 2020-07-22 DIAGNOSIS — K5641 Fecal impaction: Secondary | ICD-10-CM | POA: Diagnosis not present

## 2020-07-22 DIAGNOSIS — N3001 Acute cystitis with hematuria: Secondary | ICD-10-CM | POA: Diagnosis not present

## 2020-07-22 DIAGNOSIS — R1032 Left lower quadrant pain: Secondary | ICD-10-CM | POA: Diagnosis present

## 2020-07-22 DIAGNOSIS — K439 Ventral hernia without obstruction or gangrene: Secondary | ICD-10-CM | POA: Diagnosis not present

## 2020-07-22 DIAGNOSIS — K5901 Slow transit constipation: Secondary | ICD-10-CM | POA: Diagnosis not present

## 2020-07-22 DIAGNOSIS — K219 Gastro-esophageal reflux disease without esophagitis: Secondary | ICD-10-CM | POA: Diagnosis not present

## 2020-07-22 DIAGNOSIS — Z85828 Personal history of other malignant neoplasm of skin: Secondary | ICD-10-CM | POA: Insufficient documentation

## 2020-07-22 DIAGNOSIS — Z79899 Other long term (current) drug therapy: Secondary | ICD-10-CM | POA: Insufficient documentation

## 2020-07-22 DIAGNOSIS — I700 Atherosclerosis of aorta: Secondary | ICD-10-CM | POA: Diagnosis not present

## 2020-07-22 LAB — CBC
HCT: 37.7 % (ref 36.0–46.0)
Hemoglobin: 11.7 g/dL — ABNORMAL LOW (ref 12.0–15.0)
MCH: 28.1 pg (ref 26.0–34.0)
MCHC: 31 g/dL (ref 30.0–36.0)
MCV: 90.4 fL (ref 80.0–100.0)
Platelets: 240 10*3/uL (ref 150–400)
RBC: 4.17 MIL/uL (ref 3.87–5.11)
RDW: 14.5 % (ref 11.5–15.5)
WBC: 9.9 10*3/uL (ref 4.0–10.5)
nRBC: 0 % (ref 0.0–0.2)

## 2020-07-22 LAB — URINALYSIS, COMPLETE (UACMP) WITH MICROSCOPIC
Bilirubin Urine: NEGATIVE
Glucose, UA: NEGATIVE mg/dL
Ketones, ur: NEGATIVE mg/dL
Nitrite: NEGATIVE
Protein, ur: NEGATIVE mg/dL
Specific Gravity, Urine: 1.015 (ref 1.005–1.030)
pH: 5 (ref 5.0–8.0)

## 2020-07-22 LAB — LIPASE, BLOOD: Lipase: 23 U/L (ref 11–51)

## 2020-07-22 LAB — COMPREHENSIVE METABOLIC PANEL
ALT: 17 U/L (ref 0–44)
AST: 18 U/L (ref 15–41)
Albumin: 3.9 g/dL (ref 3.5–5.0)
Alkaline Phosphatase: 72 U/L (ref 38–126)
Anion gap: 7 (ref 5–15)
BUN: 17 mg/dL (ref 8–23)
CO2: 26 mmol/L (ref 22–32)
Calcium: 8.6 mg/dL — ABNORMAL LOW (ref 8.9–10.3)
Chloride: 108 mmol/L (ref 98–111)
Creatinine, Ser: 0.86 mg/dL (ref 0.44–1.00)
GFR calc Af Amer: >60 mL/min (ref >60)
GFR calc non Af Amer: >60 mL/min (ref >60)
Glucose, Bld: 73 mg/dL (ref 70–99)
Potassium: 3.7 mmol/L (ref 3.5–5.1)
Sodium: 141 mmol/L (ref 135–145)
Total Bilirubin: 0.9 mg/dL (ref 0.3–1.2)
Total Protein: 7.5 g/dL (ref 6.5–8.1)

## 2020-07-22 NOTE — Telephone Encounter (Signed)
Noted, agree with advice given 

## 2020-07-22 NOTE — ED Triage Notes (Signed)
Patient c/o left lower abd pain that radiates down into leg. Reports recently admitted on 07/03/2020 for blocked bowels and had NG tube. Patients PCP sent her back to ER for xrays. Denies N/V/D

## 2020-07-22 NOTE — Telephone Encounter (Signed)
Pt said having pain in lt thigh on inside of leg; pt has pulling sensation in lower back also. Pt feels like someone is pushing in her belly. Pt said same symptoms as when went to ED few wks ago and pt had bowel blockage. Pt spoke with vein specialist this morning pt had surgery years ago in leg. Pt said pain level now is 7. Pt had HFU on 07/09/20 and did not have this severe of pain. Pt said the pain goes from bearable to severe. Pt will go to [facility] ED now. FYI to Avie Echevaria NP that is in office and Gentry Fitz NP as PCP.

## 2020-07-23 ENCOUNTER — Emergency Department: Payer: Medicare HMO

## 2020-07-23 ENCOUNTER — Emergency Department
Admission: EM | Admit: 2020-07-23 | Discharge: 2020-07-23 | Disposition: A | Discharge status: Home / Self Care | Payer: Medicare HMO | Attending: Emergency Medicine | Admitting: Emergency Medicine

## 2020-07-23 DIAGNOSIS — I700 Atherosclerosis of aorta: Secondary | ICD-10-CM | POA: Diagnosis not present

## 2020-07-23 DIAGNOSIS — K439 Ventral hernia without obstruction or gangrene: Secondary | ICD-10-CM | POA: Diagnosis not present

## 2020-07-23 DIAGNOSIS — K5730 Diverticulosis of large intestine without perforation or abscess without bleeding: Secondary | ICD-10-CM | POA: Diagnosis not present

## 2020-07-23 DIAGNOSIS — N3001 Acute cystitis with hematuria: Secondary | ICD-10-CM

## 2020-07-23 DIAGNOSIS — K5641 Fecal impaction: Secondary | ICD-10-CM | POA: Diagnosis not present

## 2020-07-23 DIAGNOSIS — K5901 Slow transit constipation: Secondary | ICD-10-CM

## 2020-07-23 MED ORDER — IOHEXOL 350 MG/ML SOLN
100.0000 mL | Freq: Once | INTRAVENOUS | Status: AC | PRN
  Administered 2020-07-23: 100 mL via INTRAVENOUS

## 2020-07-23 MED ORDER — LACTULOSE 10 GM/15ML PO SOLN
30.0000 g | Freq: Once | ORAL | Status: AC
  Administered 2020-07-23: 30 g via ORAL
  Filled 2020-07-23: qty 60

## 2020-07-23 MED ORDER — CEPHALEXIN 500 MG PO CAPS
500.0000 mg | ORAL_CAPSULE | Freq: Once | ORAL | Status: AC
  Administered 2020-07-23: 500 mg via ORAL
  Filled 2020-07-23: qty 1

## 2020-07-23 MED ORDER — CEPHALEXIN 500 MG PO CAPS
500.0000 mg | ORAL_CAPSULE | Freq: Three times a day (TID) | ORAL | 0 refills | Status: AC
Start: 2020-07-23 — End: 2020-07-30

## 2020-07-23 MED ORDER — LACTULOSE 20 G PO PACK: 20.0000 g | PACK | Freq: Every day | ORAL | 0 refills | Status: AC

## 2020-07-23 MED ORDER — ACETAMINOPHEN 500 MG PO TABS
1000.0000 mg | ORAL_TABLET | Freq: Once | ORAL | Status: AC
  Administered 2020-07-23: 1000 mg via ORAL
  Filled 2020-07-23: qty 2

## 2020-07-23 NOTE — ED Notes (Signed)
Patient discharged to home per MD order. Patient in stable condition, and deemed medically cleared by ED provider for discharge. Discharge instructions reviewed with patient/family using "Teach Back"; verbalized understanding of medication education and administration, and information about follow-up care. Denies further concerns. ° °

## 2020-07-23 NOTE — ED Notes (Signed)
Pt seen drinking out of [employer] cup, pt states earlier in the night while she was waiting she ate some chicken and creamed potatoes.
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
## 2020-07-24 LAB — URINE CULTURE: Culture: <10000 — AB

## 2020-08-07 DIAGNOSIS — R69 Illness, unspecified: Secondary | ICD-10-CM | POA: Diagnosis not present

## 2020-08-15 DIAGNOSIS — I890 Lymphedema, not elsewhere classified: Secondary | ICD-10-CM | POA: Diagnosis not present

## 2020-09-08 DIAGNOSIS — I83893 Varicose veins of bilateral lower extremities with other complications: Secondary | ICD-10-CM | POA: Diagnosis not present

## 2020-09-08 DIAGNOSIS — I890 Lymphedema, not elsewhere classified: Secondary | ICD-10-CM | POA: Diagnosis not present

## 2020-09-14 DIAGNOSIS — I890 Lymphedema, not elsewhere classified: Secondary | ICD-10-CM | POA: Diagnosis not present

## 2020-10-15 DIAGNOSIS — I890 Lymphedema, not elsewhere classified: Secondary | ICD-10-CM | POA: Diagnosis not present

## 2020-10-19 ENCOUNTER — Other Ambulatory Visit: Payer: Self-pay | Admitting: Primary Care

## 2020-10-19 DIAGNOSIS — K219 Gastro-esophageal reflux disease without esophagitis: Secondary | ICD-10-CM

## 2020-11-14 DIAGNOSIS — I890 Lymphedema, not elsewhere classified: Secondary | ICD-10-CM | POA: Diagnosis not present

## 2020-12-09 ENCOUNTER — Telehealth: Payer: Self-pay | Admitting: Primary Care

## 2020-12-09 NOTE — Telephone Encounter (Signed)
Pt left v/m requesting cb; no other info given; I left v/m requesting pt to cb.

## 2020-12-10 NOTE — Telephone Encounter (Signed)
Noted, agree with advice given 

## 2020-12-10 NOTE — Telephone Encounter (Signed)
FYI   Patient called back states that she has had frequency and discomfort with urination for last 2-3 days. She states no fever, abdominal pain, flank pain, or nausea or vomiting. She is out of town and we have made appointment to be seen when she comes back next week. Reviewed all red words with her and if any of them she will go to walk in or ED where she is at.

## 2020-12-10 NOTE — Telephone Encounter (Signed)
I left v/m requesting cb. Sending note to [facility] LPN and Joellen CMA.

## 2020-12-18 ENCOUNTER — Other Ambulatory Visit: Payer: Self-pay

## 2020-12-19 ENCOUNTER — Encounter: Payer: Self-pay | Admitting: Primary Care

## 2020-12-19 ENCOUNTER — Ambulatory Visit (INDEPENDENT_AMBULATORY_CARE_PROVIDER_SITE_OTHER): Payer: Commercial Managed Care - HMO | Admitting: Primary Care

## 2020-12-19 VITALS — BP 138/90 | HR 89 | Temp 96.9°F | Wt 255.1 lb

## 2020-12-19 DIAGNOSIS — R350 Frequency of micturition: Secondary | ICD-10-CM | POA: Diagnosis not present

## 2020-12-19 DIAGNOSIS — I10 Essential (primary) hypertension: Secondary | ICD-10-CM | POA: Diagnosis not present

## 2020-12-19 LAB — POC URINALSYSI DIPSTICK (AUTOMATED)
Bilirubin, UA: NEGATIVE
Glucose, UA: NEGATIVE
Ketones, UA: NEGATIVE
Nitrite, UA: NEGATIVE
Protein, UA: POSITIVE — AB
Spec Grav, UA: 1.025 (ref 1.010–1.025)
Urobilinogen, UA: 0.2 E.U./dL
pH, UA: 5 (ref 5.0–8.0)

## 2020-12-19 MED ORDER — SULFAMETHOXAZOLE-TRIMETHOPRIM 800-160 MG PO TABS: 1.0000 | ORAL_TABLET | Freq: Two times a day (BID) | ORAL | 0 refills | Status: DC

## 2020-12-19 NOTE — Assessment & Plan Note (Signed)
Above goal in the office today, improved on recheck.  Has been off of amlodipine since it was discontinued in July 2021. Will have her back in the office in 1 month for BP check. Will also have her monitor at home.

## 2020-12-19 NOTE — Progress Notes (Signed)
Subjective:    Patient ID: Ann Barker, female    DOB: 1951-09-25, 70 y.o.   MRN: [id number]  HPI  This visit occurred during the SARS-CoV-2 public health emergency.  Safety protocols were in place, including screening questions prior to the visit, additional usage of staff PPE, and extensive cleaning of exam room while observing appropriate contact time as indicated for disinfecting solutions.   Ms. Valenta is a 70 year old female with a history of hypertension, GERD, microscopic hematuria, bowel obstruction, bilateral renal cysts, anemia, prediabetes, chronic fatigue who presents today with a chief complaint of urinary frequency.  She also reports urinary incontinence, dysuria, suprapubic pressure. She denies hematuria, fevers. Symptoms began about 9 days ago. She's felt better over the last several days.   Typically she drinks about three to four bottles of water daily. She has a history of UTI, last one in August 2021 and was treated with Cephalexin..   BP Readings from Last 3 Encounters:  12/19/20 138/90  07/23/20 (!) 160/83  07/09/20 126/80   She does not check her blood pressure at home. Denies headaches, dizziness, chest pain. Previously managed on amlodipine 10 mg which was discontinued during during her hospital visit in July 2021. She has not taken since.  She didn't care for amlodipine as it caused her hair to thin.  Review of Systems  Constitutional: Negative for fever.  Genitourinary: Positive for frequency. Negative for dysuria, hematuria, vaginal bleeding and vaginal discharge.       Past Medical History:  Diagnosis Date  . Acid reflux   . Acute pain of right foot 10/08/2019  . Anemia   . Arthritis   . Cancer ([place]) 1993   SKIN CANCER  . Complication of anesthesia    HARD TO WAKE UP  . Concussion 01/2017  . Dizziness   . Family history of adverse reaction to anesthesia    PT UNSURE OF FAMILY HISTORY  . Gallstones   . Hematuria    microscopic  . History  of hiatal hernia   . History of kidney stones    H/O  . HLD (hyperlipidemia)   . Hoarseness   . Hypertension   . Lower extremity edema    pt states this is chronic and has been told she has lymph edema-wears compression stockings  . Lower leg edema   . Over weight   . PONV (postoperative nausea and vomiting)   . Renal cyst   . Seizures (HCC)    AS A CHILD AND THEN WITH 1 PREGNANCY BUT NONE SINCE  . Sleep apnea    NO CPAP  . Symptomatic cholelithiasis 04/14/2018  . Urge incontinence   . UTI (lower urinary tract infection)   . Vitamin D deficiency      Social History   Socioeconomic History  . Marital status: Divorced    Spouse name: Not on file  . Number of children: Not on file  . Years of education: Not on file  . Highest education level: Not on file  Occupational History  . Not on file  Tobacco Use  . Smoking status: Never Smoker  . Smokeless tobacco: Never Used  Vaping Use  . Vaping Use: Never used  Substance and Sexual Activity  . Alcohol use: No    Alcohol/week: 0.0 standard drinks  . Drug use: No  . Sexual activity: Not on file  Other Topics Concern  . Not on file  Social History Narrative  . Not on file  Social Determinants of Health   Financial Resource Strain: Low Risk   . Difficulty of Paying Living Expenses: Not hard at all  Food Insecurity: No Food Insecurity  . Worried About Charity fundraiser in the Last Year: Never true  . Ran Out of Food in the Last Year: Never true  Transportation Needs: No Transportation Needs  . Lack of Transportation (Medical): No  . Lack of Transportation (Non-Medical): No  Physical Activity: Inactive  . Days of Exercise per Week: 0 days  . Minutes of Exercise per Session: 0 min  Stress: Stress Concern Present  . Feeling of Stress : To some extent  Social Connections: Not on file  Intimate Partner Violence: Not At Risk  . Fear of Current or Ex-Partner: No  . Emotionally Abused: No  . Physically Abused: No  .  Sexually Abused: No    Past Surgical History:  Procedure Laterality Date  . ABDOMINAL HYSTERECTOMY  1981  . BREAST BIOPSY  1995/2005  . CHOLECYSTECTOMY N/A 05/10/2018   Procedure: LAPAROSCOPIC CHOLECYSTECTOMY;  Surgeon: Vickie Epley, MD;  Location: [facility] ORS;  Service: General;  Laterality: N/A;  . COLONOSCOPY WITH PROPOFOL N/A 02/20/2019   Procedure: COLONOSCOPY WITH PROPOFOL;  Surgeon: Lucilla Lame, MD;  Location: [facility] ENDOSCOPY;  Service: Endoscopy;  Laterality: N/A;  . HERNIA REPAIR  2005  . LAPAROSCOPIC LYSIS OF ADHESIONS N/A 05/10/2018   Procedure: LAPAROSCOPIC LYSIS OF ADHESIONS;  Surgeon: Vickie Epley, MD;  Location: [facility] ORS;  Service: General;  Laterality: N/A;  . TUBAL LIGATION  1975    Family History  Problem Relation Age of Onset  . Cancer Paternal Aunt   . Stroke Paternal Uncle   . Prostate cancer Neg Hx   . Kidney disease Neg Hx   . Bladder Cancer Neg Hx     Allergies  Allergen Reactions  . Morphine Other (See Comments)    Unknown  . Clindamycin/Lincomycin Diarrhea  . Methylprednisolone Swelling  . Codeine Palpitations and Other (See Comments)    unknown  . Morphine And Related Palpitations  . Penicillins Rash and Other (See Comments)    unknown    Current Outpatient Medications on File Prior to Visit  Medication Sig Dispense Refill  . cholecalciferol (VITAMIN D) 1000 units tablet Take 1,000 Units by mouth daily.    . folic acid (FOLVITE) 500 MCG tablet Take 800 mcg by mouth daily.    . Multiple Vitamin (MULTIVITAMIN) capsule Take 1 capsule by mouth daily.    Marland Kitchen omeprazole (PRILOSEC) 20 MG capsule TAKE 1 CAPSULE BY MOUTH EVERY DAY FOR HEARTBURN 90 capsule 0   No current facility-administered medications on file prior to visit.    BP (!) 148/92   Pulse 89   Temp (!) 96.9 F (36.1 C)   Wt 255 lb 1.9 oz (115.7 kg)   SpO2 98%   BMI 43.79 kg/m    Objective:   Physical Exam Pulmonary:     Effort: Pulmonary effort is normal.  Abdominal:      Tenderness: There is no abdominal tenderness. There is no right CVA tenderness or left CVA tenderness.  Neurological:     Mental Status: She is alert.            Assessment & Plan:

## 2020-12-19 NOTE — Assessment & Plan Note (Signed)
Acute for one week, now with nearly resolved symptoms.  History of acute cystitis.  UA today with 2+ leuks, negative nitrites, 3+ blood. Culture sent.  Given UTI history, will treat for presumed infection.  Rx for bactrim DS course sent to pharmacy.  Await urine culture results.

## 2020-12-19 NOTE — Patient Instructions (Addendum)
Ensure you are consuming 64 ounces of water daily.  Start Bactrim DS (sulfamethoxazole/trimethoprim) tablets for urinary tract infection. Take 1 tablet by mouth twice daily for 3 days.  Start monitoring your blood pressure daily, around the same time of day.  Ensure that you have rested for 30 minutes prior to checking your blood pressure. Record your readings and bring them to your next visit.  Please schedule a follow up appointment in 1 month for blood pressure check.  It was a pleasure to see you today!

## 2020-12-20 LAB — URINE CULTURE
MICRO NUMBER:: 11394470
SPECIMEN QUALITY:: ADEQUATE

## 2020-12-23 ENCOUNTER — Telehealth: Payer: Self-pay

## 2020-12-23 DIAGNOSIS — R3129 Other microscopic hematuria: Secondary | ICD-10-CM

## 2020-12-23 DIAGNOSIS — R5382 Chronic fatigue, unspecified: Secondary | ICD-10-CM | POA: Diagnosis not present

## 2020-12-23 DIAGNOSIS — R110 Nausea: Secondary | ICD-10-CM | POA: Diagnosis not present

## 2020-12-23 DIAGNOSIS — N281 Cyst of kidney, acquired: Secondary | ICD-10-CM

## 2020-12-23 DIAGNOSIS — R42 Dizziness and giddiness: Secondary | ICD-10-CM | POA: Diagnosis not present

## 2020-12-23 NOTE — Telephone Encounter (Signed)
Called and spoke with patient regarding her urine results (see result note). Patient stated that she was not seeing a nephrologist at this moment, but would like to see a different doctor than she has seen in the past. Patient reported that when she was in the hospital last year, she was told she had a kidney stone, but this issue was not addressed. Patient then stated, "If I didn't have an infection, why did she put me on antibiotics?" Patient reported that she has been having N/V, dizziness, weakness, SOB, and chest pain since Saturday after starting the antibiotics on Friday. Patient reports that she looked at the bottle and realized that it was a Sulfa antibiotic. Patient stated, "I can't handle anything with Sulfa in it." Allergy list reviewed. Sulfa antibiotics not listed as an allergy. Patient stated that she took all the antibiotics. This RN informed patient that if she is having SOB and chest pain along with her other symptoms, she needed to go to the ED as soon as possible or call 911. 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 Educated patient that these symptoms were serious and could be life threatening and she needed to be seen. Offered to call 911 for patient, but she denied. Patient agreed to be seen at ED and stated that she would get up and have someone take her. Allie Bossier, NP made aware of this situation and agreed with ED recommendations.

## 2020-12-23 NOTE — Telephone Encounter (Signed)
We do not have sulfa listed on her allergy list, also if she had a allergy to sulfa then the pharmacy would have caught it as well.  I have added sulfa to her allergy list.  Can we check on patient on 12/24/2020?

## 2020-12-24 NOTE — Telephone Encounter (Signed)
Please notify patient that her sulfa allergy was not listed on her chart, also, she must not have the allergy listed at her pharmacy or they would have refrain from filling the prescription.  She needs to ensure that her sulfa allergy is listed with her pharmacy.  Also, we treated her empirically for UTI given her urine sample results and her symptoms.  We do this sometimes for people who show high suspicion for UTI.  She saw urology in 2016 for blood in the urine, she was late to that appointment so they cannot discuss much in depth.  Urology recommended that she see a nephrologist given renal cysts, urology referred her to nephrology but I do not see where she went.  See notes from [place]ara Council, [place].  I referred her to nephrology again in April 2021, patient admitted that she never followed through with referral in 2016.  She saw nephrology in June 2021 for her hematuria, renal cyst, and urinary incontinence who recommended repeat renal ultrasound, I do not see where this was completed.  Did she get her repeat renal ultrasound?  Her kidney doctor would like to see her back in June of this year for follow-up.  Why did the urgent care doctor recommend she see urology?

## 2020-12-24 NOTE — Telephone Encounter (Signed)
Called and spoke with patient to follow-up from yesterday. Patient stated that she is feeling better and that she did call 911 yesterday to be evaluated by EMS. However, she decided to go to UC instead of ED D/T the long wait time. Patient stated that the doctor at the urgent care did blood work and an EKG, which was normal. Patient stated that she is currently waiting on the blood work to come back. According to patient, the doctor at [facility] stated that her symptoms may have been a reaction to the sulfa and that she should be referred to a urologist by her PCP. Informed patient that I would make Allie Bossier, NP aware. Advised patient that if she has any new or worsening symptoms, she should call office back. Patient verbalized understanding and stated that she was feeling much better.

## 2020-12-25 NOTE — Telephone Encounter (Signed)
Left message to return call to our office.  

## 2020-12-26 NOTE — Telephone Encounter (Signed)
Returning phone call °

## 2021-01-05 NOTE — Telephone Encounter (Signed)
Called and left voicemail for patient to return call to office.  °

## 2021-01-05 NOTE — Telephone Encounter (Signed)
Please thank her for the information, I recommend that we obtain a renal ultrasound to evaluate for kidney stones and cysts. I will order if she's willing. [facility] [employer]? She will receive a call regarding this, make sure she knows to answer her phone.

## 2021-01-05 NOTE — Telephone Encounter (Signed)
Called and spoke with patient who stated that the UC doctor stated that she should see a urologist because he thought she might have kidney stones. Also, patient stated that she never had the renal ultrasound done because no one said that she needed one done.

## 2021-01-07 NOTE — Telephone Encounter (Signed)
Called patient would like to have done in [place] she does not want to go to office where she is seen in [place].

## 2021-01-07 NOTE — Addendum Note (Signed)
Addended by: Pleas Koch on: 01/07/2021 01:18 PM   Modules accepted: Orders

## 2021-01-07 NOTE — Telephone Encounter (Signed)
Noted, orders placed. 

## 2021-01-15 ENCOUNTER — Ambulatory Visit
Admission: RE | Admit: 2021-01-15 | Discharge: 2021-01-15 | Disposition: A | Discharge status: Home / Self Care | Payer: Medicare HMO | Source: Ambulatory Visit | Attending: Primary Care | Admitting: Primary Care

## 2021-01-15 ENCOUNTER — Telehealth: Payer: Self-pay

## 2021-01-15 ENCOUNTER — Other Ambulatory Visit: Payer: Commercial Managed Care - HMO

## 2021-01-15 DIAGNOSIS — Z87442 Personal history of urinary calculi: Secondary | ICD-10-CM | POA: Diagnosis not present

## 2021-01-15 DIAGNOSIS — R3129 Other microscopic hematuria: Secondary | ICD-10-CM

## 2021-01-15 DIAGNOSIS — N281 Cyst of kidney, acquired: Secondary | ICD-10-CM

## 2021-01-15 NOTE — Telephone Encounter (Signed)
I spoke with pt; the red and draining area of pus is on lt groin area. Pt said started with reddened area last wk and started draining on 01/12/21. Area has very bad foul smell and pain level now is 6 -7. No fever. Pt has h/a also. Pt is going to UC on [address] in [place] when can get transportation; will try for today. Sending note to Gentry Fitz NP.

## 2021-01-15 NOTE — Telephone Encounter (Signed)
[facility] - Client TELEPHONE ADVICE RECORD [facility] Patient Name: MIRRANDA MONRROY Gender: Female DOB: 06/30/1951 Age: 70 Y 10 M 27 D Return Phone Number: [phone] (Primary) Address: City/State/Zip: [place] [address] Client [facility] - Client Client Site [facility] - Day Physician [name] - NP Contact Type Call Who Is Calling Patient / Member / Family / Caregiver Call Type Triage / Clinical Relationship To Patient Self Return Phone Number [phone] (Primary) Chief Complaint Vaginal Pain Reason for Call Symptomatic / Request for [place] states she has an infection that is red, hot and draining in her vaginal area. Translation No Nurse Assessment Nurse: Earley Favor, RN, Suanne Marker Date/Time (Eastern Time): 01/15/2021 4:19:32 PM Confirm and document reason for call. If symptomatic, describe symptoms. ---Caller states she is having some foul smelling drainage from a surgical site lower abdomen (from years ago). Site is very painful. Area is very inflamed. Afebrile. Does the patient have any new or worsening symptoms? ---Yes Will a triage be completed? ---Yes Related visit to physician within the last 2 weeks? ---No Does the PT have any chronic conditions? (i.e. diabetes, asthma, this includes High risk factors for pregnancy, etc.) ---Yes Is this a behavioral health or substance abuse call? ---No Guidelines Guideline Title Affirmed Question Affirmed Notes Nurse Date/Time (Eastern Time) Wound Infection [1] Pus or cloudy fluid draining from wound AND [2] no fever Earley Favor, RN, Suanne Marker 01/15/2021 4:28:50 PM Disp. Time Eilene Ghazi Time) Disposition Final User 01/15/2021 4:35:57 PM See PCP within 24 Hours Yes Earley Favor, RN, Rosalyn Charters Disagree/Comply Comply Caller Understands Yes PreDisposition Did not know what to do PLEASE NOTE: All timestamps contained within this  report are represented as [place] Standard Time. CONFIDENTIALTY NOTICE: This fax transmission is intended only for the addressee. It contains information that is legally privileged, confidential or otherwise protected from use or disclosure. If you are not the intended recipient, you are strictly prohibited from reviewing, disclosing, copying using or disseminating any of this information or taking any action in reliance on or regarding this information. If you have received this fax in error, please notify us immediately by telephone so that we can arrange for its return to [place]. Phone: [phone], Toll-Free: [phone], Fax: [phone] Page: 2 of 2 Call Id: [id number] Care Advice Given Per Guideline SEE PCP WITHIN 24 HOURS: * IF OFFICE WILL BE OPEN: You need to be examined within the next 24 hours. Call your doctor (or NP/PA) when the office opens and make an appointment. * Fever occurs * You become worse CALL BACK IF: ANTIBIOTIC OINTMENT: * Apply an over-the-counter antibiotic ointment (e.g., Bacitracin) 3 times a day. Referrals REFERRED TO PCP OFFICE

## 2021-01-16 NOTE — Telephone Encounter (Signed)
Noted and agree that she needs to be evaluated.  Can we call to check on her Monday next week?

## 2021-01-19 NOTE — Telephone Encounter (Signed)
Called to schedule pt a visit. Unable to make appt and lvm to call back as vm full.

## 2021-01-19 NOTE — Telephone Encounter (Signed)
Can you call to make app?  

## 2021-01-19 NOTE — Telephone Encounter (Signed)
Noted, will evaluate. 

## 2021-01-19 NOTE — Telephone Encounter (Signed)
Patient called to schedule visit. Pt stated she did not go to UC. Patient said she was told that she would need to see PCP and be referred to urologist. Patient scheduled for 2/8 at 11:20 due to issues. Routing to PCP as Juluis Rainier

## 2021-01-20 ENCOUNTER — Ambulatory Visit: Payer: Medicare HMO | Admitting: Primary Care

## 2021-01-21 ENCOUNTER — Ambulatory Visit (INDEPENDENT_AMBULATORY_CARE_PROVIDER_SITE_OTHER): Payer: Medicare HMO | Admitting: Primary Care

## 2021-01-21 ENCOUNTER — Other Ambulatory Visit: Payer: Self-pay

## 2021-01-21 ENCOUNTER — Encounter: Payer: Self-pay | Admitting: Primary Care

## 2021-01-21 VITALS — BP 144/62 | HR 71 | Temp 96.5°F | Ht 64.0 in | Wt 255.0 lb

## 2021-01-21 DIAGNOSIS — E2839 Other primary ovarian failure: Secondary | ICD-10-CM | POA: Diagnosis not present

## 2021-01-21 DIAGNOSIS — Z1231 Encounter for screening mammogram for malignant neoplasm of breast: Secondary | ICD-10-CM

## 2021-01-21 DIAGNOSIS — R3129 Other microscopic hematuria: Secondary | ICD-10-CM | POA: Diagnosis not present

## 2021-01-21 DIAGNOSIS — R238 Other skin changes: Secondary | ICD-10-CM

## 2021-01-21 DIAGNOSIS — I10 Essential (primary) hypertension: Secondary | ICD-10-CM | POA: Diagnosis not present

## 2021-01-21 DIAGNOSIS — N281 Cyst of kidney, acquired: Secondary | ICD-10-CM | POA: Diagnosis not present

## 2021-01-21 NOTE — Progress Notes (Signed)
Subjective:    Patient ID: Ann Barker, female    DOB: 1951/01/16, 70 y.o.   MRN: [id number]  HPI  This visit occurred during the SARS-CoV-2 public health emergency.  Safety protocols were in place, including screening questions prior to the visit, additional usage of staff PPE, and extensive cleaning of exam room while observing appropriate contact time as indicated for disinfecting solutions.   Ann Barker is a 70 year old female with a history of renal cysts, hypertension, chronic venous insufficiency, microscopic hematuria, anemia, prediabetes who presents today with a chief complaint of skin irritation. She is also due for her bone density scan and mammogram. She would also like a referral to Urology.    Her irritation is located under the left abdominal fold for which has been intermittent for years. She will experience flares to the site 1-2 times annually, no recent flare until one week ago.   Her irritation formed a sore which became erythematous and eventually began draining. Last week her irritation was noted during her renal ultrasound, she had noticed blood drainage at the time. She's been applying tissues with peroxide for treatment. Her wound began closing back up a few days ago. She denies fevers. She tries to keep the site under each abdominal fold dry.   She would like to see a Urologist to discuss her renal cysts. She would like the renal cysts "taken care of for good". She underwent renal ultrasound last week which showed numerous small cysts bilaterally, too small to characterize. MRI was recommend for further evaluation. CT scan from August 2021 was without renal stones. She does follow with nephrology, would like to see Urology.   BP Readings from Last 3 Encounters:  01/21/21 (!) 144/62  12/19/20 138/90  07/23/20 (!) 160/83     Review of Systems  Genitourinary: Negative for dysuria, frequency, hematuria and vaginal discharge.  Skin: Negative for color change and  wound.       See HPI       Past Medical History:  Diagnosis Date  . Acid reflux   . Acute pain of right foot 10/08/2019  . Anemia   . Arthritis   . Cancer ([place]) 1993   SKIN CANCER  . Complication of anesthesia    HARD TO WAKE UP  . Concussion 01/2017  . Dizziness   . Family history of adverse reaction to anesthesia    PT UNSURE OF FAMILY HISTORY  . Gallstones   . Hematuria    microscopic  . History of hiatal hernia   . History of kidney stones    H/O  . HLD (hyperlipidemia)   . Hoarseness   . Hypertension   . Lower extremity edema    pt states this is chronic and has been told she has lymph edema-wears compression stockings  . Lower leg edema   . Over weight   . PONV (postoperative nausea and vomiting)   . Renal cyst   . Seizures (HCC)    AS A CHILD AND THEN WITH 1 PREGNANCY BUT NONE SINCE  . Sleep apnea    NO CPAP  . Symptomatic cholelithiasis 04/14/2018  . Urge incontinence   . UTI (lower urinary tract infection)   . Vitamin D deficiency      Social History   Socioeconomic History  . Marital status: Divorced    Spouse name: Not on file  . Number of children: Not on file  . Years of education: Not on file  . Highest  education level: Not on file  Occupational History  . Not on file  Tobacco Use  . Smoking status: Never Smoker  . Smokeless tobacco: Never Used  Vaping Use  . Vaping Use: Never used  Substance and Sexual Activity  . Alcohol use: No    Alcohol/week: 0.0 standard drinks  . Drug use: No  . Sexual activity: Not on file  Other Topics Concern  . Not on file  Social History Narrative  . Not on file   Social Determinants of Health   Financial Resource Strain: Low Risk   . Difficulty of Paying Living Expenses: Not hard at all  Food Insecurity: No Food Insecurity  . Worried About Charity fundraiser in the Last Year: Never true  . Ran Out of Food in the Last Year: Never true  Transportation Needs: No Transportation Needs  . Lack of  Transportation (Medical): No  . Lack of Transportation (Non-Medical): No  Physical Activity: Inactive  . Days of Exercise per Week: 0 days  . Minutes of Exercise per Session: 0 min  Stress: Stress Concern Present  . Feeling of Stress : To some extent  Social Connections: Not on file  Intimate Partner Violence: Not At Risk  . Fear of Current or Ex-Partner: No  . Emotionally Abused: No  . Physically Abused: No  . Sexually Abused: No    Past Surgical History:  Procedure Laterality Date  . ABDOMINAL HYSTERECTOMY  1981  . BREAST BIOPSY  1995/2005  . CHOLECYSTECTOMY N/A 05/10/2018   Procedure: LAPAROSCOPIC CHOLECYSTECTOMY;  Surgeon: Vickie Epley, MD;  Location: [facility] ORS;  Service: General;  Laterality: N/A;  . COLONOSCOPY WITH PROPOFOL N/A 02/20/2019   Procedure: COLONOSCOPY WITH PROPOFOL;  Surgeon: Lucilla Lame, MD;  Location: [facility] ENDOSCOPY;  Service: Endoscopy;  Laterality: N/A;  . HERNIA REPAIR  2005  . LAPAROSCOPIC LYSIS OF ADHESIONS N/A 05/10/2018   Procedure: LAPAROSCOPIC LYSIS OF ADHESIONS;  Surgeon: Vickie Epley, MD;  Location: [facility] ORS;  Service: General;  Laterality: N/A;  . TUBAL LIGATION  1975    Family History  Problem Relation Age of Onset  . Cancer Paternal Aunt   . Stroke Paternal Uncle   . Prostate cancer Neg Hx   . Kidney disease Neg Hx   . Bladder Cancer Neg Hx     Allergies  Allergen Reactions  . Morphine Other (See Comments)    Unknown  . Clindamycin/Lincomycin Diarrhea  . Methylprednisolone Swelling  . Sulfa Antibiotics   . Codeine Palpitations and Other (See Comments)    unknown  . Morphine And Related Palpitations  . Penicillins Rash and Other (See Comments)    unknown    Current Outpatient Medications on File Prior to Visit  Medication Sig Dispense Refill  . cholecalciferol (VITAMIN D) 1000 units tablet Take 1,000 Units by mouth daily.    . folic acid (FOLVITE) 308 MCG tablet Take 800 mcg by mouth daily.    . Multiple Vitamin  (MULTIVITAMIN) capsule Take 1 capsule by mouth daily.    Marland Kitchen omeprazole (PRILOSEC) 20 MG capsule TAKE 1 CAPSULE BY MOUTH EVERY DAY FOR HEARTBURN 90 capsule 0   No current facility-administered medications on file prior to visit.    BP (!) 144/62   Pulse 71   Temp (!) 96.5 F (35.8 C) (Temporal)   Ht 5\' 4"  (1.626 m)   Wt 255 lb (115.7 kg)   SpO2 97%   BMI 43.77 kg/m    Objective:   Physical Exam  Constitutional:      Appearance: She is well-nourished.  Cardiovascular:     Rate and Rhythm: Normal rate and regular rhythm.  Pulmonary:     Effort: Pulmonary effort is normal.     Breath sounds: Normal breath sounds.  Musculoskeletal:     Cervical back: Neck supple.  Skin:    General: Skin is warm and dry.     Findings: No erythema or rash.     Comments: No wounds or irritation noted to site of concern. Large abdominal folds present. No foul smell.   Psychiatric:        Mood and Affect: Mood and affect normal.            Assessment & Plan:

## 2021-01-21 NOTE — Assessment & Plan Note (Signed)
Above goal in the office again today, discussed this in great detail. She believes that she doesn't have high blood pressure, "it was fine a urgent care".   We discussed potential effects of untreated hypertension. She declines treatment today. She will start monitoring BP at home and follow up in 1 month. If above goal in 1 month she agrees to discuss treatment.

## 2021-01-21 NOTE — Assessment & Plan Note (Signed)
Noted again on renal ultrasound. Patient declines MRI and would like to see Urology. We discussed that cysts are typically not removed if they are benign.   Referral placed.

## 2021-01-21 NOTE — Patient Instructions (Addendum)
  You will be contacted regarding your referral to Urology.  Please let us know if you have not been contacted within two weeks.   Keep the site clean and dry to prevent irritation and wounds.  It was a pleasure to see you today!   Call for appointment for:   []   2D Mammogram  [x]   3D Mammogram  [x]   Bone Density     Your appointment will at the following location  [x]   [facility]  [place] [address]  [phone]  []   [facility] [facility])   [address]. [place], [place] [address]  [phone]  []   [facility]      [address] [place], [place]         []   [facility]  [place], [place]  []  [facility]   [address], [place] [address]  []  [facility]  [address] [place], [place] [address] [phone]    Make sure to wear two peace clothing  No lotions powders or deodorants the day of the appointment Make sure to bring picture ID and insurance card.  Bring list of medications you are currently taking including any supplements.

## 2021-01-21 NOTE — Assessment & Plan Note (Signed)
No abnormality noted today on exam.  Suspect irritation secondary to abdominal fold friction and moisture. Discussed prevention of this today.   She will monitor.

## 2021-01-21 NOTE — Assessment & Plan Note (Signed)
Follows with nephrology, referral placed to Urology. Noted on most recent UA, culture negative.

## 2021-01-22 ENCOUNTER — Other Ambulatory Visit: Payer: Commercial Managed Care - HMO

## 2021-01-28 ENCOUNTER — Telehealth: Payer: Self-pay | Admitting: *Deleted

## 2021-01-28 NOTE — Telephone Encounter (Signed)
Patient called stating that she was recently in and Allie Bossier NP had talked to her about going back on blood pressure medication. Patient stated that she is concerned because when she was on blood pressure medication previously her hair was coming out. Patient stated since she stopped the blood pressure medication her hair is thickening up. Patient stated that she might be okay going on a low dose of blood pressure medication if it will not affect her hair. Patient stated that she has a hard time tolerating any medications. Patient stated that she had a bad reaction to Sulfa last year. Pharmacy [facility] [address]

## 2021-01-28 NOTE — Telephone Encounter (Signed)
Noted.  Please notify patient that we can try olmesartan 20 mg once daily for high blood pressure.  This is a low dose and should be enough to treat her blood pressure.  If I prescribe, then I will need to see her back in the office in 2 weeks for blood pressure check and labs. If she agrees, then okay to send in olmesartan 20 mg.  Take 1 tablet by mouth once daily for blood pressure.  #30, 0 refills.  BP Readings from Last 3 Encounters:  01/21/21 (!) 144/62  12/19/20 138/90  07/23/20 (!) 160/83

## 2021-01-29 ENCOUNTER — Other Ambulatory Visit: Payer: Self-pay | Admitting: Primary Care

## 2021-01-29 DIAGNOSIS — K219 Gastro-esophageal reflux disease without esophagitis: Secondary | ICD-10-CM

## 2021-01-29 NOTE — Telephone Encounter (Signed)
Left message to return call to our office.  

## 2021-01-30 NOTE — Telephone Encounter (Signed)
Left message to return call to our office.  

## 2021-01-30 NOTE — Telephone Encounter (Signed)
Pt called back returning phone call. 

## 2021-02-02 ENCOUNTER — Other Ambulatory Visit: Payer: Self-pay

## 2021-02-02 MED ORDER — OLMESARTAN MEDOXOMIL 20 MG PO TABS: 20.0000 mg | ORAL_TABLET | Freq: Every day | ORAL | 0 refills | Status: DC

## 2021-02-02 NOTE — Telephone Encounter (Signed)
Spoke to patient. Agreed to medication change. Follow up made for 2 weeks. She will get today and start. Script called in for #30 no rf.

## 2021-02-03 NOTE — Telephone Encounter (Signed)
Noted  

## 2021-02-18 ENCOUNTER — Encounter: Payer: Self-pay | Admitting: Primary Care

## 2021-02-18 ENCOUNTER — Ambulatory Visit (INDEPENDENT_AMBULATORY_CARE_PROVIDER_SITE_OTHER): Payer: Medicare HMO | Admitting: Primary Care

## 2021-02-18 ENCOUNTER — Other Ambulatory Visit: Payer: Self-pay

## 2021-02-18 VITALS — BP 122/82 | HR 85 | Temp 97.5°F | Ht 64.0 in | Wt 256.0 lb

## 2021-02-18 DIAGNOSIS — E65 Localized adiposity: Secondary | ICD-10-CM | POA: Diagnosis not present

## 2021-02-18 DIAGNOSIS — I10 Essential (primary) hypertension: Secondary | ICD-10-CM | POA: Diagnosis not present

## 2021-02-18 NOTE — Assessment & Plan Note (Signed)
Improved and at goal on olmesartan 20 mg. Continue same.  BMP pending

## 2021-02-18 NOTE — Progress Notes (Signed)
Subjective:    Patient ID: Ann Barker, female    DOB: Jul 28, 1951, 70 y.o.   MRN: [id number]  HPI  Ann Barker is a very pleasant 70 y.o. female with a history of hypertension, chronic venous insufficiency, microscopic hematuria, anemia, urinary frequency, who presents today for follow-up of hypertension.  She was last evaluated on 01/21/2021 blood pressure was noted to be above goal, she denied a history of high blood pressure.  We discussed the potential harmful effects of uncontrolled hypertension, so I asked her to monitor her blood pressure and return to [place] 1 month later for follow-up.   She called about one week later agreeing to resume antihypertension medication so we sent in olmesartan 20 mg.   Today she admits to infrequently checking her BP at home for which is running "120's", doesn't recall the diastolic readings. She's feeling much better overall since her last visit, is not having headaches and her dizziness has resolved.   She is very concerned about her large, chronic lower abdominal fold. This causes a lot of sweating and moisture causing skin breakdown. She would be interested in seeing a surgeon to discuss removal.   BP Readings from Last 3 Encounters:  02/18/21 122/82  01/21/21 (!) 144/62  12/19/20 138/90     Review of Systems  Respiratory: Negative for shortness of breath.   Cardiovascular: Negative for chest pain.  Neurological: Negative for dizziness and headaches.         Past Medical History:  Diagnosis Date  . Acid reflux   . Acute pain of right foot 10/08/2019  . Anemia   . Arthritis   . Cancer ([place]) 1993   SKIN CANCER  . Complication of anesthesia    HARD TO WAKE UP  . Concussion 01/2017  . Dizziness   . Family history of adverse reaction to anesthesia    PT UNSURE OF FAMILY HISTORY  . Gallstones   . Hematuria    microscopic  . History of hiatal hernia   . History of kidney stones    H/O  . HLD (hyperlipidemia)   . Hoarseness    . Hypertension   . Lower extremity edema    pt states this is chronic and has been told she has lymph edema-wears compression stockings  . Lower leg edema   . Over weight   . PONV (postoperative nausea and vomiting)   . Renal cyst   . Seizures (HCC)    AS A CHILD AND THEN WITH 1 PREGNANCY BUT NONE SINCE  . Sleep apnea    NO CPAP  . Symptomatic cholelithiasis 04/14/2018  . Urge incontinence   . UTI (lower urinary tract infection)   . Vitamin D deficiency     Social History   Socioeconomic History  . Marital status: Divorced    Spouse name: Not on file  . Number of children: Not on file  . Years of education: Not on file  . Highest education level: Not on file  Occupational History  . Not on file  Tobacco Use  . Smoking status: Never Smoker  . Smokeless tobacco: Never Used  Vaping Use  . Vaping Use: Never used  Substance and Sexual Activity  . Alcohol use: No    Alcohol/week: 0.0 standard drinks  . Drug use: No  . Sexual activity: Not on file  Other Topics Concern  . Not on file  Social History Narrative  . Not on file   Social Determinants of Health  Financial Resource Strain: Low Risk   . Difficulty of Paying Living Expenses: Not hard at all  Food Insecurity: No Food Insecurity  . Worried About Charity fundraiser in the Last Year: Never true  . Ran Out of Food in the Last Year: Never true  Transportation Needs: No Transportation Needs  . Lack of Transportation (Medical): No  . Lack of Transportation (Non-Medical): No  Physical Activity: Inactive  . Days of Exercise per Week: 0 days  . Minutes of Exercise per Session: 0 min  Stress: Stress Concern Present  . Feeling of Stress : To some extent  Social Connections: Not on file  Intimate Partner Violence: Not At Risk  . Fear of Current or Ex-Partner: No  . Emotionally Abused: No  . Physically Abused: No  . Sexually Abused: No    Past Surgical History:  Procedure Laterality Date  . ABDOMINAL  HYSTERECTOMY  1981  . BREAST BIOPSY  1995/2005  . CHOLECYSTECTOMY N/A 05/10/2018   Procedure: LAPAROSCOPIC CHOLECYSTECTOMY;  Surgeon: Vickie Epley, MD;  Location: [facility] OR[facility];  Service: General;  Laterality: N/A;  . COLONOSCOPY WITH PROPOFOL N/A 02/20/2019   Procedure: COLONOSCOPY WITH PROPOFOL;  Surgeon: Lucilla Lame, MD;  Location: [facility] END[facility];  Service: Endoscopy;  Laterality: N/A;  . HERNIA REPAIR  2005  . LAPAROSCOPIC LYSIS OF ADHESIONS N/A 05/10/2018   Procedure: LAPAROSCOPIC LYSIS OF ADHESIONS;  Surgeon: Vickie Epley, MD;  Location: [facility] ORS;  Service: General;  Laterality: N/A;  . TUBAL LIGATION  1975    Family History  Problem Relation Age of Onset  . Cancer Paternal Aunt   . Stroke Paternal Uncle   . Prostate cancer Neg Hx   . Kidney disease Neg Hx   . Bladder Cancer Neg Hx     Allergies  Allergen Reactions  . Morphine Other (See Comments)    Unknown  . Clindamycin/Lincomycin Diarrhea  . Methylprednisolone Swelling  . Sulfa Antibiotics   . Codeine Palpitations and Other (See Comments)    unknown  . Morphine And Related Palpitations  . Penicillins Rash and Other (See Comments)    unknown    Current Outpatient Medications on File Prior to Visit  Medication Sig Dispense Refill  . cholecalciferol (VITAMIN D) 1000 units tablet Take 1,000 Units by mouth daily.    . folic acid (FOLVITE) 188 MCG tablet Take 800 mcg by mouth daily.    . Multiple Vitamin (MULTIVITAMIN) capsule Take 1 capsule by mouth daily.    Marland Kitchen olmesartan (BENICAR) 20 MG tablet Take 1 tablet (20 mg total) by mouth daily. 30 tablet 0  . omeprazole (PRILOSEC) 20 MG capsule TAKE 1 CAPSULE BY MOUTH EVERY DAY FOR HEARTBURN 90 capsule 0   No current facility-administered medications on file prior to visit.    BP 122/82 (BP Location: Left Arm, Patient Position: Sitting, Cuff Size: Large)   Pulse 85   Temp (!) 97.5 F (36.4 C) (Temporal)   Ht 5\' 4"  (1.626 m)   Wt 256 lb (116.1 kg)   SpO2 98%   BMI  43.94 kg/m  Objective:   Physical Exam Constitutional:      Appearance: She is well-nourished.  Cardiovascular:     Rate and Rhythm: Normal rate and regular rhythm.  Pulmonary:     Effort: Pulmonary effort is normal.     Breath sounds: Normal breath sounds.  Musculoskeletal:     Cervical back: Neck supple.  Skin:    General: Skin is warm and dry.  Psychiatric:        Mood and Affect: Mood and affect normal.           Assessment & Plan:      This visit occurred during the SARS-CoV-2 public health emergency.  Safety protocols were in place, including screening questions prior to the visit, additional usage of staff PPE, and extensive cleaning of exam room while observing appropriate contact time as indicated for disinfecting solutions.

## 2021-02-18 NOTE — Assessment & Plan Note (Signed)
Large, noted today. She will notify when she is ready for a general surgery consult to have this evaluated.

## 2021-02-18 NOTE — Patient Instructions (Signed)
Stop by the lab prior to leaving today. I will notify you of your results once received.   Continue taking olmesartan 20 mg once daily for blood pressure.  It was a pleasure to see you today!

## 2021-02-19 LAB — BASIC METABOLIC PANEL
BUN: 21 mg/dL (ref 6–23)
CO2: 28 mEq/L (ref 19–32)
Calcium: 8.9 mg/dL (ref 8.4–10.5)
Chloride: 107 mEq/L (ref 96–112)
Creatinine, Ser: 0.94 mg/dL (ref 0.40–1.20)
GFR: 61.01 mL/min (ref >60.00)
Glucose, Bld: 84 mg/dL (ref 70–99)
Potassium: 3.8 mEq/L (ref 3.5–5.1)
Sodium: 141 mEq/L (ref 135–145)

## 2021-02-19 NOTE — Progress Notes (Signed)
Called patient reviewed all information and repeated back to me. Will call if any questions.  ? ?

## 2021-02-24 ENCOUNTER — Other Ambulatory Visit: Payer: Self-pay | Admitting: Primary Care

## 2021-02-24 DIAGNOSIS — I10 Essential (primary) hypertension: Secondary | ICD-10-CM

## 2021-02-25 DIAGNOSIS — N281 Cyst of kidney, acquired: Secondary | ICD-10-CM | POA: Diagnosis not present

## 2021-02-25 DIAGNOSIS — N3281 Overactive bladder: Secondary | ICD-10-CM | POA: Diagnosis not present

## 2021-02-25 DIAGNOSIS — R3121 Asymptomatic microscopic hematuria: Secondary | ICD-10-CM | POA: Diagnosis not present

## 2021-02-26 ENCOUNTER — Telehealth: Payer: Self-pay

## 2021-02-26 DIAGNOSIS — E65 Localized adiposity: Secondary | ICD-10-CM

## 2021-02-26 NOTE — Telephone Encounter (Signed)
Pt was seen 02/18/21 by Gentry Fitz NP; pt saw urologist on 02/25/21 and urologist told pt everything appears OK and urologist is very pleased and pt is to go back in 3 mths to see NP or PA. Pt would like to proceed with surgical referral for abd pannus. Pt does not care whether she goes to [facility] or [facility]. Pt request cb after reviewed by Gentry Fitz NP.

## 2021-02-27 NOTE — Telephone Encounter (Signed)
Noted, referral to plastic surgery placed.

## 2021-04-22 ENCOUNTER — Ambulatory Visit (INDEPENDENT_AMBULATORY_CARE_PROVIDER_SITE_OTHER): Payer: Medicare HMO | Admitting: Plastic Surgery

## 2021-04-22 ENCOUNTER — Other Ambulatory Visit: Payer: Self-pay

## 2021-04-22 ENCOUNTER — Encounter: Payer: Self-pay | Admitting: Plastic Surgery

## 2021-04-22 VITALS — BP 101/65 | HR 88 | Ht 64.0 in | Wt 261.0 lb

## 2021-04-22 DIAGNOSIS — M793 Panniculitis, unspecified: Secondary | ICD-10-CM | POA: Diagnosis not present

## 2021-04-22 NOTE — Progress Notes (Signed)
Referring Provider Pleas Koch, NP [place],  [place] [address]   CC:  Chief Complaint  Patient presents with  . Advice Only      Ann Barker is an 70 y.o. female.  HPI: Patient presents to discuss her abdomen.  She is bothered by the overhanging skin.  She gets rashes beneath the crease that been refractory to over-the-counter treatments.  She also gets back pain.  The surgical history of the abdomen includes a hysterectomy and tubal ligation along with a hernia repair.  She does not smoke and is not a diabetic.  Allergies  Allergen Reactions  . Morphine Other (See Comments)    Unknown  . Clindamycin/Lincomycin Diarrhea  . Methylprednisolone Swelling  . Sulfa Antibiotics   . Codeine Palpitations and Other (See Comments)    unknown  . Morphine And Related Palpitations  . Penicillins Rash and Other (See Comments)    unknown    Outpatient Encounter Medications as of 04/22/2021  Medication Sig  . cholecalciferol (VITAMIN D) 1000 units tablet Take 1,000 Units by mouth daily.  . folic acid (FOLVITE) 350 MCG tablet Take 800 mcg by mouth daily.  . Multiple Vitamin (MULTIVITAMIN) capsule Take 1 capsule by mouth daily.  Marland Kitchen olmesartan (BENICAR) 20 MG tablet Take 1 tablet (20 mg total) by mouth daily. For blood pressure.  Marland Kitchen omeprazole (PRILOSEC) 20 MG capsule TAKE 1 CAPSULE BY MOUTH EVERY DAY FOR HEARTBURN   No facility-administered encounter medications on file as of 04/22/2021.     Past Medical History:  Diagnosis Date  . Acid reflux   . Acute pain of right foot 10/08/2019  . Anemia   . Arthritis   . Cancer ([place]) 1993   SKIN CANCER  . Complication of anesthesia    HARD TO WAKE UP  . Concussion 01/2017  . Dizziness   . Family history of adverse reaction to anesthesia    PT UNSURE OF FAMILY HISTORY  . Gallstones   . Hematuria    microscopic  . History of hiatal hernia   . History of kidney stones    H/O  . HLD (hyperlipidemia)   . Hoarseness    . Hypertension   . Lower extremity edema    pt states this is chronic and has been told she has lymph edema-wears compression stockings  . Lower leg edema   . Over weight   . PONV (postoperative nausea and vomiting)   . Renal cyst   . Seizures (HCC)    AS A CHILD AND THEN WITH 1 PREGNANCY BUT NONE SINCE  . Sleep apnea    NO CPAP  . Symptomatic cholelithiasis 04/14/2018  . Urge incontinence   . UTI (lower urinary tract infection)   . Vitamin D deficiency     Past Surgical History:  Procedure Laterality Date  . ABDOMINAL HYSTERECTOMY  1981  . BREAST BIOPSY  1995/2005  . CHOLECYSTECTOMY N/A 05/10/2018   Procedure: LAPAROSCOPIC CHOLECYSTECTOMY;  Surgeon: Vickie Epley, MD;  Location: [facility] ORS;  Service: General;  Laterality: N/A;  . COLONOSCOPY WITH PROPOFOL N/A 02/20/2019   Procedure: COLONOSCOPY WITH PROPOFOL;  Surgeon: Lucilla Lame, MD;  Location: [facility] ENDOSCOPY;  Service: Endoscopy;  Laterality: N/A;  . HERNIA REPAIR  2005  . LAPAROSCOPIC LYSIS OF ADHESIONS N/A 05/10/2018   Procedure: LAPAROSCOPIC LYSIS OF ADHESIONS;  Surgeon: Vickie Epley, MD;  Location: [facility] ORS;  Service: General;  Laterality: N/A;  . [place]  Family History  Problem Relation Age of Onset  . Cancer Paternal Aunt   . Stroke Paternal Uncle   . Prostate cancer Neg Hx   . Kidney disease Neg Hx   . Bladder Cancer Neg Hx     Social History   Social History Narrative  . Not on file     Review of Systems General: Denies fevers, chills, weight loss CV: Denies chest pain, shortness of breath, palpitations  Physical Exam Vitals with BMI 04/22/2021 02/18/2021 01/21/2021  Height 5\' 4"  5\' 4"  5\' 4"   Weight 261 lbs 256 lbs 255 lbs  BMI 44.78 70.96 28.36  Systolic 629 476 546  Diastolic 65 82 62  Pulse 88 85 71    General:  No acute distress,  Alert and oriented, Non-Toxic, Normal speech and affect Abdomen: Abdomen is soft nontender.  There are several surgical scars corresponding with  her previous operations.  I do not palpate any hernias.  She has an overhanging pannus with clear signs of chronic skin irritation in the crease beneath.  Assessment/Plan Patient is a reasonably good candidate for panniculectomy.  I discussed that this would address the skin inferior to the umbilicus but would leave the contour superior to the umbilicus relatively the same.  I do believe given the intra-abdominal adiposity that she has that it would be hard to improve the upper abdominal contour much even if it were extended through a abdominoplasty.  We discussed the details of the infraumbilical panniculectomy along with his location and orientation of the scars.  We discussed the risks include bleeding, infection, damage to surrounding structures need for additional procedures.  All of her questions were answered and we will plan to move forward.  Ann Barker 04/22/2021, 5:42 PM

## 2021-04-25 ENCOUNTER — Other Ambulatory Visit: Payer: Self-pay | Admitting: Primary Care

## 2021-04-25 DIAGNOSIS — K219 Gastro-esophageal reflux disease without esophagitis: Secondary | ICD-10-CM

## 2021-05-07 ENCOUNTER — Telehealth: Payer: Self-pay | Admitting: *Deleted

## 2021-05-07 NOTE — Telephone Encounter (Signed)
Surgical clearance form in Ann Barker's inbox surgery is set for 06/30/21, will place in Ann Barker for review to see if PCP can use last OV to clear her or does pt need an appt, please advise

## 2021-05-07 NOTE — Telephone Encounter (Signed)
She will need surgical clearance office visit. Please schedule.

## 2021-05-08 NOTE — Telephone Encounter (Signed)
Left voice message to call the office  

## 2021-05-13 NOTE — Telephone Encounter (Signed)
Left message to return call to our office.  

## 2021-05-18 NOTE — Telephone Encounter (Signed)
Left voice message to call the office  

## 2021-05-20 NOTE — Telephone Encounter (Signed)
Yes, thanks

## 2021-05-21 NOTE — Telephone Encounter (Signed)
Called  coordinator at [facility] and let them know we have been trying to reach patient for follow up for clearance but not able to reach. She will try to reach them and have her call our office for appointment. Ppw given back to [place].

## 2021-05-21 NOTE — Telephone Encounter (Signed)
Patient called back and is scheduled for 06/02/21.

## 2021-05-25 ENCOUNTER — Other Ambulatory Visit: Payer: Self-pay

## 2021-05-25 ENCOUNTER — Ambulatory Visit: Payer: Medicare HMO

## 2021-05-25 ENCOUNTER — Telehealth: Payer: Self-pay

## 2021-05-25 NOTE — Telephone Encounter (Signed)
Called patient to complete her AWV. Patient stated that she was at the [facility] hospital with her daughter at the moment and this was not a good time. Appointment was cancelled per patient request and patient will reschedule for a later date

## 2021-05-25 NOTE — Progress Notes (Signed)
error 

## 2021-06-02 ENCOUNTER — Ambulatory Visit (INDEPENDENT_AMBULATORY_CARE_PROVIDER_SITE_OTHER): Payer: Medicare HMO | Admitting: Primary Care

## 2021-06-02 ENCOUNTER — Other Ambulatory Visit: Payer: Self-pay

## 2021-06-02 ENCOUNTER — Encounter: Payer: Self-pay | Admitting: Primary Care

## 2021-06-02 VITALS — BP 110/62 | HR 86 | Temp 96.9°F | Ht 64.0 in | Wt 263.0 lb

## 2021-06-02 DIAGNOSIS — R7303 Prediabetes: Secondary | ICD-10-CM | POA: Diagnosis not present

## 2021-06-02 DIAGNOSIS — I10 Essential (primary) hypertension: Secondary | ICD-10-CM

## 2021-06-02 DIAGNOSIS — E65 Localized adiposity: Secondary | ICD-10-CM

## 2021-06-02 DIAGNOSIS — Z01818 Encounter for other preprocedural examination: Secondary | ICD-10-CM | POA: Diagnosis not present

## 2021-06-02 LAB — COMPREHENSIVE METABOLIC PANEL
ALT: 15 U/L (ref 0–35)
AST: 17 U/L (ref 0–37)
Albumin: 3.8 g/dL (ref 3.5–5.2)
Alkaline Phosphatase: 62 U/L (ref 39–117)
BUN: 19 mg/dL (ref 6–23)
CO2: 27 mEq/L (ref 19–32)
Calcium: 8.7 mg/dL (ref 8.4–10.5)
Chloride: 108 mEq/L (ref 96–112)
Creatinine, Ser: 0.95 mg/dL (ref 0.40–1.20)
GFR: 60.13 mL/min (ref >60.00)
Glucose, Bld: 98 mg/dL (ref 70–99)
Potassium: 3.5 mEq/L (ref 3.5–5.1)
Sodium: 141 mEq/L (ref 135–145)
Total Bilirubin: 0.5 mg/dL (ref 0.2–1.2)
Total Protein: 6.8 g/dL (ref 6.0–8.3)

## 2021-06-02 LAB — CBC
HCT: 37.6 % (ref 36.0–46.0)
Hemoglobin: 12.1 g/dL (ref 12.0–15.0)
MCHC: 32.2 g/dL (ref 30.0–36.0)
MCV: 88.3 fl (ref 78.0–100.0)
Platelets: 219 10*3/uL (ref 150.0–400.0)
RBC: 4.26 Mil/uL (ref 3.87–5.11)
RDW: 14.1 % (ref 11.5–15.5)
WBC: 7 10*3/uL (ref 4.0–10.5)

## 2021-06-02 LAB — LIPID PANEL
Cholesterol: 180 mg/dL (ref 0–200)
HDL: 65.5 mg/dL (ref >39.00)
LDL Cholesterol: 98 mg/dL (ref 0–99)
NonHDL: 114.26
Total CHOL/HDL Ratio: 3
Triglycerides: 83 mg/dL (ref 0.0–149.0)
VLDL: 16.6 mg/dL (ref 0.0–40.0)

## 2021-06-02 LAB — HEMOGLOBIN A1C: Hgb A1c MFr Bld: 5.9 % (ref 4.6–6.5)

## 2021-06-02 NOTE — Assessment & Plan Note (Signed)
Well controlled in the office today, continue olmesartan 20 mg.

## 2021-06-02 NOTE — Assessment & Plan Note (Signed)
Repeat A1C pending. 

## 2021-06-02 NOTE — Patient Instructions (Signed)
Stop by the lab prior to leaving today. I will notify you of your results once received.   Have the surgical center re-send the surgical clearance paperwork.   It was a pleasure to see you today!

## 2021-06-02 NOTE — Assessment & Plan Note (Signed)
Pending surgery for July 19th. We do not have her surgical clearance paperwork, she will have surgical center re-send.

## 2021-06-02 NOTE — Assessment & Plan Note (Signed)
Pending panniculitis surgery per plastics. Patient will notify to have them re-send surgical clearance paperwork.  ECG today with NSR, rate of 75, no ST Changes, maybe PVC. Appears similar to ECG from 2019.  Labs pending. Should be able to clear pending labs.

## 2021-06-02 NOTE — Progress Notes (Signed)
Subjective:    Patient ID: Ann Barker, female    DOB: Jan 04, 1951, 70 y.o.   MRN: [id number]  HPI  Ann Barker is a very pleasant 70 y.o. female with a history of hypertension, bowel obstructions, microscopic hematuria, frequent headaches, chronic fatigue, abdominal pannus, chronic venous insufficiency who presents today for surgical clearance.   She is pending surgery for abdominal pannus reduction for July 19th. She has no complaints today.   BP Readings from Last 3 Encounters:  06/02/21 110/62  04/22/21 101/65  02/18/21 122/82      Review of Systems  Constitutional:  Negative for unexpected weight change.  HENT:  Negative for rhinorrhea.   Respiratory:  Negative for cough and shortness of breath.   Cardiovascular:  Negative for chest pain.  Gastrointestinal:  Negative for constipation and diarrhea.  Genitourinary:  Negative for difficulty urinating.  Musculoskeletal:  Negative for arthralgias and myalgias.  Skin:  Negative for rash.  Allergic/Immunologic: Negative for environmental allergies.  Neurological:  Negative for dizziness and headaches.        Past Medical History:  Diagnosis Date   Acid reflux    Acute pain of right foot 10/08/2019   Anemia    Arthritis    Cancer ([place]) 1993   SKIN CANCER   Complication of anesthesia    HARD TO WAKE UP   Concussion 01/2017   Dizziness    Family history of adverse reaction to anesthesia    PT UNSURE OF FAMILY HISTORY   Gallstones    Hematuria    microscopic   History of hiatal hernia    History of kidney stones    H/O   HLD (hyperlipidemia)    Hoarseness    Hypertension    Lower extremity edema    pt states this is chronic and has been told she has lymph edema-wears compression stockings   Lower leg edema    Over weight    PONV (postoperative nausea and vomiting)    Renal cyst    Seizures (HCC)    AS A CHILD AND THEN WITH 1 PREGNANCY BUT NONE SINCE   Sleep apnea    NO CPAP   Symptomatic  cholelithiasis 04/14/2018   Urge incontinence    UTI (lower urinary tract infection)    Vitamin D deficiency     Social History   Socioeconomic History   Marital status: Divorced    Spouse name: Not on file   Number of children: Not on file   Years of education: Not on file   Highest education level: Not on file  Occupational History   Not on file  Tobacco Use   Smoking status: Never   Smokeless tobacco: Never  Vaping Use   Vaping Use: Never used  Substance and Sexual Activity   Alcohol use: No    Alcohol/week: 0.0 standard drinks   Drug use: No   Sexual activity: Not on file  Other Topics Concern   Not on file  Social History Narrative   Not on file   Social Determinants of Health   Financial Resource Strain: Not on file  Food Insecurity: Not on file  Transportation Needs: Not on file  Physical Activity: Not on file  Stress: Not on file  Social Connections: Not on file  Intimate Partner Violence: Not on file    Past Surgical History:  Procedure Laterality Date   ABDOMINAL HYSTERECTOMY  1981   BREAST BIOPSY  1995/2005   CHOLECYSTECTOMY N/A 05/10/2018  Procedure: LAPAROSCOPIC CHOLECYSTECTOMY;  Surgeon: Vickie Epley, MD;  Location: [facility];  Service: General;  Laterality: N/A;   COLONOSCOPY WITH PROPOFOL N/A 02/20/2019   Procedure: COLONOSCOPY WITH PROPOFOL;  Surgeon: Lucilla Lame, MD;  Location: [facility];  Service: Endoscopy;  Laterality: N/A;   HERNIA REPAIR  2005   LAPAROSCOPIC LYSIS OF ADHESIONS N/A 05/10/2018   Procedure: LAPAROSCOPIC LYSIS OF ADHESIONS;  Surgeon: Vickie Epley, MD;  Location: [facility] ORS;  Service: General;  Laterality: N/A;   TUBAL LIGATION  1975    Family History  Problem Relation Age of Onset   Cancer Paternal Aunt    Stroke Paternal Uncle    Prostate cancer Neg Hx    Kidney disease Neg Hx    Bladder Cancer Neg Hx     Allergies  Allergen Reactions   Morphine Other (See Comments)    Unknown   Clindamycin/Lincomycin  Diarrhea   Methylprednisolone Swelling   Sulfa Antibiotics    Codeine Palpitations and Other (See Comments)    unknown   Morphine And Related Palpitations   Penicillins Rash and Other (See Comments)    unknown    Current Outpatient Medications on File Prior to Visit  Medication Sig Dispense Refill   cholecalciferol (VITAMIN D) 1000 units tablet Take 1,000 Units by mouth daily.     folic acid (FOLVITE) 921 MCG tablet Take 800 mcg by mouth daily.     Multiple Vitamin (MULTIVITAMIN) capsule Take 1 capsule by mouth daily.     olmesartan (BENICAR) 20 MG tablet Take 1 tablet (20 mg total) by mouth daily. For blood pressure. 90 tablet 3   omeprazole (PRILOSEC) 20 MG capsule TAKE 1 CAPSULE BY MOUTH EVERY DAY FOR HEARTBURN 90 capsule 1   No current facility-administered medications on file prior to visit.    BP 110/62   Pulse 86   Temp (!) 96.9 F (36.1 C) (Temporal)   Ht 5\' 4"  (1.626 m)   Wt 263 lb (119.3 kg)   SpO2 98%   BMI 45.14 kg/m  Objective:   Physical Exam HENT:     Right Ear: Tympanic membrane and ear canal normal.     Left Ear: Tympanic membrane and ear canal normal.     Nose: Nose normal.  Eyes:     Conjunctiva/sclera: Conjunctivae normal.     Pupils: Pupils are equal, round, and reactive to light.  Neck:     Thyroid: No thyromegaly.  Cardiovascular:     Rate and Rhythm: Normal rate and regular rhythm.     Heart sounds: No murmur heard. Pulmonary:     Effort: Pulmonary effort is normal.     Breath sounds: Normal breath sounds. No rales.  Abdominal:     General: Bowel sounds are normal.     Palpations: Abdomen is soft.     Tenderness: There is no abdominal tenderness.  Musculoskeletal:        General: Normal range of motion.     Cervical back: Neck supple.  Lymphadenopathy:     Cervical: No cervical adenopathy.  Skin:    General: Skin is warm and dry.     Findings: No rash.  Neurological:     Mental Status: She is alert and oriented to person, place, and  time.     Cranial Nerves: No cranial nerve deficit.     Deep Tendon Reflexes: Reflexes are normal and symmetric.          Assessment & Plan:  This visit occurred during the SARS-CoV-2 public health emergency.  Safety protocols were in place, including screening questions prior to the visit, additional usage of staff PPE, and extensive cleaning of exam room while observing appropriate contact time as indicated for disinfecting solutions.

## 2021-06-04 ENCOUNTER — Telehealth: Payer: Self-pay | Admitting: Plastic Surgery

## 2021-06-04 NOTE — Telephone Encounter (Signed)
Received call from patient regarding upcoming surgery. Patient advised that she had contacted [facility] Medicare about her surgery authorization, and inquired about why [employer] would not pay for her upper abdomen to be reduced. Per patient, [employer] told her that the provider only submitted for the panniculectomy, and that we needed to submit for the abdominoplasty for review. Patient asked why we did not submit for the upper abdomen, as the "swelling in the upper abdomen causes her to have lots of problems like obstruction in her bowels, reflux, gallbladder issues. The whole stomach is the problem - not just the bottom part." I explained to Ann Barker that the panniculectomy has strict requirements for meeting medical necessity and that we were able to provide enough medical documentation for [employer] to approve that surgery. The upper abdominal area is coded as an abdominoplasty, which is almost always considered cosmetic by insurance guidelines.   According to [facility] medical coverage policy for Panniculectomy, Abdominoplasty, Abdominal Contouring:  "[employer] members may NOT be eligible under the Plan for the following procedures for any indications:  Abdominal liposuction (suction assisted lipectomy); OR  Abdominoplasty; OR  Rectus abdominis diastasis (RAD) repair These are considered cosmetic and are performed to improve or change appearance or self-esteem. Please refer to the member's individual certificate for the specific definition."  I explained that Medicare also considers the abdominoplasty to be cosmetic. I asked if she had any medical documentation that would support medical necessity, proving that the upper abdomen is indeed swelling and not adipose tissue, and that causes functional impairment, so that I could submit this for review under her [employer] policy. She stated that she "has seen many doctors and that the ones that might have it documented are not in [place], and she might not be able to get the  records." I advised that I would discuss this with Dr. Claudia Desanctis, but that if she is having significant medical conditions resulting from the upper abdominal swelling/adipose, she may need to be seen at a university setting as they are better equipped to handle complicated procedures.   I also discussed that insurance approvals are not always a guarantee of payment. The insurance companies advise that the procedure must still meet policy guidelines, including tho that may fall under coverage limitations and exclusions. If the surgery claim fails to meet medical necessity, the patient is financially responsible for the insurance-billed surgery. This is why we spend a considerable amount of time reviewing the policy guidelines; allowing [place] to adequately advise the patient of the criteria and associated outcomes, and our research-based opinions on whether we feel the insurance will cover the indicated procedure. I noted that, "In my opinion, the upper abdomen/abdominoplasty is not a covered benefit, but that I would submit for review if she desired. However, if the claim is denied, despite an Aeronautical engineer, she would be financially responsible for the cost of the surgery." Ann Barker voiced understand and agreement, and would like to continue as planned right now - and that I can discuss with Dr. Claudia Desanctis to get his input.   Upon discussion with Dr. Claudia Desanctis regarding all of the above information, Dr. Claudia Desanctis would like Ann Barker to be seen at a university setting. He advised that the initial plan for a panniculectomy was reasonable. However, the request for the upper abdominal liposuction/abdominoplasty, have warranted increased concern for her health and surgical outcome. Her health history and comorbidities, along with the possibility of underlying complications, are too risky for [place] to proceed with the elective-based  surgery.  I called Ann Barker to explain this information. The patient did not answer, but I was  able to leave a voicemail asking her to return my call. I will advise her that we will be happy to forward her records to the university setting of her choice, and that [facility] at [facility] and [facility] in [place] are the closest in proximity to our area. The surgery and all associated appointments have been canceled.

## 2021-06-09 ENCOUNTER — Encounter: Payer: Medicare HMO | Admitting: Surgical

## 2021-06-25 NOTE — Telephone Encounter (Signed)
Patient called our [place]- asking about referral that was suppose to have been sent by [facility] office. I saw this note and wanted to add that patient said she wanted to go to [facility] office not [facility]. Per referral notes it looks like it went to [place].  Please update patient on this when able. Thank you.

## 2021-07-08 ENCOUNTER — Encounter: Payer: Medicare HMO | Admitting: Plastic Surgery

## 2021-07-09 ENCOUNTER — Telehealth: Payer: Self-pay

## 2021-07-09 DIAGNOSIS — E65 Localized adiposity: Secondary | ICD-10-CM

## 2021-07-09 NOTE — Telephone Encounter (Signed)
I do not see a recent referral for lower abd pain - no GI referral or scans placed

## 2021-07-09 NOTE — Telephone Encounter (Signed)
Vm from pt requesting an update on referral for low abd pain.  Wants a call back at [phone].

## 2021-07-10 NOTE — Telephone Encounter (Signed)
I'm not sure what she's referring to.  Is she talking about her abdominal surgery for her pannus? It looks like her surgery was cancelled, why is that?

## 2021-07-13 NOTE — Telephone Encounter (Signed)
Called patient states that surgery was cancelled due to medical records. They were going to put referral in for [facility] but never did. Would like referral placed by our office.

## 2021-07-13 NOTE — Telephone Encounter (Signed)
Left message to return call to our office.  

## 2021-07-14 NOTE — Telephone Encounter (Signed)
It looks like patient was requesting abdominoplasty as well as pannus surgery and give her risk for complications they felt she needed to be seen at a university hospital.  See phone note from 06/04/21.  Will place referral for [facility].

## 2021-07-14 NOTE — Addendum Note (Signed)
Addended by: Pleas Koch on: 07/14/2021 06:56 AM   Modules accepted: Orders

## 2021-07-29 ENCOUNTER — Encounter: Payer: Medicare HMO | Admitting: Plastic Surgery

## 2021-09-14 DIAGNOSIS — K5730 Diverticulosis of large intestine without perforation or abscess without bleeding: Secondary | ICD-10-CM | POA: Diagnosis not present

## 2021-09-14 DIAGNOSIS — R197 Diarrhea, unspecified: Secondary | ICD-10-CM | POA: Diagnosis not present

## 2021-09-14 DIAGNOSIS — R55 Syncope and collapse: Secondary | ICD-10-CM | POA: Diagnosis not present

## 2021-09-14 DIAGNOSIS — I160 Hypertensive urgency: Secondary | ICD-10-CM | POA: Diagnosis not present

## 2021-09-14 DIAGNOSIS — R112 Nausea with vomiting, unspecified: Secondary | ICD-10-CM | POA: Diagnosis not present

## 2021-09-14 DIAGNOSIS — I890 Lymphedema, not elsewhere classified: Secondary | ICD-10-CM | POA: Diagnosis not present

## 2021-09-14 DIAGNOSIS — R1084 Generalized abdominal pain: Secondary | ICD-10-CM | POA: Diagnosis not present

## 2021-09-14 DIAGNOSIS — K529 Noninfective gastroenteritis and colitis, unspecified: Secondary | ICD-10-CM | POA: Diagnosis not present

## 2021-09-14 DIAGNOSIS — R531 Weakness: Secondary | ICD-10-CM | POA: Diagnosis not present

## 2021-09-14 DIAGNOSIS — R42 Dizziness and giddiness: Secondary | ICD-10-CM | POA: Diagnosis not present

## 2021-09-14 DIAGNOSIS — I10 Essential (primary) hypertension: Secondary | ICD-10-CM | POA: Diagnosis not present

## 2021-09-15 DIAGNOSIS — K529 Noninfective gastroenteritis and colitis, unspecified: Secondary | ICD-10-CM | POA: Diagnosis not present

## 2021-09-15 DIAGNOSIS — I517 Cardiomegaly: Secondary | ICD-10-CM | POA: Diagnosis not present

## 2021-09-15 DIAGNOSIS — I872 Venous insufficiency (chronic) (peripheral): Secondary | ICD-10-CM | POA: Diagnosis not present

## 2021-09-15 DIAGNOSIS — I160 Hypertensive urgency: Secondary | ICD-10-CM | POA: Diagnosis not present

## 2021-09-15 DIAGNOSIS — I351 Nonrheumatic aortic (valve) insufficiency: Secondary | ICD-10-CM | POA: Diagnosis not present

## 2021-09-15 DIAGNOSIS — K219 Gastro-esophageal reflux disease without esophagitis: Secondary | ICD-10-CM | POA: Diagnosis not present

## 2021-09-15 DIAGNOSIS — I10 Essential (primary) hypertension: Secondary | ICD-10-CM | POA: Diagnosis not present

## 2021-09-16 DIAGNOSIS — K529 Noninfective gastroenteritis and colitis, unspecified: Secondary | ICD-10-CM | POA: Diagnosis not present

## 2021-09-16 DIAGNOSIS — I872 Venous insufficiency (chronic) (peripheral): Secondary | ICD-10-CM | POA: Diagnosis not present

## 2021-09-16 DIAGNOSIS — K219 Gastro-esophageal reflux disease without esophagitis: Secondary | ICD-10-CM | POA: Diagnosis not present

## 2021-09-16 DIAGNOSIS — I10 Essential (primary) hypertension: Secondary | ICD-10-CM | POA: Diagnosis not present

## 2021-09-16 DIAGNOSIS — I160 Hypertensive urgency: Secondary | ICD-10-CM | POA: Diagnosis not present

## 2021-09-22 ENCOUNTER — Telehealth: Payer: Self-pay

## 2021-09-22 NOTE — Telephone Encounter (Signed)
Called pat and the mailbox is full

## 2021-09-22 NOTE — Telephone Encounter (Signed)
[facility] Night - Client Nonclinical Telephone Record AccessNurse Client [name] - Client Client Site [place] Physician [name] - NP Contact Type Call Who Is Calling Patient / Member / Family / Caregiver Caller Name [facility] Caller Phone Number [phone] Patient Name Ann Barker Patient DOB 08/14/1951 Call Type Message Only Information Provided Reason for Call Request to Schedule Office Appointment Initial Comment Caller states, needs an appt. Was in hospital. Patient request to speak to RN No Additional Comment Caller declined triage. Disp. Time Disposition Final User 09/21/2021 5:09:25 PM General Information Provided Yes Jobie Quaker Call Closed By: Jobie Quaker Transaction Date/Time: 09/21/2021 5:06:31 PM (ET)

## 2021-09-24 ENCOUNTER — Ambulatory Visit (INDEPENDENT_AMBULATORY_CARE_PROVIDER_SITE_OTHER): Payer: Medicare HMO | Admitting: Primary Care

## 2021-09-24 ENCOUNTER — Other Ambulatory Visit: Payer: Self-pay

## 2021-09-24 ENCOUNTER — Other Ambulatory Visit: Payer: Self-pay | Admitting: Primary Care

## 2021-09-24 ENCOUNTER — Encounter: Payer: Self-pay | Admitting: Primary Care

## 2021-09-24 DIAGNOSIS — I10 Essential (primary) hypertension: Secondary | ICD-10-CM | POA: Diagnosis not present

## 2021-09-24 LAB — BASIC METABOLIC PANEL
BUN: 26 mg/dL — ABNORMAL HIGH (ref 6–23)
CO2: 29 mEq/L (ref 19–32)
Calcium: 9.4 mg/dL (ref 8.4–10.5)
Chloride: 108 mEq/L (ref 96–112)
Creatinine, Ser: 1.16 mg/dL (ref 0.40–1.20)
GFR: 47.21 mL/min — ABNORMAL LOW (ref >60.00)
Glucose, Bld: 101 mg/dL — ABNORMAL HIGH (ref 70–99)
Potassium: 3.7 mEq/L (ref 3.5–5.1)
Sodium: 143 mEq/L (ref 135–145)
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
## 2021-09-24 NOTE — Patient Instructions (Signed)
Stop by the lab prior to leaving today. I will notify you of your results once received.   Continue taking olmesartan 20 mg and hydrochlorothiazide 25 mg for blood pressure.  It was a pleasure to see you today!

## 2021-09-24 NOTE — Assessment & Plan Note (Addendum)
Controlled in the office today, also controlled in June 2022 when managed only on olmesartan 20 mg.  Continue HCTZ 25 mg and olmesartan 20 mg.  Repeat BMP pending.   Hospital labs, imaging, and notes reviewed from admission to [facility].

## 2021-09-24 NOTE — Progress Notes (Signed)
Subjective:    Patient ID: Ann Barker, female    DOB: 1951/07/20, 70 y.o.   MRN: [id number]  HPI  Ann Barker is a very pleasant 70 y.o. female who presents today for hospital follow up.  She presented to [facility] ED on 09/14/21 for reports of dizziness, nausea, vomiting, diarrhea. She was noted to have hypertensive urgency. CT scan showed scattered diverticulosis without diverticulitis and benign hepatic cysts and small hiatal hernia. CT head negative. She was admitted for hypertensive urgency.  During her hospital stay amlodipine 10 mg and HCTZ 25 mg was added to her regimen. She was also treated for severe constipation with fleet enema and stool softeners; and treated with meclizine for dizziness. She was discharged home on 09/16/21.   She was last evaluated by [place] in June 2022, blood pressure during visit was 110/62 on olmesartan 20 mg. Today she endorses taking olmesartan 20 mg and HCTZ 25 mg. She's noticed some dizziness, room spinning sensation, she has not taken meclizine. She denies nausea and vomiting.   She has been coughing a lot since she's moved apartment buildings, suspects the people above her to be causing her cough by smoking "marijuana and something else". She's called law enforcement several times. She underwent CT abdomen/pelvis during her hospital stay which showed that heart was at the upper limits in terms of size without pericardial fluid.   BP Readings from Last 3 Encounters:  09/24/21 120/82  06/02/21 110/62  04/22/21 101/65      Review of Systems  Respiratory:  Negative for shortness of breath.   Cardiovascular:  Negative for chest pain.  Neurological:  Positive for dizziness. Negative for headaches.        Past Medical History:  Diagnosis Date   Acid reflux    Acute pain of right foot 10/08/2019   Anemia    Arthritis    Cancer ([place]) 1993   SKIN CANCER   Complication of anesthesia    HARD TO WAKE UP   Concussion 01/2017    Dizziness    Family history of adverse reaction to anesthesia    PT UNSURE OF FAMILY HISTORY   Gallstones    Hematuria    microscopic   History of hiatal hernia    History of kidney stones    H/O   HLD (hyperlipidemia)    Hoarseness    Hypertension    Lower extremity edema    pt states this is chronic and has been told she has lymph edema-wears compression stockings   Lower leg edema    Over weight    PONV (postoperative nausea and vomiting)    Renal cyst    Seizures ([facility])    AS A CHILD AND THEN WITH 1 PREGNANCY BUT NONE SINCE   Sleep apnea    NO CPAP   Symptomatic cholelithiasis 04/14/2018   Urge incontinence    UTI (lower urinary tract infection)    Vitamin D deficiency     Social History   Socioeconomic History   Marital status: Divorced    Spouse name: Not on file   Number of children: Not on file   Years of education: Not on file   Highest education level: Not on file  Occupational History   Not on file  Tobacco Use   Smoking status: Never   Smokeless tobacco: Never  Vaping Use   Vaping Use: Never used  Substance and Sexual Activity   Alcohol use: No    Alcohol/week:  0.0 standard drinks   Drug use: No   Sexual activity: Not on file  Other Topics Concern   Not on file  Social History Narrative   Not on file   Social Determinants of Health   Financial Resource Strain: Not on file  Food Insecurity: Not on file  Transportation Needs: Not on file  Physical Activity: Not on file  Stress: Not on file  Social Connections: Not on file  Intimate Partner Violence: Not on file    Past Surgical History:  Procedure Laterality Date   ABDOMINAL HYSTERECTOMY  1981   BREAST BIOPSY  1995/2005   CHOLECYSTECTOMY N/A 05/10/2018   Procedure: LAPAROSCOPIC CHOLECYSTECTOMY;  Surgeon: Vickie Epley, MD;  Location: [facility] ORS;  Service: General;  Laterality: N/A;   COLONOSCOPY WITH PROPOFOL N/A 02/20/2019   Procedure: COLONOSCOPY WITH PROPOFOL;  Surgeon: Lucilla Lame,  MD;  Location: [facility] ENDOSCOPY;  Service: Endoscopy;  Laterality: N/A;   HERNIA REPAIR  2005   LAPAROSCOPIC LYSIS OF ADHESIONS N/A 05/10/2018   Procedure: LAPAROSCOPIC LYSIS OF ADHESIONS;  Surgeon: Vickie Epley, MD;  Location: [facility] ORS;  Service: General;  Laterality: N/A;   TUBAL LIGATION  1975    Family History  Problem Relation Age of Onset   Cancer Paternal Aunt    Stroke Paternal Uncle    Prostate cancer Neg Hx    Kidney disease Neg Hx    Bladder Cancer Neg Hx     Allergies  Allergen Reactions   Morphine Other (See Comments)    Unknown   Clindamycin/Lincomycin Diarrhea   Methylprednisolone Swelling   Sulfa Antibiotics    Codeine Palpitations and Other (See Comments)    unknown   Morphine And Related Palpitations   Penicillins Rash and Other (See Comments)    unknown    Current Outpatient Medications on File Prior to Visit  Medication Sig Dispense Refill   cholecalciferol (VITAMIN D) 1000 units tablet Take 1,000 Units by mouth daily.     folic acid (FOLVITE) 539 MCG tablet Take 800 mcg by mouth daily.     hydrochlorothiazide (HYDRODIURIL) 25 MG tablet Take 25 mg by mouth daily.     Multiple Vitamin (MULTIVITAMIN) capsule Take 1 capsule by mouth daily.     olmesartan (BENICAR) 20 MG tablet Take 1 tablet (20 mg total) by mouth daily. For blood pressure. 90 tablet 3   omeprazole (PRILOSEC) 20 MG capsule TAKE 1 CAPSULE BY MOUTH EVERY DAY FOR HEARTBURN 90 capsule 1   No current facility-administered medications on file prior to visit.    BP 120/82   Pulse 83   Temp (!) 97.3 F (36.3 C) (Temporal)   Ht 5\' 4"  (1.626 m)   Wt 254 lb (115.2 kg)   SpO2 97%   BMI 43.60 kg/m  Objective:   Physical Exam Cardiovascular:     Rate and Rhythm: Normal rate and regular rhythm.  Pulmonary:     Effort: Pulmonary effort is normal.     Breath sounds: Normal breath sounds.  Musculoskeletal:     Cervical back: Neck supple.  Skin:    General: Skin is warm and dry.           Assessment & Plan:      This visit occurred during the SARS-CoV-2 public health emergency.  Safety protocols were in place, including screening questions prior to the visit, additional usage of staff PPE, and extensive cleaning of exam room while observing appropriate contact time as indicated for disinfecting solutions.

## 2021-10-01 ENCOUNTER — Other Ambulatory Visit: Payer: Self-pay

## 2021-10-01 ENCOUNTER — Other Ambulatory Visit (INDEPENDENT_AMBULATORY_CARE_PROVIDER_SITE_OTHER): Payer: Medicare HMO

## 2021-10-01 DIAGNOSIS — I10 Essential (primary) hypertension: Secondary | ICD-10-CM

## 2021-10-01 LAB — BASIC METABOLIC PANEL
BUN: 19 mg/dL (ref 6–23)
CO2: 29 mEq/L (ref 19–32)
Calcium: 9.2 mg/dL (ref 8.4–10.5)
Chloride: 107 mEq/L (ref 96–112)
Creatinine, Ser: 1.11 mg/dL (ref 0.40–1.20)
GFR: 49.77 mL/min — ABNORMAL LOW (ref >60.00)
Glucose, Bld: 104 mg/dL — ABNORMAL HIGH (ref 70–99)
Potassium: 3.4 mEq/L — ABNORMAL LOW (ref 3.5–5.1)
Sodium: 143 mEq/L (ref 135–145)

## 2021-10-05 ENCOUNTER — Telehealth: Payer: Self-pay | Admitting: Primary Care

## 2021-10-05 ENCOUNTER — Other Ambulatory Visit: Payer: Self-pay

## 2021-10-05 MED ORDER — HYDROCHLOROTHIAZIDE 12.5 MG PO TABS: 12.5000 mg | ORAL_TABLET | Freq: Every day | ORAL | 0 refills | Status: DC

## 2021-10-05 NOTE — Telephone Encounter (Signed)
Mrs. Kirtley called in returning [place] phone call to get her BP numbers

## 2021-10-05 NOTE — Telephone Encounter (Signed)
Called patient back states that her bp was 116/68 today

## 2021-10-06 NOTE — Telephone Encounter (Signed)
Noted. BP doing well on current regimen.

## 2021-10-13 ENCOUNTER — Other Ambulatory Visit: Payer: Self-pay | Admitting: Primary Care

## 2021-10-13 DIAGNOSIS — I10 Essential (primary) hypertension: Secondary | ICD-10-CM

## 2021-10-20 ENCOUNTER — Other Ambulatory Visit: Payer: Self-pay

## 2021-10-20 ENCOUNTER — Other Ambulatory Visit (INDEPENDENT_AMBULATORY_CARE_PROVIDER_SITE_OTHER): Payer: Medicare HMO

## 2021-10-20 DIAGNOSIS — I10 Essential (primary) hypertension: Secondary | ICD-10-CM

## 2021-10-21 LAB — BASIC METABOLIC PANEL
BUN: 21 mg/dL (ref 6–23)
CO2: 27 mEq/L (ref 19–32)
Calcium: 8.7 mg/dL (ref 8.4–10.5)
Chloride: 110 mEq/L (ref 96–112)
Creatinine, Ser: 1.05 mg/dL (ref 0.40–1.20)
GFR: 53.18 mL/min — ABNORMAL LOW (ref >60.00)
Glucose, Bld: 101 mg/dL — ABNORMAL HIGH (ref 70–99)
Potassium: 3.5 mEq/L (ref 3.5–5.1)
Sodium: 143 mEq/L (ref 135–145)

## 2021-10-26 ENCOUNTER — Other Ambulatory Visit: Payer: Self-pay | Admitting: Primary Care

## 2021-10-26 DIAGNOSIS — I10 Essential (primary) hypertension: Secondary | ICD-10-CM

## 2021-11-01 ENCOUNTER — Other Ambulatory Visit: Payer: Self-pay | Admitting: Primary Care

## 2021-11-01 DIAGNOSIS — K219 Gastro-esophageal reflux disease without esophagitis: Secondary | ICD-10-CM

## 2021-11-05 ENCOUNTER — Other Ambulatory Visit: Payer: Self-pay | Admitting: Primary Care

## 2021-11-05 DIAGNOSIS — I10 Essential (primary) hypertension: Secondary | ICD-10-CM

## 2021-11-16 DIAGNOSIS — Z9889 Other specified postprocedural states: Secondary | ICD-10-CM | POA: Diagnosis not present

## 2021-11-16 DIAGNOSIS — L669 Cicatricial alopecia, unspecified: Secondary | ICD-10-CM | POA: Diagnosis not present

## 2021-11-17 DIAGNOSIS — L669 Cicatricial alopecia, unspecified: Secondary | ICD-10-CM | POA: Insufficient documentation

## 2021-11-17 DIAGNOSIS — L6681 Central centrifugal cicatricial alopecia: Secondary | ICD-10-CM | POA: Insufficient documentation

## 2021-11-25 ENCOUNTER — Other Ambulatory Visit: Payer: Self-pay | Admitting: Primary Care

## 2021-11-25 ENCOUNTER — Other Ambulatory Visit (INDEPENDENT_AMBULATORY_CARE_PROVIDER_SITE_OTHER): Payer: Medicare HMO

## 2021-11-25 ENCOUNTER — Other Ambulatory Visit: Payer: Self-pay

## 2021-11-25 DIAGNOSIS — I10 Essential (primary) hypertension: Secondary | ICD-10-CM | POA: Diagnosis not present

## 2021-11-25 LAB — BASIC METABOLIC PANEL
BUN: 15 mg/dL (ref 6–23)
CO2: 30 mEq/L (ref 19–32)
Calcium: 8.9 mg/dL (ref 8.4–10.5)
Chloride: 107 mEq/L (ref 96–112)
Creatinine, Ser: 0.98 mg/dL (ref 0.40–1.20)
GFR: 57.73 mL/min — ABNORMAL LOW (ref >60.00)
Glucose, Bld: 95 mg/dL (ref 70–99)
Potassium: 3.6 mEq/L (ref 3.5–5.1)
Sodium: 143 mEq/L (ref 135–145)

## 2021-11-25 NOTE — Telephone Encounter (Signed)
Request from pharmacy to change to 90 day supply

## 2022-01-27 ENCOUNTER — Other Ambulatory Visit: Payer: Self-pay | Admitting: Primary Care

## 2022-01-27 DIAGNOSIS — K219 Gastro-esophageal reflux disease without esophagitis: Secondary | ICD-10-CM

## 2022-02-20 ENCOUNTER — Other Ambulatory Visit: Payer: Self-pay | Admitting: Primary Care

## 2022-02-20 DIAGNOSIS — I10 Essential (primary) hypertension: Secondary | ICD-10-CM

## 2022-03-02 ENCOUNTER — Ambulatory Visit (INDEPENDENT_AMBULATORY_CARE_PROVIDER_SITE_OTHER): Payer: Medicare HMO

## 2022-03-02 VITALS — Wt 250.0 lb

## 2022-03-02 DIAGNOSIS — Z0000 Encounter for general adult medical examination without abnormal findings: Secondary | ICD-10-CM | POA: Diagnosis not present

## 2022-03-02 NOTE — Patient Instructions (Signed)
Ms. Kukla , ?Thank you for taking time to come for your Medicare Wellness Visit. I appreciate your ongoing commitment to your health goals. Please review the following plan we discussed and let me know if I can assist you in the future.  ? ?Screening recommendations/referrals: ?Colonoscopy: Done 02/20/2019 - Repeat in 5 years ?Mammogram: Done 09/26/2019 - Repeat annually *past due - make appointment soon! ?Bone Density: Due* - Recommended every 2 years ?Recommended yearly ophthalmology/optometry visit for glaucoma screening and checkup ?Recommended yearly dental visit for hygiene and checkup ? ?Vaccinations: ?Influenza vaccine: Declined ?Pneumococcal vaccine: Done 01/18/2019 - due for Prevnar-13 ?Tdap vaccine: Doe 08/01/2016 - Repeat in 10 years ?Shingles vaccine: Zostavax done 2007 - Due for new vaccine - Shingrix is 2 doses 2-6 months apart and over 90% effective     ?Covid-19:Declined ? ?Advanced directives: Advance directive discussed with you today. Even though you declined this today, please call our office should you change your mind, and we can give you the proper paperwork for you to fill out.  ? ?Conditions/risks identified: Aim for 30 minutes of exercise or brisk walking, 6-8 glasses of water, and 5 servings of fruits and vegetables each day.  ? ?Next appointment: Follow up in one year for your annual wellness visit  ? ? ?Preventive Care 93 Years and Older, Female ?Preventive care refers to lifestyle choices and visits with your health care provider that can promote health and wellness. ?What does preventive care include? ?A yearly physical exam. This is also called an annual well check. ?Dental exams once or twice a year. ?Routine eye exams. Ask your health care provider how often you should have your eyes checked. ?Personal lifestyle choices, including: ?Daily care of your teeth and gums. ?Regular physical activity. ?Eating a healthy diet. ?Avoiding tobacco and drug use. ?Limiting alcohol use. ?Practicing  safe sex. ?Taking low-dose aspirin every day. ?Taking vitamin and mineral supplements as recommended by your health care provider. ?What happens during an annual well check? ?The services and screenings done by your health care provider during your annual well check will depend on your age, overall health, lifestyle risk factors, and family history of disease. ?Counseling  ?Your health care provider may ask you questions about your: ?Alcohol use. ?Tobacco use. ?Drug use. ?Emotional well-being. ?Home and relationship well-being. ?Sexual activity. ?Eating habits. ?History of falls. ?Memory and ability to understand (cognition). ?Work and work Statistician. ?Reproductive health. ?Screening  ?You may have the following tests or measurements: ?Height, weight, and BMI. ?Blood pressure. ?Lipid and cholesterol levels. These may be checked every 5 years, or more frequently if you are over 14 years old. ?Skin check. ?Lung cancer screening. You may have this screening every year starting at age 73 if you have a 30-pack-year history of smoking and currently smoke or have quit within the past 15 years. ?Fecal occult blood test (FOBT) of the stool. You may have this test every year starting at age 41. ?Flexible sigmoidoscopy or colonoscopy. You may have a sigmoidoscopy every 5 years or a colonoscopy every 10 years starting at age 42. ?Hepatitis C blood test. ?Hepatitis B blood test. ?Sexually transmitted disease (STD) testing. ?Diabetes screening. This is done by checking your blood sugar (glucose) after you have not eaten for a while (fasting). You may have this done every 1-3 years. ?Bone density scan. This is done to screen for osteoporosis. You may have this done starting at age 59. ?Mammogram. This may be done every 1-2 years. Talk to your health care  provider about how often you should have regular mammograms. ?Talk with your health care provider about your test results, treatment options, and if necessary, the need for more  tests. ?Vaccines  ?Your health care provider may recommend certain vaccines, such as: ?Influenza vaccine. This is recommended every year. ?Tetanus, diphtheria, and acellular pertussis (Tdap, Td) vaccine. You may need a Td booster every 10 years. ?Zoster vaccine. You may need this after age 71. ?Pneumococcal 13-valent conjugate (PCV13) vaccine. One dose is recommended after age 52. ?Pneumococcal polysaccharide (PPSV23) vaccine. One dose is recommended after age 91. ?Talk to your health care provider about which screenings and vaccines you need and how often you need them. ?This information is not intended to replace advice given to you by your health care provider. Make sure you discuss any questions you have with your health care provider. ?Document Released: 12/26/2015 Document Revised: 08/18/2016 Document Reviewed: 09/30/2015 ?Elsevier Interactive Patient Education ? 2017 [place]. ? ?Fall Prevention in the Home ?Falls can cause injuries. They can happen to people of all ages. There are many things you can do to make your home safe and to help prevent falls. ?What can I do on the outside of my home? ?Regularly fix the edges of walkways and driveways and fix any cracks. ?Remove anything that might make you trip as you walk through a door, such as a raised step or threshold. ?Trim any bushes or trees on the path to your home. ?Use bright outdoor lighting. ?Clear any walking paths of anything that might make someone trip, such as rocks or tools. ?Regularly check to see if handrails are loose or broken. Make sure that both sides of any steps have handrails. ?Any raised decks and porches should have guardrails on the edges. ?Have any leaves, snow, or ice cleared regularly. ?Use sand or salt on walking paths during winter. ?Clean up any spills in your garage right away. This includes oil or grease spills. ?What can I do in the bathroom? ?Use night lights. ?Install grab bars by the toilet and in the tub and shower.  Do not use towel bars as grab bars. ?Use non-skid mats or decals in the tub or shower. ?If you need to sit down in the shower, use a plastic, non-slip stool. ?Keep the floor dry. Clean up any water that spills on the floor as soon as it happens. ?Remove soap buildup in the tub or shower regularly. ?Attach bath mats securely with double-sided non-slip rug tape. ?Do not have throw rugs and other things on the floor that can make you trip. ?What can I do in the bedroom? ?Use night lights. ?Make sure that you have a light by your bed that is easy to reach. ?Do not use any sheets or blankets that are too big for your bed. They should not hang down onto the floor. ?Have a firm chair that has side arms. You can use this for support while you get dressed. ?Do not have throw rugs and other things on the floor that can make you trip. ?What can I do in the kitchen? ?Clean up any spills right away. ?Avoid walking on wet floors. ?Keep items that you use a lot in easy-to-reach places. ?If you need to reach something above you, use a strong step stool that has a grab bar. ?Keep electrical cords out of the way. ?Do not use floor polish or wax that makes floors slippery. If you must use wax, use non-skid floor wax. ?Do not have throw rugs  and other things on the floor that can make you trip. ?What can I do with my stairs? ?Do not leave any items on the stairs. ?Make sure that there are handrails on both sides of the stairs and use them. Fix handrails that are broken or loose. Make sure that handrails are as long as the stairways. ?Check any carpeting to make sure that it is firmly attached to the stairs. Fix any carpet that is loose or worn. ?Avoid having throw rugs at the top or bottom of the stairs. If you do have throw rugs, attach them to the floor with carpet tape. ?Make sure that you have a light switch at the top of the stairs and the bottom of the stairs. If you do not have them, ask someone to add them for you. ?What else  can I do to help prevent falls? ?Wear shoes that: ?Do not have high heels. ?Have rubber bottoms. ?Are comfortable and fit you well. ?Are closed at the toe. Do not wear sandals. ?If you use a stepladder

## 2022-03-02 NOTE — Progress Notes (Deleted)
? ?Subjective:  ? Ann Barker is a 71 y.o. female who presents for Medicare Annual (Subsequent) preventive examination. ? ?Review of Systems    ?*** ?  ? ?   ?Objective:  ?  ?There were no vitals filed for this visit. ?There is no height or weight on file to calculate BMI. ? ?Advanced Directives 07/22/2020 07/03/2020 07/03/2020 03/19/2020 09/30/2019 02/20/2019 11/14/2018  ?Does Patient Have a Medical Advance Directive? No No No No No No No  ?Does patient want to make changes to medical advance directive? - - - No - Patient declined - - -  ?Would patient like information on creating a medical advance directive? No - Patient declined No - Patient declined No - Patient declined - - No - Patient declined No - Patient declined  ? ? ?Current Medications (verified) ?Outpatient Encounter Medications as of 03/02/2022  ?Medication Sig  ? cholecalciferol (VITAMIN D) 1000 units tablet Take 1,000 Units by mouth daily.  ? folic acid (FOLVITE) 254 MCG tablet Take 800 mcg by mouth daily.  ? hydrochlorothiazide (HYDRODIURIL) 12.5 MG tablet TAKE 1 TABLET (12.5 MG TOTAL) BY MOUTH DAILY. FOR BLOOD PRESSURE.  ? Multiple Vitamin (MULTIVITAMIN) capsule Take 1 capsule by mouth daily.  ? olmesartan (BENICAR) 20 MG tablet TAKE 1 TABLET (20 MG TOTAL) BY MOUTH DAILY. FOR BLOOD PRESSURE.  ? omeprazole (PRILOSEC) 20 MG capsule TAKE 1 CAPSULE BY MOUTH EVERY DAY FOR HEARTBURN  ? ?No facility-administered encounter medications on file as of 03/02/2022.  ? ? ?Allergies (verified) ?Morphine, Clindamycin/lincomycin, Methylprednisolone, Sulfa antibiotics, Codeine, Morphine and related, and Penicillins  ? ?History: ?Past Medical History:  ?Diagnosis Date  ? Acid reflux   ? Acute pain of right foot 10/08/2019  ? Anemia   ? Arthritis   ? Cancer [facility]) 1993  ? SKIN CANCER  ? Complication of anesthesia   ? HARD TO WAKE UP  ? Concussion 01/2017  ? Dizziness   ? Family history of adverse reaction to anesthesia   ? PT UNSURE OF FAMILY HISTORY  ? Gallstones   ?  Hematuria   ? microscopic  ? History of hiatal hernia   ? History of kidney stones   ? H/O  ? HLD (hyperlipidemia)   ? Hoarseness   ? Hypertension   ? Lower extremity edema   ? pt states this is chronic and has been told she has lymph edema-wears compression stockings  ? Lower leg edema   ? Over weight   ? PONV (postoperative nausea and vomiting)   ? Renal cyst   ? Seizures ([place])   ? AS A CHILD AND THEN WITH 1 PREGNANCY BUT NONE SINCE  ? Sleep apnea   ? NO CPAP  ? Symptomatic cholelithiasis 04/14/2018  ? Urge incontinence   ? UTI (lower urinary tract infection)   ? Vitamin D deficiency   ? ?Past Surgical History:  ?Procedure Laterality Date  ? ABDOMINAL HYSTERECTOMY  1981  ? BREAST BIOPSY  1995/2005  ? CHOLECYSTECTOMY N/A 05/10/2018  ? Procedure: LAPAROSCOPIC CHOLECYSTECTOMY;  Surgeon: Vickie Epley, MD;  Location: [facility] ORS;  Service: General;  Laterality: N/A;  ? COLONOSCOPY WITH PROPOFOL N/A 02/20/2019  ? Procedure: COLONOSCOPY WITH PROPOFOL;  Surgeon: Lucilla Lame, MD;  Location: [facility] END[facility]COPY;  Service: Endoscopy;  Laterality: N/A;  ? HERNIA REPAIR  2005  ? LAPAROSCOPIC LYSIS OF ADHESIONS N/A 05/10/2018  ? Procedure: LAPAROSCOPIC LYSIS OF ADHESIONS;  Surgeon: Vickie Epley, MD;  Location: [facility] ORS;  Service: General;  Laterality: N/A;  ?  TUBAL LIGATION  1975  ? ?Family History  ?Problem Relation Age of Onset  ? Cancer Paternal Aunt   ? Stroke Paternal Uncle   ? Prostate cancer Neg Hx   ? Kidney disease Neg Hx   ? Bladder Cancer Neg Hx   ? ?Social History  ? ?Socioeconomic History  ? Marital status: Divorced  ?  Spouse name: Not on file  ? Number of children: Not on file  ? Years of education: Not on file  ? Highest education level: Not on file  ?Occupational History  ? Not on file  ?Tobacco Use  ? Smoking status: Never  ? Smokeless tobacco: Never  ?Vaping Use  ? Vaping Use: Never used  ?Substance and Sexual Activity  ? Alcohol use: No  ?  Alcohol/week: 0.0 standard drinks  ? Drug use: No  ? Sexual activity:  Not on file  ?Other Topics Concern  ? Not on file  ?Social History Narrative  ? Not on file  ? ?Social Determinants of Health  ? ?Financial Resource Strain: Not on file  ?Food Insecurity: Not on file  ?Transportation Needs: Not on file  ?Physical Activity: Not on file  ?Stress: Not on file  ?Social Connections: Not on file  ? ? ?Tobacco Counseling ?Counseling given: Not Answered ? ? ?Clinical Intake: ? ?  ? ?  ? ?  ? ?  ? ?  ? ?Diabetic?*** ? ?  ? ?  ? ? ?Activities of Daily Living ?No flowsheet data found. ? ?Patient Care Team: ?Pleas Koch, NP as PCP - General (Nurse Practitioner) ?Franchot Gallo, MD as Consulting Physician (Urology) ?Loney Loh, MD (Dermatology) ? ?Indicate any recent Medical Services you may have received from other than [facility] providers in the past year (date may be approximate). ? ?   ?Assessment:  ? This is a routine wellness examination for Ann Barker. ? ?Hearing/Vision screen ?No results found. ? ?Dietary issues and exercise activities discussed: ?  ? ? Goals Addressed   ?None ?  ? ?Depression Screen ?PHQ 2/9 Scores 03/19/2020 01/18/2019 01/09/2019 04/08/2017  ?PHQ - 2 Score 0 1 0 0  ?PHQ- 9 Score 0 7 0 -  ?  ?Fall Risk ?Fall Risk  06/02/2021 03/19/2020 01/09/2019 04/08/2017  ?Falls in the past year? 0 0 0 Yes  ?Comment - - - pt fell against brick  ?Number falls in past yr: 0 0 - 1  ?Injury with Fall? 0 0 - Yes  ?Risk for fall due to : - Medication side effect - -  ?Follow up - Falls evaluation completed;Falls prevention discussed - -  ? ? ?FALL RISK PREVENTION PERTAINING TO THE HOME: ? ?Any stairs in or around the home? {YES/NO:21197} ?If so, are there any without handrails? {YES/NO:21197} ?Home free of loose throw rugs in walkways, pet beds, electrical cords, etc? {YES/NO:21197} ?Adequate lighting in your home to reduce risk of falls? {YES/NO:21197} ? ?ASSISTIVE DEVICES UTILIZED TO PREVENT FALLS: ? ?Life alert? {YES/NO:21197} ?Use of a cane, walker or w/c? {YES/NO:21197} ?Grab bars in the  bathroom? {YES/NO:21197} ?Shower chair or bench in shower? {YES/NO:21197} ?Elevated toilet seat or a handicapped toilet? {YES/NO:21197} ? ?TIMED UP AND GO: ? ?Was the test performed? {YES/NO:21197}.  ?Length of time to ambulate 10 feet: *** sec.  ? ?{Appearance of Gait:2101803} ? ?Cognitive Function: ?MMSE - Mini Mental State Exam 03/19/2020 01/09/2019 04/08/2017  ?Orientation to time '5 5 5  '$ ?Orientation to Place '5 5 5  '$ ?Registration '3 3 3  '$ ?Attention/ Calculation  5 0 0  ?Recall '3 3 3  '$ ?Language- name 2 objects - 0 0  ?Language- repeat '1 1 1  '$ ?Language- follow 3 step command - 3 3  ?Language- read & follow direction - 0 0  ?Write a sentence - 0 0  ?Copy design - 0 0  ?Total score - 20 20  ? ?  ?  ? ?Immunizations ?Immunization History  ?Administered Date(s) Administered  ? Pneumococcal Polysaccharide-23 01/18/2019  ? Tdap 08/01/2016  ? Zoster, Live 12/13/2005  ? ? ?{TDAP status:2101805} ? ?{Flu Vaccine status:2101806} ? ?{Pneumococcal vaccine status:2101807} ? ?{Covid-19 vaccine status:2101808} ? ?Qualifies for Shingles Vaccine? {YES/NO:21197}  ?Zostavax completed {YES/NO:21197}  ?{Shingrix Completed?:2101804} ? ?Screening Tests ?Health Maintenance  ?Topic Date Due  ? COVID-19 Vaccine (1) Never done  ? Zoster Vaccines- Shingrix (1 of 2) Never done  ? DEXA SCAN  Never done  ? Pneumonia Vaccine 49+ Years old (2 - PCV) 01/19/2020  ? MAMMOGRAM  09/25/2021  ? INFLUENZA VACCINE  03/12/2022 (Originally 07/13/2021)  ? COLONOSCOPY (Pts 45-61yr Insurance coverage will need to be confirmed)  02/20/2024  ? TETANUS/TDAP  08/01/2026  ? Hepatitis C Screening  Completed  ? HPV VACCINES  Aged Out  ? ? ?Health Maintenance ? ?Health Maintenance Due  ?Topic Date Due  ? COVID-19 Vaccine (1) Never done  ? Zoster Vaccines- Shingrix (1 of 2) Never done  ? DEXA SCAN  Never done  ? Pneumonia Vaccine 73 Years old (2 - PCV) 01/19/2020  ? MAMMOGRAM  09/25/2021  ? ? ?{Colorectal cancer screening:2101809} ? ?{Mammogram status:21018020} ? ?{Bone  Density status:21018021} ? ?Lung Cancer Screening: (Low Dose CT Chest recommended if Age 73-80years, 30 pack-year currently smoking OR have quit w/in 15years.) {DOES NOT does:27190::"does not"} qualify.  ? ?

## 2022-03-02 NOTE — Progress Notes (Signed)
? ?Subjective:  ? Ann Barker is a 71 y.o. female who presents for Medicare Annual (Subsequent) preventive examination. ? ?Virtual Visit via Telephone Note ? ?I connected with  [name] on 03/02/22 at  2:45 PM EDT by telephone and verified that I am speaking with the correct person using two identifiers. ? ?Location: ?Patient: Home ?Provider: [facility] ?Persons participating in the virtual visit: patient/Nurse Health Advisor ?  ?I discussed the limitations, risks, security and privacy concerns of performing an evaluation and management service by telephone and the availability of in person appointments. The patient expressed understanding and agreed to proceed. ? ?Interactive audio and video telecommunications were attempted between this nurse and patient, however failed, due to patient having technical difficulties OR patient did not have access to video capability.  We continued and completed visit with audio only. ? ?Some vital signs may be absent or patient reported.  ? ?Ann Wildey Dionne Ano, LPN  ? ?Review of Systems    ? ?Cardiac Risk Factors include: advanced age (>58mn, >>81women);dyslipidemia;hypertension;obesity (BMI >30kg/m2);sedentary lifestyle;Other (see comment), Risk factor comments: chronic venous insufficiency, pre-diabetic ? ?   ?Objective:  ?  ?Today's Vitals  ? 03/02/22 1451  ?Weight: 250 lb (113.4 kg)  ? ?Body mass index is 42.91 kg/m?. ? ?Advanced Directives 03/02/2022 07/22/2020 07/03/2020 07/03/2020 03/19/2020 09/30/2019 02/20/2019  ?Does Patient Have a Medical Advance Directive? No No No No No No No  ?Does patient want to make changes to medical advance directive? - - - - No - Patient declined - -  ?Would patient like information on creating a medical advance directive? No - Patient declined No - Patient declined No - Patient declined No - Patient declined - - No - Patient declined  ? ? ?Current Medications (verified) ?Outpatient Encounter Medications as of 03/02/2022  ?Medication Sig  ?  cholecalciferol (VITAMIN D) 1000 units tablet Take 1,000 Units by mouth daily.  ? clobetasol ointment (TEMOVATE) 0.05 % Apply topically.  ? folic acid (FOLVITE) 8053MCG tablet Take 800 mcg by mouth daily.  ? hydrochlorothiazide (HYDRODIURIL) 12.5 MG tablet TAKE 1 TABLET (12.5 MG TOTAL) BY MOUTH DAILY. FOR BLOOD PRESSURE.  ? Multiple Vitamin (MULTIVITAMIN) capsule Take 1 capsule by mouth daily.  ? olmesartan (BENICAR) 20 MG tablet TAKE 1 TABLET (20 MG TOTAL) BY MOUTH DAILY. FOR BLOOD PRESSURE.  ? omeprazole (PRILOSEC) 20 MG capsule TAKE 1 CAPSULE BY MOUTH EVERY DAY FOR HEARTBURN  ? ?No facility-administered encounter medications on file as of 03/02/2022.  ? ? ?Allergies (verified) ?Morphine, Clindamycin/lincomycin, Methylprednisolone, Sulfa antibiotics, Codeine, Morphine and related, and Penicillins  ? ?History: ?Past Medical History:  ?Diagnosis Date  ? Acid reflux   ? Acute pain of right foot 10/08/2019  ? Anemia   ? Arthritis   ? Cancer ([facility] 1993  ? SKIN CANCER  ? Complication of anesthesia   ? HARD TO WAKE UP  ? Concussion 01/2017  ? Dizziness   ? Family history of adverse reaction to anesthesia   ? PT UNSURE OF FAMILY HISTORY  ? Gallstones   ? Hematuria   ? microscopic  ? History of hiatal hernia   ? History of kidney stones   ? H/O  ? HLD (hyperlipidemia)   ? Hoarseness   ? Hypertension   ? Lower extremity edema   ? pt states this is chronic and has been told she has lymph edema-wears compression stockings  ? Lower leg edema   ? Over weight   ? PONV (postoperative nausea  and vomiting)   ? Renal cyst   ? Seizures ([place])   ? AS A CHILD AND THEN WITH 1 PREGNANCY BUT NONE SINCE  ? Sleep apnea   ? NO CPAP  ? Symptomatic cholelithiasis 04/14/2018  ? Urge incontinence   ? UTI (lower urinary tract infection)   ? Vitamin D deficiency   ? ?Past Surgical History:  ?Procedure Laterality Date  ? ABDOMINAL HYSTERECTOMY  1981  ? BREAST BIOPSY  1995/2005  ? CHOLECYSTECTOMY N/A 05/10/2018  ? Procedure: LAPAROSCOPIC CHOLECYSTECTOMY;   Surgeon: Vickie Epley, MD;  Location: [facility] ORS;  Service: General;  Laterality: N/A;  ? COLONOSCOPY WITH PROPOFOL N/A 02/20/2019  ? Procedure: COLONOSCOPY WITH PROPOFOL;  Surgeon: Lucilla Lame, MD;  Location: [facility] ENDOSCOPY;  Service: Endoscopy;  Laterality: N/A;  ? HERNIA REPAIR  2005  ? LAPAROSCOPIC LYSIS OF ADHESIONS N/A 05/10/2018  ? Procedure: LAPAROSCOPIC LYSIS OF ADHESIONS;  Surgeon: Vickie Epley, MD;  Location: [facility] ORS;  Service: General;  Laterality: N/A;  ? [place]  ? ?Family History  ?Problem Relation Age of Onset  ? Cancer Paternal Aunt   ? Stroke Paternal Uncle   ? Prostate cancer Neg Hx   ? Kidney disease Neg Hx   ? Bladder Cancer Neg Hx   ? ?Social History  ? ?Socioeconomic History  ? Marital status: Divorced  ?  Spouse name: Not on file  ? Number of children: 3  ? Years of education: Not on file  ? Highest education level: Not on file  ?Occupational History  ? Occupation: retired  ?Tobacco Use  ? Smoking status: Never  ? Smokeless tobacco: Never  ?Vaping Use  ? Vaping Use: Never used  ?Substance and Sexual Activity  ? Alcohol use: No  ?  Alcohol/week: 0.0 standard drinks  ? Drug use: No  ? Sexual activity: Not on file  ?Other Topics Concern  ? Not on file  ?Social History Narrative  ? Her disabled child lives with her  ? ?Social Determinants of Health  ? ?Financial Resource Strain: Low Risk   ? Difficulty of Paying Living Expenses: Not hard at all  ?Food Insecurity: No Food Insecurity  ? Worried About Charity fundraiser in the Last Year: Never true  ? Ran Out of Food in the Last Year: Never true  ?Transportation Needs: No Transportation Needs  ? Lack of Transportation (Medical): No  ? Lack of Transportation (Non-Medical): No  ?Physical Activity: Insufficiently Active  ? Days of Exercise per Week: 7 days  ? Minutes of Exercise per Session: 20 min  ?Stress: No Stress Concern Present  ? Feeling of Stress : Not at all  ?Social Connections: Moderately Integrated  ? Frequency of  Communication with Friends and Family: More than three times a week  ? Frequency of Social Gatherings with Friends and Family: More than three times a week  ? Attends Religious Services: More than 4 times per year  ? Active Member of Clubs or Organizations: Yes  ? Attends Archivist Meetings: More than 4 times per year  ? Marital Status: Divorced  ? ? ?Tobacco Counseling ?Counseling given: Not Answered ? ? ?Clinical Intake: ? ?Pre-visit preparation completed: Yes ? ?Pain : No/denies pain ? ?  ? ?BMI - recorded: 42.91 ?Nutritional Status: BMI > 30  Obese ?Nutritional Risks: None ?Diabetes: No ? ?How often do you need to have someone help you when you read instructions, pamphlets, or other written materials from your doctor or  pharmacy?: 1 - Never ? ?Diabetic? no ? ?Interpreter Needed?: No ? ?Information entered by :: Shunda Rabadi, LPN ? ? ?Activities of Daily Living ?In your present state of health, do you have any difficulty performing the following activities: 03/02/2022  ?Hearing? N  ?Vision? N  ?Difficulty concentrating or making decisions? N  ?Walking or climbing stairs? N  ?Dressing or bathing? N  ?Doing errands, shopping? N  ?Preparing Food and eating ? N  ?Using the Toilet? N  ?In the past six months, have you accidently leaked urine? Y  ?Comment wears pads for protection - has seen Urology - unable to undergo surgery  ?Do you have problems with loss of bowel control? N  ?Managing your Medications? N  ?Managing your Finances? N  ?Housekeeping or managing your Housekeeping? N  ?Some recent data might be hidden  ? ? ?Patient Care Team: ?Pleas Koch, NP as PCP - General (Nurse Practitioner) ?Loney Loh, MD (Dermatology) ? ?Indicate any recent Medical Services you may have received from other than [facility] providers in the past year (date may be approximate). ? ?   ?Assessment:  ? This is a routine wellness examination for [place]. ? ?Hearing/Vision screen ?Hearing Screening - Comments:: Denies  hearing difficulties   ?Vision Screening - Comments:: Wears otc reading glasses prn - no optometrist at this time ? ?Dietary issues and exercise activities discussed: ?Current Exercise Habits: Home exercise routine

## 2022-03-18 ENCOUNTER — Telehealth: Payer: Self-pay

## 2022-03-18 NOTE — Telephone Encounter (Signed)
Patient is due for general follow-up anyway, go ahead and schedule her for a visit with me so we can discuss further. ?

## 2022-03-18 NOTE — Telephone Encounter (Signed)
Patent called states you had referred her to surgeon last year about spot on her stomach. They could not do any procedure due to complications. States that you told her to call if it became issue. She states about two weeks ago it started leaking and has odor. It feels like it is open. She states that she has tissue over area and drainage is clear and very smelly. She did have some that was a brownish. Denies any fever, nausea or vomiting. Area is tender bur not warm or swollen. She sates you told her not to use peroxide but the powder you told her to use did not work. She feels like the doctor that did her first surgery left something in her. I have reviewed red words and will get seen over the weekend if any  ?

## 2022-03-22 NOTE — Telephone Encounter (Signed)
Patient was not able to come in this week due to other appointments. She has scheduled something for next week. She states that she has had improvement in abdominal area and has dried up.  ?

## 2022-03-30 ENCOUNTER — Ambulatory Visit (INDEPENDENT_AMBULATORY_CARE_PROVIDER_SITE_OTHER): Payer: Medicare HMO | Admitting: Primary Care

## 2022-03-30 ENCOUNTER — Encounter: Payer: Self-pay | Admitting: Primary Care

## 2022-03-30 VITALS — BP 126/84 | HR 87 | Temp 98.6°F | Ht 64.0 in | Wt 263.0 lb

## 2022-03-30 DIAGNOSIS — I890 Lymphedema, not elsewhere classified: Secondary | ICD-10-CM | POA: Diagnosis not present

## 2022-03-30 DIAGNOSIS — E65 Localized adiposity: Secondary | ICD-10-CM | POA: Diagnosis not present

## 2022-03-30 DIAGNOSIS — I10 Essential (primary) hypertension: Secondary | ICD-10-CM | POA: Diagnosis not present

## 2022-03-30 DIAGNOSIS — E785 Hyperlipidemia, unspecified: Secondary | ICD-10-CM

## 2022-03-30 DIAGNOSIS — R7303 Prediabetes: Secondary | ICD-10-CM | POA: Diagnosis not present

## 2022-03-30 DIAGNOSIS — Z6841 Body Mass Index (BMI) 40.0 and over, adult: Secondary | ICD-10-CM | POA: Diagnosis not present

## 2022-03-30 NOTE — Patient Instructions (Addendum)
You will be contacted regarding your referral to plastic surgery.  Please let us know if you have not been contacted within two weeks.  ? ?Stop by the lab prior to leaving today. I will notify you of your results once received.  ? ?It was a pleasure to see you today! ? ? ?

## 2022-03-30 NOTE — Assessment & Plan Note (Signed)
Referral placed for plastic surgery per patient request. ? ?Will complete separate pre-op visit if warranted. ? ?

## 2022-03-30 NOTE — Progress Notes (Signed)
? ?Subjective:  ? ? Patient ID: Ann Barker, female    DOB: 1951-04-18, 71 y.o.   MRN: [id number] ? ?HPI ? ?Ann Barker is a very pleasant 71 y.o. female with a history of hypertension, GERD, partial bowel obstruction, folliculitis decalvans, bilateral renal cysts, hyperlipidemia, lymphedema, prediabetes who presents today for follow up of chronic conditions and to discuss ongoing abdominal pannus.  ? ?1) Hypertension: Currently managed on HCTZ 12.5 mg daily, olmesartan 20 mg daily. ? ?She denies chest pain, dizziness. She endorses compliance to her medications as prescribed.  ? ? ?BP Readings from Last 3 Encounters:  ?03/30/22 126/84  ?09/24/21 120/82  ?06/02/21 110/62  ? ?2) Abdominal Pannus: Evaluated in June 2022 for preoperative exam for abdominal pannus reduction surgery for July 2022.  Chronic history of large abdominal pannus which causes a lot of sweating, moisture buildup, skin breakdown. ? ?She never underwent surgery. She received a call from the plastic surgeon prior to her surgery date stating that her case was "too complicated".  ? ?She continues to notice leakage under her left abdominal fold/left groin from a surgery years ago for partial hysterectomy. She suspects disruption to her lymphatic system. She continues to request surgery for her abdominal panus, but she is also wanting a reduction of her upper abdomen.  ? ?She is needing a new referral.  ? ?3) Prediabetes: Chronic with A1C fluctuating from 5.6-5.9 over the years.  No recent A1c on file.  ? ?Review of Systems  ?Eyes:  Negative for visual disturbance.  ?Respiratory:  Negative for shortness of breath.   ?Cardiovascular:  Negative for chest pain.  ?Neurological:  Negative for dizziness and headaches.  ? ?   ? ? ?Past Medical History:  ?Diagnosis Date  ? Acid reflux   ? Acute pain of right foot 10/08/2019  ? Anemia   ? Arthritis   ? Cancer [facility]) 1993  ? SKIN CANCER  ? Complication of anesthesia   ? HARD TO WAKE UP  ? Concussion 01/2017   ? Dizziness   ? Family history of adverse reaction to anesthesia   ? PT UNSURE OF FAMILY HISTORY  ? Gallstones   ? Hematuria   ? microscopic  ? History of hiatal hernia   ? History of kidney stones   ? H/O  ? HLD (hyperlipidemia)   ? Hoarseness   ? Hypertension   ? Lower extremity edema   ? pt states this is chronic and has been told she has lymph edema-wears compression stockings  ? Lower leg edema   ? Over weight   ? PONV (postoperative nausea and vomiting)   ? Renal cyst   ? Seizures ([place])   ? AS A CHILD AND THEN WITH 1 PREGNANCY BUT NONE SINCE  ? Sleep apnea   ? NO CPAP  ? Symptomatic cholelithiasis 04/14/2018  ? Urge incontinence   ? UTI (lower urinary tract infection)   ? Vitamin D deficiency   ? ? ?Social History  ? ?Socioeconomic History  ? Marital status: Divorced  ?  Spouse name: Not on file  ? Number of children: 3  ? Years of education: Not on file  ? Highest education level: Not on file  ?Occupational History  ? Occupation: retired  ?Tobacco Use  ? Smoking status: Never  ? Smokeless tobacco: Never  ?Vaping Use  ? Vaping Use: Never used  ?Substance and Sexual Activity  ? Alcohol use: No  ?  Alcohol/week: 0.0 standard drinks  ? Drug  use: No  ? Sexual activity: Not on file  ?Other Topics Concern  ? Not on file  ?Social History Narrative  ? Her disabled child lives with her  ? ?Social Determinants of Health  ? ?Financial Resource Strain: Low Risk   ? Difficulty of Paying Living Expenses: Not hard at all  ?Food Insecurity: No Food Insecurity  ? Worried About Charity fundraiser in the Last Year: Never true  ? Ran Out of Food in the Last Year: Never true  ?Transportation Needs: No Transportation Needs  ? Lack of Transportation (Medical): No  ? Lack of Transportation (Non-Medical): No  ?Physical Activity: Insufficiently Active  ? Days of Exercise per Week: 7 days  ? Minutes of Exercise per Session: 20 min  ?Stress: No Stress Concern Present  ? Feeling of Stress : Not at all  ?Social Connections: Moderately  Integrated  ? Frequency of Communication with Friends and Family: More than three times a week  ? Frequency of Social Gatherings with Friends and Family: More than three times a week  ? Attends Religious Services: More than 4 times per year  ? Active Member of Clubs or Organizations: Yes  ? Attends Archivist Meetings: More than 4 times per year  ? Marital Status: Divorced  ?Intimate Partner Violence: Not At Risk  ? Fear of Current or Ex-Partner: No  ? Emotionally Abused: No  ? Physically Abused: No  ? Sexually Abused: No  ? ? ?Past Surgical History:  ?Procedure Laterality Date  ? ABDOMINAL HYSTERECTOMY  1981  ? BREAST BIOPSY  1995/2005  ? CHOLECYSTECTOMY N/A 05/10/2018  ? Procedure: LAPAROSCOPIC CHOLECYSTECTOMY;  Surgeon: Vickie Epley, MD;  Location: [facility] ORS;  Service: General;  Laterality: N/A;  ? COLONOSCOPY WITH PROPOFOL N/A 02/20/2019  ? Procedure: COLONOSCOPY WITH PROPOFOL;  Surgeon: Lucilla Lame, MD;  Location: [facility] ENDOSCOPY;  Service: Endoscopy;  Laterality: N/A;  ? HERNIA REPAIR  2005  ? LAPAROSCOPIC LYSIS OF ADHESIONS N/A 05/10/2018  ? Procedure: LAPAROSCOPIC LYSIS OF ADHESIONS;  Surgeon: Vickie Epley, MD;  Location: [facility] ORS;  Service: General;  Laterality: N/A;  ? [place]  ? ? ?Family History  ?Problem Relation Age of Onset  ? Cancer Paternal Aunt   ? Stroke Paternal Uncle   ? Prostate cancer Neg Hx   ? Kidney disease Neg Hx   ? Bladder Cancer Neg Hx   ? ? ?Allergies  ?Allergen Reactions  ? Morphine Other (See Comments)  ?  Unknown  ? Clindamycin/Lincomycin Diarrhea  ? Methylprednisolone Swelling  ? Sulfa Antibiotics   ? Codeine Palpitations and Other (See Comments)  ?  unknown  ? Morphine And Related Palpitations  ? Penicillins Rash and Other (See Comments)  ?  unknown  ? ? ?Current Outpatient Medications on File Prior to Visit  ?Medication Sig Dispense Refill  ? cholecalciferol (VITAMIN D) 1000 units tablet Take 1,000 Units by mouth daily.    ? clobetasol ointment  (TEMOVATE) 0.05 % Apply topically.    ? folic acid (FOLVITE) 885 MCG tablet Take 800 mcg by mouth daily.    ? hydrochlorothiazide (HYDRODIURIL) 12.5 MG tablet TAKE 1 TABLET (12.5 MG TOTAL) BY MOUTH DAILY. FOR BLOOD PRESSURE. 90 tablet 1  ? Multiple Vitamin (MULTIVITAMIN) capsule Take 1 capsule by mouth daily.    ? olmesartan (BENICAR) 20 MG tablet TAKE 1 TABLET (20 MG TOTAL) BY MOUTH DAILY. FOR BLOOD PRESSURE. 90 tablet 1  ? omeprazole (PRILOSEC) 20 MG capsule TAKE 1  CAPSULE BY MOUTH EVERY DAY FOR HEARTBURN 90 capsule 0  ? ?No current facility-administered medications on file prior to visit.  ? ? ?BP 126/84   Pulse 87   Temp 98.6 ?F (37 ?C) (Oral)   Ht '5\' 4"'$  (1.626 m)   Wt 263 lb (119.3 kg)   SpO2 96%   BMI 45.14 kg/m?  ?Objective:  ? Physical Exam ?Cardiovascular:  ?   Rate and Rhythm: Normal rate and regular rhythm.  ?Pulmonary:  ?   Effort: Pulmonary effort is normal.  ?   Breath sounds: Normal breath sounds.  ?Musculoskeletal:  ?   Cervical back: Neck supple.  ?Skin: ?   General: Skin is warm and dry.  ?   Comments: Large abdominal panus noted on exam.  ?No erythema, drainage, or skin breakdown noted.  ? ? ? ? ? ?   ?Assessment & Plan:  ? ? ? ? ?This visit occurred during the SARS-CoV-2 public health emergency.  Safety protocols were in place, including screening questions prior to the visit, additional usage of staff PPE, and extensive cleaning of exam room while observing appropriate contact time as indicated for disinfecting solutions.  ?

## 2022-03-30 NOTE — Assessment & Plan Note (Signed)
Controlled. ? ?Continue HCTZ 12.5 mg and olmesartan 20 mg daily. ?BMP reviewed from October 2022. ? ? ?

## 2022-03-30 NOTE — Assessment & Plan Note (Signed)
Discussed the importance of a healthy diet and regular exercise in order for weight loss, and to reduce the risk of further co-morbidity. ? ?Repeat A1C pending. ?

## 2022-03-30 NOTE — Assessment & Plan Note (Signed)
Stable. ? ?No concerns today. ?

## 2022-03-30 NOTE — Assessment & Plan Note (Signed)
Not currently on medication. ? ?Repeat lipid panel pending. ?

## 2022-03-31 LAB — COMPREHENSIVE METABOLIC PANEL
ALT: 22 U/L (ref 0–35)
AST: 22 U/L (ref 0–37)
Albumin: 3.8 g/dL (ref 3.5–5.2)
Alkaline Phosphatase: 63 U/L (ref 39–117)
BUN: 25 mg/dL — ABNORMAL HIGH (ref 6–23)
CO2: 26 mEq/L (ref 19–32)
Calcium: 9.1 mg/dL (ref 8.4–10.5)
Chloride: 106 mEq/L (ref 96–112)
Creatinine, Ser: 1.1 mg/dL (ref 0.40–1.20)
GFR: 50.13 mL/min — ABNORMAL LOW (ref >60.00)
Glucose, Bld: 88 mg/dL (ref 70–99)
Potassium: 3.7 mEq/L (ref 3.5–5.1)
Sodium: 141 mEq/L (ref 135–145)
Total Bilirubin: 0.5 mg/dL (ref 0.2–1.2)
Total Protein: 7.2 g/dL (ref 6.0–8.3)

## 2022-03-31 LAB — LIPID PANEL
Cholesterol: 190 mg/dL (ref 0–200)
HDL: 63.7 mg/dL (ref >39.00)
LDL Cholesterol: 99 mg/dL (ref 0–99)
NonHDL: 126.34
Total CHOL/HDL Ratio: 3
Triglycerides: 138 mg/dL (ref 0.0–149.0)
VLDL: 27.6 mg/dL (ref 0.0–40.0)

## 2022-03-31 LAB — CBC
HCT: 35.1 % — ABNORMAL LOW (ref 36.0–46.0)
Hemoglobin: 11.6 g/dL — ABNORMAL LOW (ref 12.0–15.0)
MCHC: 33.2 g/dL (ref 30.0–36.0)
MCV: 88.1 fl (ref 78.0–100.0)
Platelets: 238 10*3/uL (ref 150.0–400.0)
RBC: 3.98 Mil/uL (ref 3.87–5.11)
RDW: 14 % (ref 11.5–15.5)
WBC: 8.8 10*3/uL (ref 4.0–10.5)

## 2022-03-31 LAB — HEMOGLOBIN A1C: Hgb A1c MFr Bld: 6 % (ref 4.6–6.5)

## 2022-04-01 ENCOUNTER — Telehealth: Payer: Self-pay

## 2022-04-01 NOTE — Telephone Encounter (Signed)
-----   Message from Pleas Koch, NP sent at 04/01/2022  7:25 AM EDT ----- ?Please call patient: ? ?Kidney function and blood counts are overall stable. ? ?Liver, electrolytes, cholesterol look good. ? ?Blood sugars are still in the prediabetic range, about the same as last time. ? ?Needs to work on weight loss through a healthy diet and exercise to prevent type 2 diabetes. ?

## 2022-04-01 NOTE — Telephone Encounter (Signed)
Tried to call pt regarding lab her vm was full. ?

## 2022-04-23 ENCOUNTER — Telehealth: Payer: Self-pay

## 2022-04-23 ENCOUNTER — Other Ambulatory Visit: Payer: Self-pay | Admitting: Primary Care

## 2022-04-23 DIAGNOSIS — I10 Essential (primary) hypertension: Secondary | ICD-10-CM

## 2022-04-23 DIAGNOSIS — E65 Localized adiposity: Secondary | ICD-10-CM

## 2022-04-27 NOTE — Telephone Encounter (Signed)
Please update the patient regarding this message. ? ?Does she have the name of someone else she would like to see? ?

## 2022-04-28 NOTE — Addendum Note (Signed)
Addended by: Pleas Koch on: 04/28/2022 01:15 PM ? ? Modules accepted: Orders ? ?

## 2022-04-28 NOTE — Telephone Encounter (Signed)
Called patient she does not have another person as long as it is [place] area.  ?

## 2022-04-28 NOTE — Telephone Encounter (Signed)
Noted, new referral placed per patient request. ?

## 2022-05-06 ENCOUNTER — Telehealth: Payer: Self-pay

## 2022-05-06 NOTE — Telephone Encounter (Signed)
Can we fax my office notes and her visit with the plastic surgeon notes?

## 2022-05-06 NOTE — Telephone Encounter (Signed)
Pt called and spoke to a hospital in [place], [place]. Needs records faxed to Dr Simonne Come [facility] Fax notes to [phone]. This Dr will look at the records and decide if she can help the pt.

## 2022-05-13 NOTE — Telephone Encounter (Signed)
Spoke with patient, pt is going to call to schedule her appt  Referral sent to  [facility] - [facility], [place], [place] [address]  They have offices in [place] area

## 2022-05-21 NOTE — Telephone Encounter (Signed)
Notes have been faxed. No further action needed at this time.

## 2022-06-20 IMAGING — CT CT ABD-PELV W/ CM
2 of 5 series · 16 of 46 positions shown, 18 images · IV contrast (APPLIED)
Comparison: 04/10/2018

CLINICAL DATA: Left lower quadrant pain and vomiting

EXAM:
CT ABDOMEN AND PELVIS WITH CONTRAST
TECHNIQUE: Multidetector CT imaging of the abdomen and pelvis was performed
using the standard protocol following bolus administration of
intravenous contrast.
CONTRAST:  125mL OMNIPAQUE IOHEXOL 300 MG/ML  SOLN

[Series 2: routine abd/pel with · axial · 0.87mm/px · z∈[-575,-145]mm · 13 of 98 slices shown, 15 images]
[im 6/98  soft-tissue]
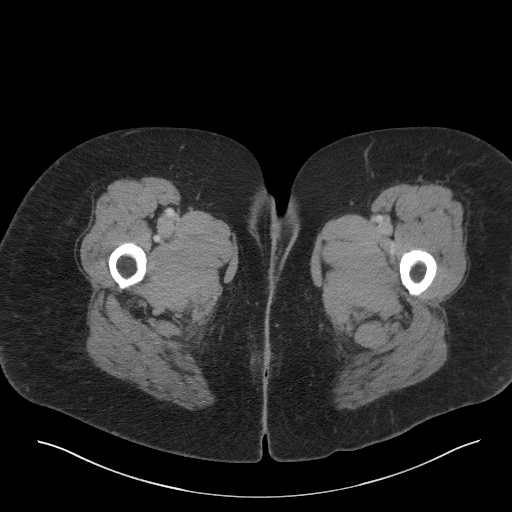
[im 6/98  bone]
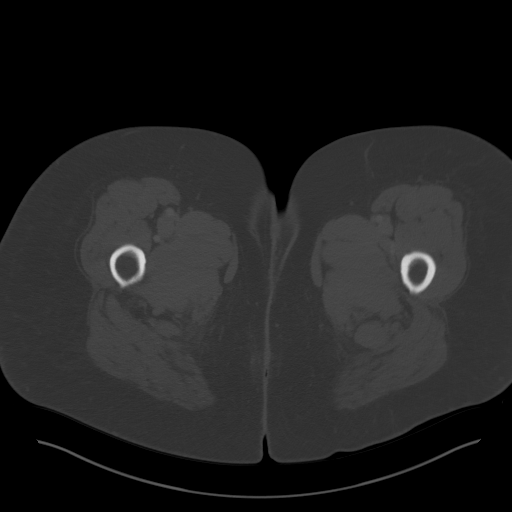
[im 11/98  soft-tissue]
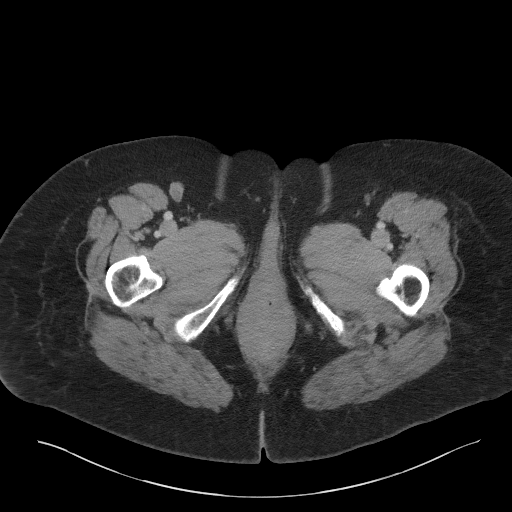
[im 22/98  soft-tissue]
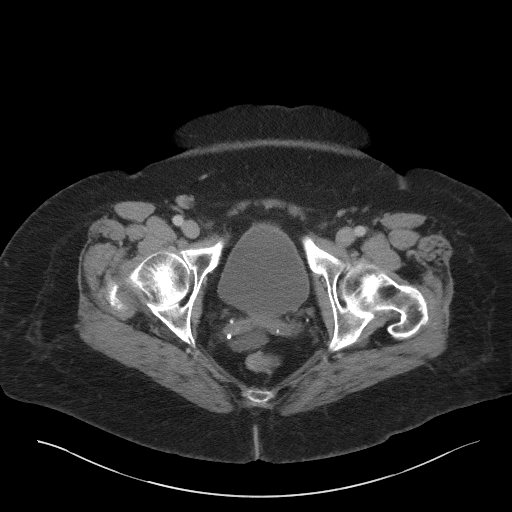
[im 27/98  soft-tissue]
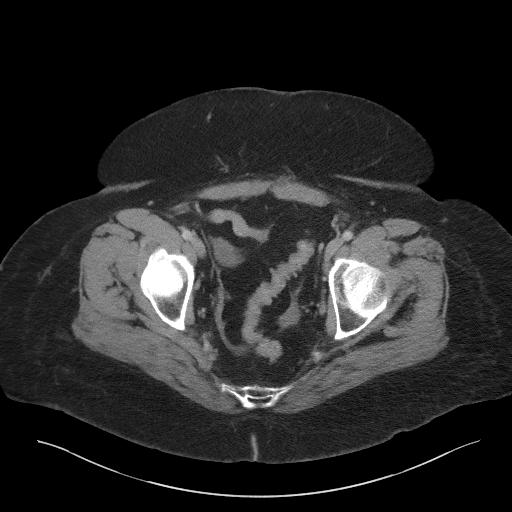
[im 33/98  soft-tissue]
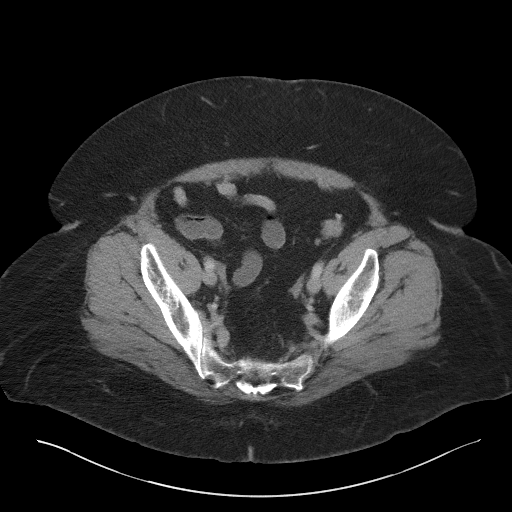
[im 44/98  soft-tissue]
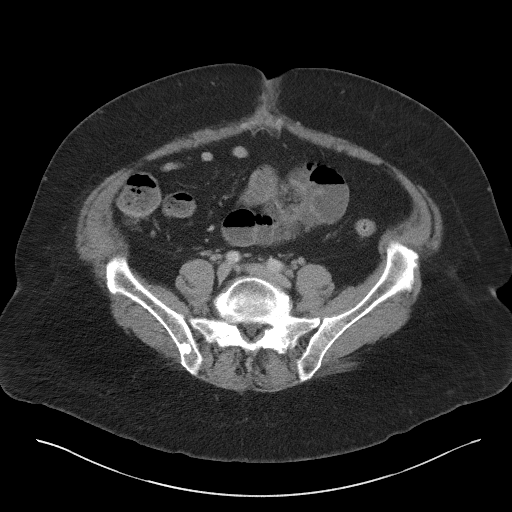
[im 49/98  soft-tissue]
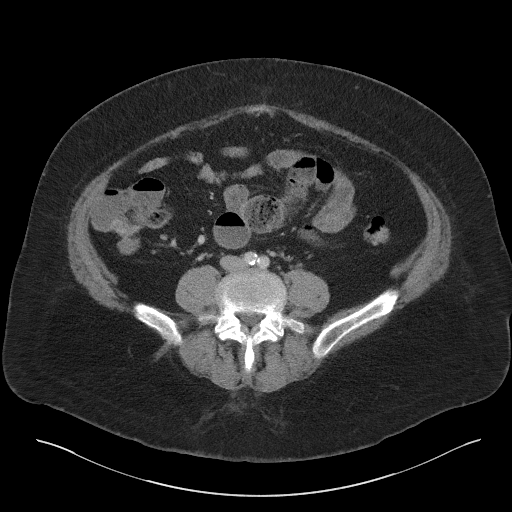
[im 54/98  soft-tissue]
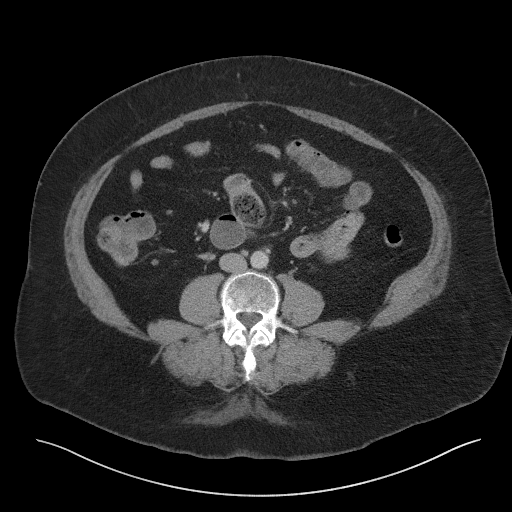
[im 65/98  soft-tissue]
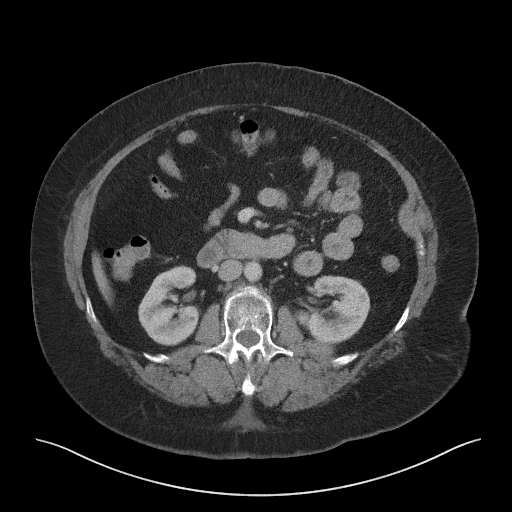
[im 65/98  bone]
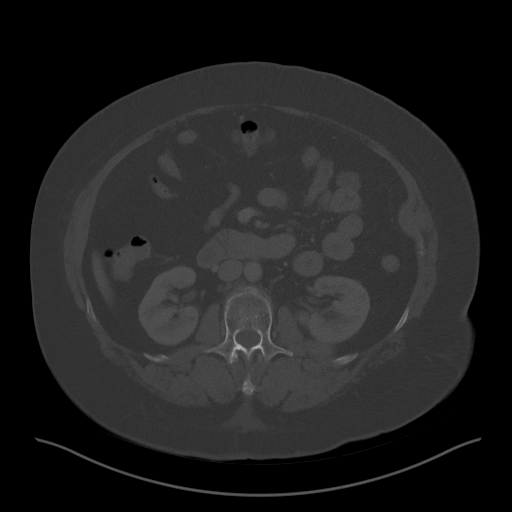
[im 71/98  soft-tissue]
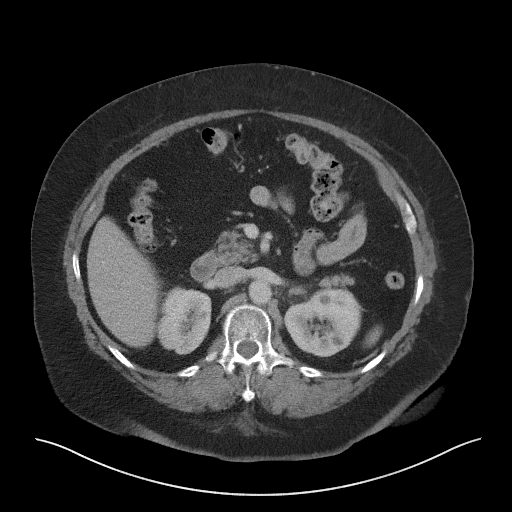
[im 76/98  soft-tissue]
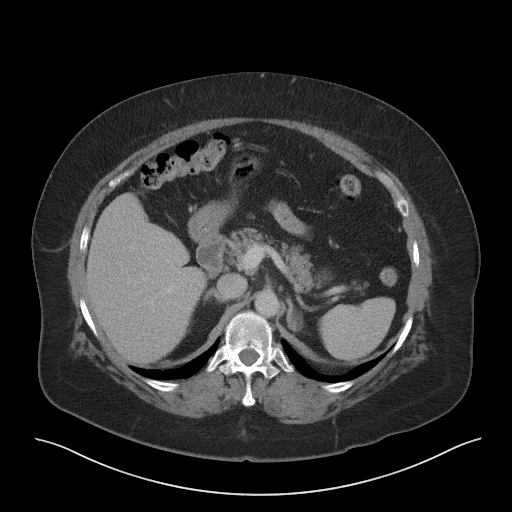
[im 87/98  soft-tissue]
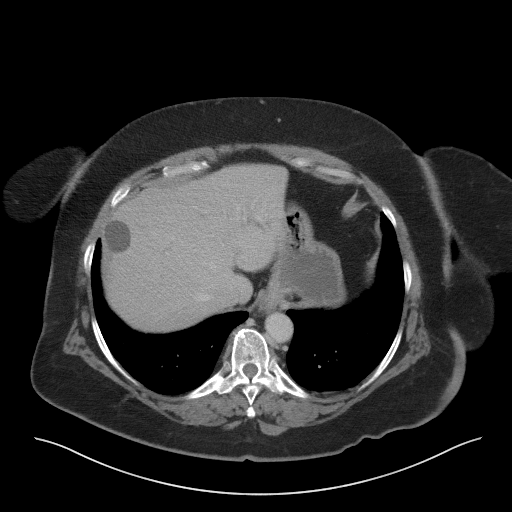
[im 92/98  soft-tissue]
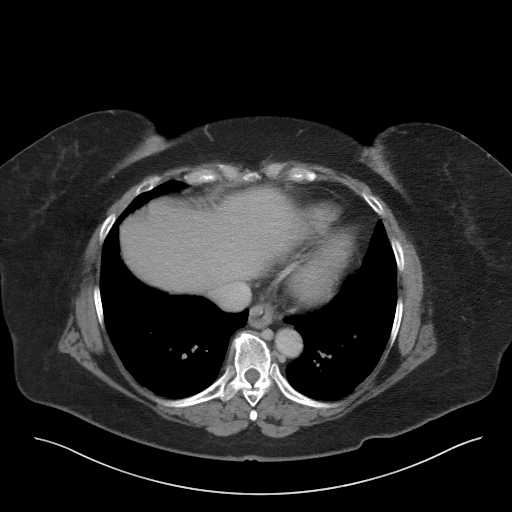

[Series 5: coronal st · coronal · 0.98mm/px · 3 of 120 slices shown]
[im 40/120  soft-tissue]
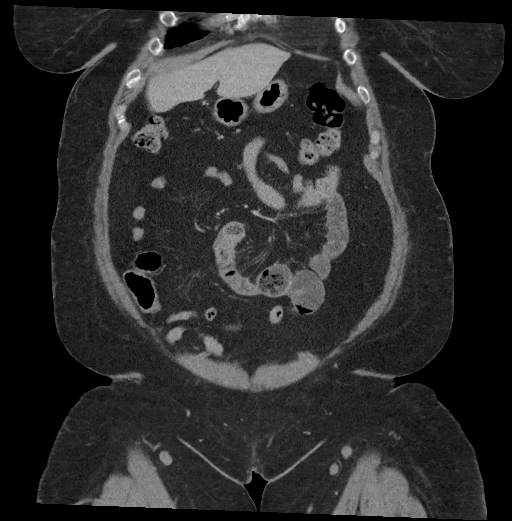
[im 53/120  soft-tissue]
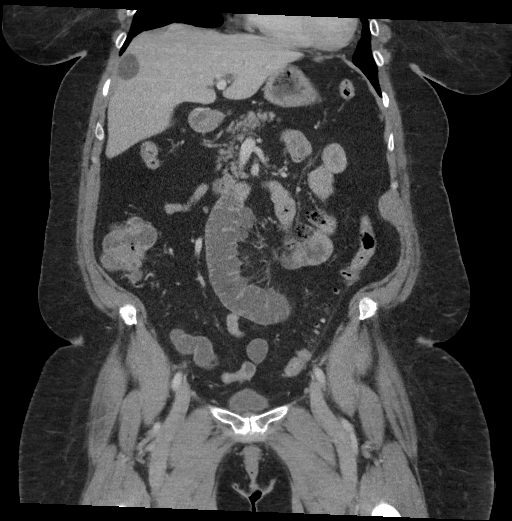
[im 67/120  soft-tissue]
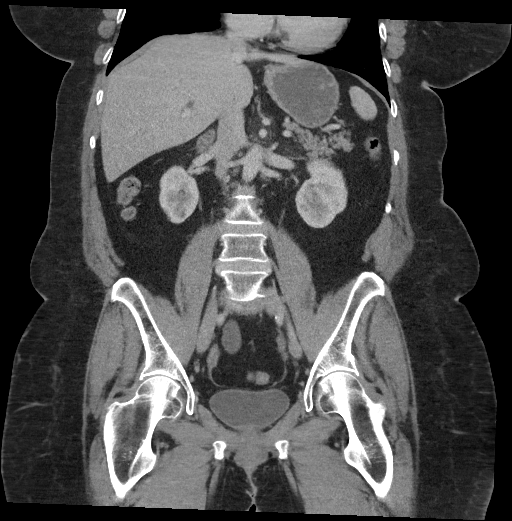

[16 of 46 positions shown; findings below may reference images not displayed]

FINDINGS: Lower chest: Lung bases are clear.

Hepatobiliary: Liver shows a lateral right lobe cysts stable in
appearance from the prior exam. The gallbladder has been surgically
removed. No significant biliary dilatation is noted.

Pancreas: Unremarkable. No pancreatic ductal dilatation or
surrounding inflammatory changes.

Spleen: Normal in size without focal abnormality.

Adrenals/Urinary Tract: Adrenal glands are within normal limits.
Scattered cysts are noted within the kidneys bilaterally. Normal
enhancement is otherwise noted. Normal excretion of contrast is
seen. No renal calculi are seen. Bladder is within normal limits.

Stomach/Bowel: Scattered diverticular change of the colon is noted.
No evidence of diverticulitis is seen. Appendix appears within
normal limits. Stomach is within normal limits. A few loops of mid
to distal jejunum are noted to be dilated and fluid-filled with some
fecalization of bowel contents suspicious for mild small bowel ileus
or possible closed loop obstruction. No definitive transition zones
are noted. These changes could be related to prior adhesions.

Vascular/Lymphatic: Aortic atherosclerosis. No enlarged abdominal or
pelvic lymph nodes.

Reproductive: Status post hysterectomy. No adnexal masses.

Other: No abdominal wall hernia or abnormality. No abdominopelvic
ascites.

Musculoskeletal: Degenerative changes of lumbar spine are noted.
IMPRESSION: Mildly dilated loops of mid to distal jejunum with air-fluid levels
and mild fecalization of bowel contents. Changes are suspicious for
a partial closed loop obstruction/mild ileus and may be related to
underlying adhesions as no definitive transition zone is seen.

Diverticulosis without diverticulitis.

Chronic changes as described above.

## 2022-06-22 DIAGNOSIS — R600 Localized edema: Secondary | ICD-10-CM | POA: Diagnosis not present

## 2022-06-22 DIAGNOSIS — I10 Essential (primary) hypertension: Secondary | ICD-10-CM | POA: Diagnosis not present

## 2022-07-02 DIAGNOSIS — I872 Venous insufficiency (chronic) (peripheral): Secondary | ICD-10-CM | POA: Diagnosis not present

## 2022-07-02 DIAGNOSIS — I82402 Acute embolism and thrombosis of unspecified deep veins of left lower extremity: Secondary | ICD-10-CM | POA: Diagnosis not present

## 2022-07-02 DIAGNOSIS — R600 Localized edema: Secondary | ICD-10-CM | POA: Diagnosis not present

## 2022-07-02 DIAGNOSIS — I87393 Chronic venous hypertension (idiopathic) with other complications of bilateral lower extremity: Secondary | ICD-10-CM | POA: Diagnosis not present

## 2022-07-13 DIAGNOSIS — R000 Tachycardia, unspecified: Secondary | ICD-10-CM | POA: Diagnosis not present

## 2022-07-13 DIAGNOSIS — Z7901 Long term (current) use of anticoagulants: Secondary | ICD-10-CM | POA: Diagnosis not present

## 2022-07-13 DIAGNOSIS — R079 Chest pain, unspecified: Secondary | ICD-10-CM | POA: Diagnosis not present

## 2022-07-13 DIAGNOSIS — Z86718 Personal history of other venous thrombosis and embolism: Secondary | ICD-10-CM | POA: Diagnosis not present

## 2022-07-13 DIAGNOSIS — R0789 Other chest pain: Secondary | ICD-10-CM | POA: Diagnosis not present

## 2022-07-13 DIAGNOSIS — R519 Headache, unspecified: Secondary | ICD-10-CM | POA: Diagnosis not present

## 2022-07-15 ENCOUNTER — Telehealth: Payer: Self-pay | Admitting: Primary Care

## 2022-07-15 NOTE — Telephone Encounter (Signed)
Patient was told to call and update Ann Barker about her going to ER, she said that they found something is going on with her kidneys

## 2022-07-16 NOTE — Telephone Encounter (Signed)
Reviewed ED notes, labs, imaging.  Please schedule patient for follow-up visit in 2 weeks.

## 2022-07-16 NOTE — Telephone Encounter (Signed)
Called patient have set up for follow up in 2 weeks. No further action needed at this time.

## 2022-07-20 DIAGNOSIS — I872 Venous insufficiency (chronic) (peripheral): Secondary | ICD-10-CM | POA: Diagnosis not present

## 2022-07-20 DIAGNOSIS — R252 Cramp and spasm: Secondary | ICD-10-CM | POA: Diagnosis not present

## 2022-07-20 DIAGNOSIS — I87392 Chronic venous hypertension (idiopathic) with other complications of left lower extremity: Secondary | ICD-10-CM | POA: Diagnosis not present

## 2022-07-20 DIAGNOSIS — I82502 Chronic embolism and thrombosis of unspecified deep veins of left lower extremity: Secondary | ICD-10-CM | POA: Diagnosis not present

## 2022-07-20 DIAGNOSIS — R600 Localized edema: Secondary | ICD-10-CM | POA: Diagnosis not present

## 2022-07-30 ENCOUNTER — Ambulatory Visit: Payer: Medicare HMO | Admitting: Primary Care

## 2022-08-04 ENCOUNTER — Ambulatory Visit (INDEPENDENT_AMBULATORY_CARE_PROVIDER_SITE_OTHER): Payer: Medicare HMO | Admitting: Primary Care

## 2022-08-04 ENCOUNTER — Encounter: Payer: Self-pay | Admitting: Primary Care

## 2022-08-04 VITALS — BP 134/76 | HR 74 | Temp 98.6°F | Ht 66.5 in | Wt 256.0 lb

## 2022-08-04 DIAGNOSIS — E2839 Other primary ovarian failure: Secondary | ICD-10-CM

## 2022-08-04 DIAGNOSIS — E65 Localized adiposity: Secondary | ICD-10-CM | POA: Diagnosis not present

## 2022-08-04 DIAGNOSIS — Z1231 Encounter for screening mammogram for malignant neoplasm of breast: Secondary | ICD-10-CM | POA: Diagnosis not present

## 2022-08-04 DIAGNOSIS — R079 Chest pain, unspecified: Secondary | ICD-10-CM

## 2022-08-04 DIAGNOSIS — I82509 Chronic embolism and thrombosis of unspecified deep veins of unspecified lower extremity: Secondary | ICD-10-CM | POA: Insufficient documentation

## 2022-08-04 DIAGNOSIS — I82502 Chronic embolism and thrombosis of unspecified deep veins of left lower extremity: Secondary | ICD-10-CM | POA: Diagnosis not present

## 2022-08-04 DIAGNOSIS — I10 Essential (primary) hypertension: Secondary | ICD-10-CM

## 2022-08-04 LAB — BASIC METABOLIC PANEL
BUN: 14 mg/dL (ref 6–23)
CO2: 29 mEq/L (ref 19–32)
Calcium: 8.9 mg/dL (ref 8.4–10.5)
Chloride: 109 mEq/L (ref 96–112)
Creatinine, Ser: 1.1 mg/dL (ref 0.40–1.20)
GFR: 50.01 mL/min — ABNORMAL LOW (ref >60.00)
Glucose, Bld: 94 mg/dL (ref 70–99)
Potassium: 3.4 mEq/L — ABNORMAL LOW (ref 3.5–5.1)
Sodium: 146 mEq/L — ABNORMAL HIGH (ref 135–145)

## 2022-08-04 NOTE — Progress Notes (Signed)
Subjective:    Patient ID: Ann Barker, female    DOB: [birth year], 71 y.o.   MRN: [id number]  HPI  Ann Barker is a very pleasant 71 y.o. female with a history of chronic venous insufficiency, hypertension, partial small bowel obstruction, DVT, frequent headaches, prediabetes, chronic fatigue, morbid obesity who presents today for ED follow-up. She would also like another referral for her abdominal panus.  She is due for bone density scan and mammogram.   1) Chest Pain:  Evaluated at [facility] on 07/13/2022 for a 1 week history of left-sided chest pain with radiation to left arm and left shoulder.  She also endorsed headaches, increased chest pain with deep breathing.    ED notes reveal a recent diagnosis of DVT with management on Eliquis.  We do not have these records.  During her stay in the ED lab work revealed mild AKI with creatinine of 1.4.  Serial troponins were negative.  She was noted to be tachycardic which resolved after IV fluids.  She underwent CTA chest which was negative for acute pulmonary embolism.  She underwent CT head which was negative for acute intracranial abnormality.  Chest x-ray was negative for acute process.  She was discharged home later that evening with recommendations for PCP follow-up and repeat renal function testing.  She endorses following with [facility] who found a DVT to the left lower extremity about one month ago. She was initiated Eliquis 10 mg BID but she has since discontinued due to symptoms of chest pain, headaches. These symptoms are what prompted her to go to the ED.   Since her ED visit, and after discontinuation of Eliquis, her symptoms have resolved. She is now managed on aspirin 81 mg daily for which she is compliant. She denies chest pain, dizziness, headaches.   She is compliant to 4-5 bottles of water daily.   2) Abdominal Panus: Chronic for years. Referred to plastic surgery numerous times throughout the  last 1 year, last referral was in May 2023. She endorses being told during her last visit that her height was too short and her weight was too high.   She noticed in her chart that she was recorded being 5'4", however, she is 5'6". She would like another referral placed because she feels that she is not too short for the abdominal panus surgery.   BP Readings from Last 3 Encounters:  08/04/22 134/76  03/30/22 126/84  09/24/21 120/82      Review of Systems  Respiratory:  Negative for shortness of breath.   Cardiovascular:  Negative for chest pain.  Neurological:  Negative for dizziness and headaches.         Past Medical History:  Diagnosis Date   Acid reflux    Acute pain of right foot 10/08/2019   Anemia    Arthritis    Cancer ([place]) 1993   SKIN CANCER   Complication of anesthesia    HARD TO WAKE UP   Concussion 01/2017   Dizziness    Family history of adverse reaction to anesthesia    PT UNSURE OF FAMILY HISTORY   Gallstones    Hematuria    microscopic   History of hiatal hernia    History of kidney stones    H/O   HLD (hyperlipidemia)    Hoarseness    Hypertension    Lower extremity edema    pt states this is chronic and has been told she has lymph edema-wears  compression stockings   Lower leg edema    Over weight    PONV (postoperative nausea and vomiting)    Renal cyst    Seizures (HCC)    AS A CHILD AND THEN WITH 1 PREGNANCY BUT NONE SINCE   Sleep apnea    NO CPAP   Symptomatic cholelithiasis 04/14/2018   Urge incontinence    UTI (lower urinary tract infection)    Vitamin D deficiency     Social History   Socioeconomic History   Marital status: Divorced    Spouse name: Not on file   Number of children: 3   Years of education: Not on file   Highest education level: Not on file  Occupational History   Occupation: retired  Tobacco Use   Smoking status: Never   Smokeless tobacco: Never  Vaping Use   Vaping Use: Never used  Substance and Sexual  Activity   Alcohol use: No    Alcohol/week: 0.0 standard drinks of alcohol   Drug use: No   Sexual activity: Not on file  Other Topics Concern   Not on file  Social History Narrative   Her disabled child lives with her   Social Determinants of Health   Financial Resource Strain: Low Risk  (03/02/2022)   Overall Financial Resource Strain (CARDIA)    Difficulty of Paying Living Expenses: Not hard at all  Food Insecurity: No Food Insecurity (03/02/2022)   Hunger Vital Sign    Worried About Running Out of Food in the Last Year: Never true    Ran Out of Food in the Last Year: Never true  Transportation Needs: No Transportation Needs (03/02/2022)   PRAPARE - Hydrologist (Medical): No    Lack of Transportation (Non-Medical): No  Physical Activity: Insufficiently Active (03/02/2022)   Exercise Vital Sign    Days of Exercise per Week: 7 days    Minutes of Exercise per Session: 20 min  Stress: No Stress Concern Present (03/02/2022)   [place]    Feeling of Stress : Not at all  Social Connections: Moderately Integrated (03/02/2022)   Social Connection and Isolation Panel [NHANES]    Frequency of Communication with Friends and Family: More than three times a week    Frequency of Social Gatherings with Friends and Family: More than three times a week    Attends Religious Services: More than 4 times per year    Active Member of Clubs or Organizations: Yes    Attends Archivist Meetings: More than 4 times per year    Marital Status: Divorced  Intimate Partner Violence: Not At Risk (03/02/2022)   Humiliation, Afraid, Rape, and Kick questionnaire    Fear of Current or Ex-Partner: No    Emotionally Abused: No    Physically Abused: No    Sexually Abused: No    Past Surgical History:  Procedure Laterality Date   ABDOMINAL HYSTERECTOMY  1981   BREAST BIOPSY  1995/2005   CHOLECYSTECTOMY N/A  05/10/2018   Procedure: LAPAROSCOPIC CHOLECYSTECTOMY;  Surgeon: Vickie Epley, MD;  Location: [facility] ORS;  Service: General;  Laterality: N/A;   COLONOSCOPY WITH PROPOFOL N/A 02/20/2019   Procedure: COLONOSCOPY WITH PROPOFOL;  Surgeon: Lucilla Lame, MD;  Location: [facility] END[facility]COPY;  Service: Endoscopy;  Laterality: N/A;   HERNIA REPAIR  2005   LAPAROSCOPIC LYSIS OF ADHESIONS N/A 05/10/2018   Procedure: LAPAROSCOPIC LYSIS OF ADHESIONS;  Surgeon: Vickie Epley, MD;  Location: [facility] ORS;  Service: General;  Laterality: N/A;   TUBAL LIGATION  1975    Family History  Problem Relation Age of Onset   Cancer Paternal Aunt    Stroke Paternal Uncle    Prostate cancer Neg Hx    Kidney disease Neg Hx    Bladder Cancer Neg Hx     Allergies  Allergen Reactions   Morphine Other (See Comments)    Unknown   Clindamycin/Lincomycin Diarrhea   Methylprednisolone Swelling   Sulfa Antibiotics    Codeine Palpitations and Other (See Comments)    unknown   Morphine And Related Palpitations   Penicillins Rash and Other (See Comments)    unknown    Current Outpatient Medications on File Prior to Visit  Medication Sig Dispense Refill   aspirin EC 81 MG tablet Take 81 mg by mouth daily. Swallow whole.     cholecalciferol (VITAMIN D) 1000 units tablet Take 1,000 Units by mouth daily.     clobetasol ointment (TEMOVATE) 0.05 % Apply topically.     folic acid (FOLVITE) 962 MCG tablet Take 800 mcg by mouth daily.     hydrochlorothiazide (HYDRODIURIL) 12.5 MG tablet TAKE 1 TABLET (12.5 MG TOTAL) BY MOUTH DAILY FOR BLOOD PRESSURE 90 tablet 1   Multiple Vitamin (MULTIVITAMIN) capsule Take 1 capsule by mouth daily.     olmesartan (BENICAR) 20 MG tablet TAKE 1 TABLET (20 MG TOTAL) BY MOUTH DAILY. FOR BLOOD PRESSURE. 90 tablet 1   No current facility-administered medications on file prior to visit.    BP 134/76   Pulse 74   Temp 98.6 F (37 C) (Oral)   Ht 5' 6.5" (1.689 m)   Wt 256 lb (116.1 kg)   SpO2  96%   BMI 40.70 kg/m  Objective:   Physical Exam Cardiovascular:     Rate and Rhythm: Normal rate and regular rhythm.     Comments: Mild to moderate, chronic, bilateral ankle edema noted on exam. Pulmonary:     Effort: Pulmonary effort is normal.     Breath sounds: Normal breath sounds.  Musculoskeletal:     Cervical back: Neck supple.  Skin:    General: Skin is warm and dry.  Psychiatric:        Mood and Affect: Mood normal.           Assessment & Plan:   Problem List Items Addressed This Visit       Cardiovascular and Mediastinum   Essential hypertension   Relevant Medications   aspirin EC 81 MG tablet   Other Relevant Orders   Basic metabolic panel   Chronic deep vein thrombosis (DVT) (HCC)    Unclear location, discovered by [facility]. Do not see these records in chart.  Continue aspirin 81 mg daily as she cannot tolerate Eliquis.  Following with [facility] .      Relevant Medications   aspirin EC 81 MG tablet     Other   Abdominal pannus    Corrected patient's height. We did discuss that she still may not qualify for surgery given her weight.  She continues to request referral. Referral placed for plastic surgery.      Relevant Orders   Ambulatory referral to Plastic Surgery   Chest pain - Primary    Resolved. Secondary to side effects from Eliquis.  Reviewed ED notes, labs, imaging from [facility] through [facility]. Repeat renal function testing pending.  Remain off Eliquis Continue aspirin  81 mg daily.       Other Visit Diagnoses     Estrogen deficiency       Relevant Orders   DG Bone Density   Encounter for screening mammogram for malignant neoplasm of breast       Relevant Orders   MM 3D SCREEN BREAST BILATERAL          Pleas Koch, NP

## 2022-08-04 NOTE — Assessment & Plan Note (Signed)
Unclear location, discovered by [facility]. Do not see these records in chart.  Continue aspirin 81 mg daily as she cannot tolerate Eliquis.  Following with [facility] .

## 2022-08-04 NOTE — Assessment & Plan Note (Signed)
Corrected patient's height. We did discuss that she still may not qualify for surgery given her weight.  She continues to request referral. Referral placed for plastic surgery.

## 2022-08-04 NOTE — Patient Instructions (Addendum)
You will be contacted regarding your referral to plastic surgery.  Please let us know if you have not been contacted within two weeks.   Stop by the lab prior to leaving today. I will notify you of your results once received.   Call the [facility] to schedule your mammogram and bone density scan.   '[]'$   2D Mammogram  '[x]'$   3D Mammogram  '[x]'$   Bone Density     '[x]'$   [facility]      [place], [place]          Make sure to wear two piece clothing  No lotions powders or deodorants the day of the appointment Make sure to bring picture ID and insurance card.  Bring list of medications you are currently taking including any supplements.

## 2022-08-04 NOTE — Assessment & Plan Note (Addendum)
Resolved. Secondary to side effects from Eliquis.  Reviewed ED notes, labs, imaging from [facility] through [facility]. Repeat renal function testing pending.  Remain off Eliquis Continue aspirin 81 mg daily.

## 2022-08-06 ENCOUNTER — Telehealth: Payer: Self-pay

## 2022-08-06 NOTE — Telephone Encounter (Addendum)
I believe she is referring to lymphedema, and I have not heard about earrings that will help.  She can certainly call her vein/vascular doctor.  Please also notify patient that we received a response back from the plastic surgeon to [facility] and they will not accept her as she is not a candidate for the surgery.  She has been rejected several times by multiple plastic surgeons so I will not be referring further until she has confirmed the name of someone who will accept her case.  She will have to do some searching on her own.  I will send her rejection letter for scanning into her chart.

## 2022-08-06 NOTE — Telephone Encounter (Signed)
Patient states that she was told she has lipidemia, and looked online and seen that there are earrings that help with it. Wondering if Anda Kraft has heard of them and if she would recommend them.

## 2022-08-06 NOTE — Telephone Encounter (Signed)
forwarding

## 2022-08-09 NOTE — Telephone Encounter (Signed)
Called patient reviewed all information and repeated back to me. Will call if any questions.  She has appointment with vascular next week will talk to them about options at that time.  She has spoke to the coordinator at [facility] and will reach back out to them when BMI is in range.  No further action needed at this time.

## 2022-08-17 ENCOUNTER — Other Ambulatory Visit: Payer: Self-pay | Admitting: Primary Care

## 2022-08-17 DIAGNOSIS — I10 Essential (primary) hypertension: Secondary | ICD-10-CM

## 2022-08-23 DIAGNOSIS — I83892 Varicose veins of left lower extremities with other complications: Secondary | ICD-10-CM | POA: Diagnosis not present

## 2022-08-23 DIAGNOSIS — R252 Cramp and spasm: Secondary | ICD-10-CM | POA: Diagnosis not present

## 2022-08-23 DIAGNOSIS — R600 Localized edema: Secondary | ICD-10-CM | POA: Diagnosis not present

## 2022-09-03 ENCOUNTER — Telehealth: Payer: Self-pay | Admitting: Primary Care

## 2022-09-03 DIAGNOSIS — I10 Essential (primary) hypertension: Secondary | ICD-10-CM

## 2022-09-03 MED ORDER — OLMESARTAN MEDOXOMIL 20 MG PO TABS: 20.0000 mg | ORAL_TABLET | Freq: Every day | ORAL | 1 refills | Status: DC

## 2022-09-03 NOTE — Telephone Encounter (Signed)
Refills sent to pharmacy. 

## 2022-09-03 NOTE — Telephone Encounter (Signed)
  Encourage patient to contact the pharmacy for refills or they can request refills through [facility]  Did the patient contact the pharmacy: n    LAST APPOINTMENT DATE:  Please schedule appointment if longer than 1 year  NEXT APPOINTMENT DATE:  MEDICATION:olmesartan (BENICAR) 20 MG tablet  Is the patient out of medication? y  If not, how much is left?  Is this a 80 day supply: y  PHARMACY: [id number] [place] [place] [address]:  [phone] Fax:  [phone]     Let patient know to contact pharmacy at the end of the day to make sure medication is ready.  Please notify patient to allow 48-72 hours to process

## 2022-12-24 ENCOUNTER — Ambulatory Visit (INDEPENDENT_AMBULATORY_CARE_PROVIDER_SITE_OTHER): Payer: Medicare HMO | Admitting: Primary Care

## 2022-12-24 ENCOUNTER — Encounter: Payer: Self-pay | Admitting: Primary Care

## 2022-12-24 VITALS — BP 160/98 | HR 76 | Temp 98.1°F | Ht 65.25 in | Wt 252.0 lb

## 2022-12-24 DIAGNOSIS — L659 Nonscarring hair loss, unspecified: Secondary | ICD-10-CM

## 2022-12-24 LAB — IBC + FERRITIN
Ferritin: 145.9 ng/mL (ref 10.0–291.0)
Iron: 45 ug/dL (ref 42–145)
Saturation Ratios: 12.7 % — ABNORMAL LOW (ref 20.0–50.0)
TIBC: 355.6 ug/dL (ref 250.0–450.0)
Transferrin: 254 mg/dL (ref 212.0–360.0)

## 2022-12-24 LAB — CBC WITH DIFFERENTIAL/PLATELET
Basophils Absolute: 0.1 10*3/uL (ref 0.0–0.1)
Basophils Relative: 1.3 % (ref 0.0–3.0)
Eosinophils Absolute: 0.3 10*3/uL (ref 0.0–0.7)
Eosinophils Relative: 2.6 % (ref 0.0–5.0)
HCT: 36.8 % (ref 36.0–46.0)
Hemoglobin: 11.9 g/dL — ABNORMAL LOW (ref 12.0–15.0)
Lymphocytes Relative: 29.9 % (ref 12.0–46.0)
Lymphs Abs: 2.9 10*3/uL (ref 0.7–4.0)
MCHC: 32.4 g/dL (ref 30.0–36.0)
MCV: 88.5 fl (ref 78.0–100.0)
Monocytes Absolute: 0.9 10*3/uL (ref 0.1–1.0)
Monocytes Relative: 9 % (ref 3.0–12.0)
Neutro Abs: 5.5 10*3/uL (ref 1.4–7.7)
Neutrophils Relative %: 57.2 % (ref 43.0–77.0)
Platelets: 228 10*3/uL (ref 150.0–400.0)
RBC: 4.16 Mil/uL (ref 3.87–5.11)
RDW: 14.4 % (ref 11.5–15.5)
WBC: 9.6 10*3/uL (ref 4.0–10.5)

## 2022-12-24 LAB — TSH: TSH: 2.94 u[IU]/mL (ref 0.35–5.50)

## 2022-12-24 LAB — HEMOGLOBIN A1C: Hgb A1c MFr Bld: 5.8 % (ref 4.6–6.5)

## 2022-12-24 NOTE — Assessment & Plan Note (Signed)
Chronic.  Checking labs today including CBC, RPR, TSH, Free T4, iron studies today to rule out other cause.  No infection.

## 2022-12-24 NOTE — Patient Instructions (Signed)
Stop by the lab prior to leaving today. I will notify you of your results once received.   It was a pleasure to see you today!  

## 2022-12-24 NOTE — Progress Notes (Signed)
Subjective:    Patient ID: Ann Barker, female    DOB: 01-31-1951, 72 y.o.   MRN: [id number]  HPI  Ann Barker is a very pleasant 72 y.o. female  has a past medical history of Acid reflux, Acute pain of right foot (10/08/2019), Anemia, Arthritis, Cancer ([place]) (2725), Complication of anesthesia, Concussion (01/2017), Dizziness, Family history of adverse reaction to anesthesia, Gallstones, Hematuria, History of hiatal hernia, History of kidney stones, HLD (hyperlipidemia), Hoarseness, Hypertension, Lower extremity edema, Lower leg edema, Over weight, PONV (postoperative nausea and vomiting), Renal cyst, Seizures ([place]), Sleep apnea, Symptomatic cholelithiasis (04/14/2018), Urge incontinence, UTI (lower urinary tract infection), and Vitamin D deficiency. who presents today to discuss skin masses.  One month ago she noticed three "knots" to her parietal scalp. Two knots to left parietal scalp and one knot to the left parietal scalp. A few days later the knots disappeared.   She is seeking treatment from a hair specialist place for hair regrowth and was told "to come to my primary for some lab work".   Previously following with dermatology for hair regrowth, has not seen dermatology for sometime. Was managed on clobetasol for "the knots" as they've been intermittent for years.   Today she denies knots present to her scalp, pain to her scalp. She has chronic hair loss over the years.   Review of Systems  Constitutional:  Negative for fever.  Cardiovascular:  Negative for palpitations.  Skin:  Negative for rash.       Chronic hair loss         Past Medical History:  Diagnosis Date   Acid reflux    Acute pain of right foot 10/08/2019   Anemia    Arthritis    Cancer ([place]) 1993   SKIN CANCER   Complication of anesthesia    HARD TO WAKE UP   Concussion 01/2017   Dizziness    Family history of adverse reaction to anesthesia    PT UNSURE OF FAMILY HISTORY   Gallstones    Hematuria     microscopic   History of hiatal hernia    History of kidney stones    H/O   HLD (hyperlipidemia)    Hoarseness    Hypertension    Lower extremity edema    pt states this is chronic and has been told she has lymph edema-wears compression stockings   Lower leg edema    Over weight    PONV (postoperative nausea and vomiting)    Renal cyst    Seizures (HCC)    AS A CHILD AND THEN WITH 1 PREGNANCY BUT NONE SINCE   Sleep apnea    NO CPAP   Symptomatic cholelithiasis 04/14/2018   Urge incontinence    UTI (lower urinary tract infection)    Vitamin D deficiency     Social History   Socioeconomic History   Marital status: Divorced    Spouse name: Not on file   Number of children: 3   Years of education: Not on file   Highest education level: Not on file  Occupational History   Occupation: retired  Tobacco Use   Smoking status: Never   Smokeless tobacco: Never  Vaping Use   Vaping Use: Never used  Substance and Sexual Activity   Alcohol use: No    Alcohol/week: 0.0 standard drinks of alcohol   Drug use: No   Sexual activity: Not on file  Other Topics Concern   Not on file  Social History  Narrative   Her disabled child lives with her   Social Determinants of Health   Financial Resource Strain: Low Risk  (03/02/2022)   Overall Financial Resource Strain (CARDIA)    Difficulty of Paying Living Expenses: Not hard at all  Food Insecurity: No Food Insecurity (03/02/2022)   Hunger Vital Sign    Worried About Running Out of Food in the Last Year: Never true    Ran Out of Food in the Last Year: Never true  Transportation Needs: No Transportation Needs (03/02/2022)   PRAPARE - Hydrologist (Medical): No    Lack of Transportation (Non-Medical): No  Physical Activity: Insufficiently Active (03/02/2022)   Exercise Vital Sign    Days of Exercise per Week: 7 days    Minutes of Exercise per Session: 20 min  Stress: No Stress Concern Present (03/02/2022)    [place]    Feeling of Stress : Not at all  Social Connections: Moderately Integrated (03/02/2022)   Social Connection and Isolation Panel [NHANES]    Frequency of Communication with Friends and Family: More than three times a week    Frequency of Social Gatherings with Friends and Family: More than three times a week    Attends Religious Services: More than 4 times per year    Active Member of Clubs or Organizations: Yes    Attends Archivist Meetings: More than 4 times per year    Marital Status: Divorced  Intimate Partner Violence: Not At Risk (03/02/2022)   Humiliation, Afraid, Rape, and Kick questionnaire    Fear of Current or Ex-Partner: No    Emotionally Abused: No    Physically Abused: No    Sexually Abused: No    Past Surgical History:  Procedure Laterality Date   ABDOMINAL HYSTERECTOMY  1981   BREAST BIOPSY  1995/2005   CHOLECYSTECTOMY N/A 05/10/2018   Procedure: LAPAROSCOPIC CHOLECYSTECTOMY;  Surgeon: Vickie Epley, MD;  Location: [facility];  Service: General;  Laterality: N/A;   COLONOSCOPY WITH PROPOFOL N/A 02/20/2019   Procedure: COLONOSCOPY WITH PROPOFOL;  Surgeon: Lucilla Lame, MD;  Location: [facility];  Service: Endoscopy;  Laterality: N/A;   HERNIA REPAIR  2005   LAPAROSCOPIC LYSIS OF ADHESIONS N/A 05/10/2018   Procedure: LAPAROSCOPIC LYSIS OF ADHESIONS;  Surgeon: Vickie Epley, MD;  Location: [facility];  Service: General;  Laterality: N/A;   TUBAL LIGATION  1975    Family History  Problem Relation Age of Onset   Cancer Paternal Aunt    Stroke Paternal Uncle    Prostate cancer Neg Hx    Kidney disease Neg Hx    Bladder Cancer Neg Hx     Allergies  Allergen Reactions   Morphine Other (See Comments)    Unknown   Clindamycin/Lincomycin Diarrhea   Methylprednisolone Swelling   Sulfa Antibiotics    Codeine Palpitations and Other (See Comments)    unknown   Morphine And  Related Palpitations   Penicillins Rash and Other (See Comments)    unknown    Current Outpatient Medications on File Prior to Visit  Medication Sig Dispense Refill   aspirin EC 81 MG tablet Take 81 mg by mouth daily. Swallow whole.     cholecalciferol (VITAMIN D) 1000 units tablet Take 1,000 Units by mouth daily.     clobetasol ointment (TEMOVATE) 0.05 % Apply topically.     folic acid (FOLVITE) 629 MCG tablet Take 800 mcg by mouth  daily.     hydrochlorothiazide (HYDRODIURIL) 12.5 MG tablet TAKE 1 TABLET (12.5 MG TOTAL) BY MOUTH DAILY FOR BLOOD PRESSURE 90 tablet 1   Multiple Vitamin (MULTIVITAMIN) capsule Take 1 capsule by mouth daily.     olmesartan (BENICAR) 20 MG tablet Take 1 tablet (20 mg total) by mouth daily. For blood pressure. 90 tablet 1   No current facility-administered medications on file prior to visit.    BP (!) 160/98   Pulse 76   Temp 98.1 F (36.7 C) (Temporal)   Ht 5' 5.25" (1.657 m)   Wt 252 lb (114.3 kg)   SpO2 99%   BMI 41.61 kg/m  Objective:   Physical Exam Cardiovascular:     Rate and Rhythm: Normal rate.  Pulmonary:     Effort: Pulmonary effort is normal.  Skin:    General: Skin is warm and dry.     Findings: No erythema.     Comments: No masses or lumps noted to scalp.  Balding noted to mostly parietal scalp with patchy loss through occipital and temporal regions.   No scaling or skin breakdown noted.  Neurological:     Mental Status: She is alert.           Assessment & Plan:  Alopecia Assessment & Plan: Chronic.  Checking labs today including CBC, RPR, TSH, Free T4, iron studies today to rule out other cause.  No infection.   Orders: -     Hemoglobin A1c -     TSH -     CBC with Differential/Platelet -     IBC + Ferritin -     RPR        Pleas Koch, NP

## 2022-12-28 DIAGNOSIS — R600 Localized edema: Secondary | ICD-10-CM | POA: Diagnosis not present

## 2022-12-28 DIAGNOSIS — I890 Lymphedema, not elsewhere classified: Secondary | ICD-10-CM | POA: Diagnosis not present

## 2022-12-28 DIAGNOSIS — I872 Venous insufficiency (chronic) (peripheral): Secondary | ICD-10-CM | POA: Diagnosis not present

## 2022-12-28 DIAGNOSIS — R252 Cramp and spasm: Secondary | ICD-10-CM | POA: Diagnosis not present

## 2022-12-28 DIAGNOSIS — I83892 Varicose veins of left lower extremities with other complications: Secondary | ICD-10-CM | POA: Diagnosis not present

## 2022-12-28 LAB — RPR: RPR Ser Ql: NONREACTIVE

## 2022-12-28 LAB — VITAMIN B12: Vitamin B-12: 671 pg/mL (ref 200–1100)

## 2022-12-28 LAB — TEST AUTHORIZATION

## 2022-12-28 LAB — VITAMIN D 25 HYDROXY (VIT D DEFICIENCY, FRACTURES): Vit D, 25-Hydroxy: 46 ng/mL (ref 30–100)

## 2022-12-31 ENCOUNTER — Telehealth: Payer: Self-pay | Admitting: Primary Care

## 2022-12-31 NOTE — Telephone Encounter (Signed)
Called and notified patient letter is ready to be picked up Monday 01/03/23

## 2022-12-31 NOTE — Telephone Encounter (Signed)
Noted, letter will be placed in [place] by end of day on 12/31/22.

## 2022-12-31 NOTE — Telephone Encounter (Signed)
Called and spoke to patient she states she needs a letter stating that all her blood work was fine and that she is able to have her scalp worked on. She said that this was discussed during her office visit with [place]. She's requesting to pick up the letter on Monday 01/03/23. Please advise

## 2022-12-31 NOTE — Telephone Encounter (Signed)
Patient called and stated she haven't heard back about her labs for her hair. Call back number [phone]

## 2023-01-04 NOTE — Telephone Encounter (Signed)
[facility] Night - Client Nonclinical Telephone Record  [facility] Client [facility] Night - Client Client Site [facility] - Night Provider [name] - NP Contact Type Call Who Is Calling Patient / Member / Family / Caregiver Caller Name Zyaire Dumas Caller Phone Number [phone] Patient Name Ann Barker Patient DOB 11/16/1951 Call Type Message Only Information Provided Reason for Call Request for General Office Information Initial Comment Caller states she needs to pick up some paperwork. She is almost there. She needs it for a doctor's appt in the morning. Disp. Time Disposition Final User 01/03/2023 5:04:04 PM General Information Provided Yes Maureen Ralphs Call Closed By: Maureen Ralphs Transaction Date/Time: 01/03/2023 5:01:04 PM (ET

## 2023-01-04 NOTE — Telephone Encounter (Signed)
I spoke with pt and she will come and pick up letter this morning at front desk. Nothing further needed at this time and pt appreciative for call and letter.

## 2023-04-26 DIAGNOSIS — R252 Cramp and spasm: Secondary | ICD-10-CM | POA: Diagnosis not present

## 2023-04-26 DIAGNOSIS — I83892 Varicose veins of left lower extremities with other complications: Secondary | ICD-10-CM | POA: Diagnosis not present

## 2023-04-26 DIAGNOSIS — I872 Venous insufficiency (chronic) (peripheral): Secondary | ICD-10-CM | POA: Diagnosis not present

## 2023-04-26 DIAGNOSIS — R600 Localized edema: Secondary | ICD-10-CM | POA: Diagnosis not present

## 2023-06-15 ENCOUNTER — Telehealth: Payer: Self-pay

## 2023-06-15 NOTE — Patient Outreach (Signed)
  Care Coordination   06/15/2023 Name: Ann Barker MRN: [id number] DOB: [birth year]   Care Coordination Outreach Attempts:  An unsuccessful telephone outreach was attempted today to offer the patient information about available care coordination services. Unable to leave voice message due to mailbox being full.  Follow Up Plan:  Additional outreach attempts will be made to offer the patient care coordination information and services.   Encounter Outcome:  No Answer   Care Coordination Interventions:  No, not indicated    [facility] Care Coordination [phone] direct line

## 2023-06-22 DIAGNOSIS — I82402 Acute embolism and thrombosis of unspecified deep veins of left lower extremity: Secondary | ICD-10-CM | POA: Diagnosis not present

## 2023-06-22 DIAGNOSIS — I83892 Varicose veins of left lower extremities with other complications: Secondary | ICD-10-CM | POA: Diagnosis not present

## 2023-07-20 ENCOUNTER — Ambulatory Visit (INDEPENDENT_AMBULATORY_CARE_PROVIDER_SITE_OTHER): Payer: Medicare HMO

## 2023-07-20 VITALS — Ht 66.0 in | Wt 245.0 lb

## 2023-07-20 DIAGNOSIS — Z0000 Encounter for general adult medical examination without abnormal findings: Secondary | ICD-10-CM

## 2023-07-20 DIAGNOSIS — Z23 Encounter for immunization: Secondary | ICD-10-CM

## 2023-07-20 NOTE — Progress Notes (Signed)
Subjective:   Ann Barker is a 72 y.o. female who presents for Medicare Annual (Subsequent) preventive examination.  Visit Complete: Virtual  I connected with  Ann Barker on 07/20/23 by a audio enabled telemedicine application and verified that I am speaking with the correct person using two identifiers.  Patient Location: Home  Provider Location: Office/Clinic  I discussed the limitations of evaluation and management by telemedicine. The patient expressed understanding and agreed to proceed.  Vital Signs: Unable to obtain new vitals due to this being a telehealth visit.   Review of Systems      Cardiac Risk Factors include: advanced age (>71men, >72 women);hypertension;dyslipidemia;sedentary lifestyle;obesity (BMI >30kg/m2)     Objective:    Today's Vitals   07/20/23 1119  Weight: 245 lb (111.1 kg)  Height: 5\' 6"  (1.676 m)   Body mass index is 39.54 kg/m.     07/20/2023   11:24 AM 03/02/2022    2:57 PM 07/22/2020    6:16 PM 07/03/2020    7:50 PM 07/03/2020    6:33 AM 03/19/2020    2:08 PM 09/30/2019    8:37 PM  Advanced Directives  Does Patient Have a Medical Advance Directive? No No No No No No No  Does patient want to make changes to medical advance directive?      No - Patient declined   Would patient like information on creating a medical advance directive? No - Patient declined No - Patient declined No - Patient declined No - Patient declined No - Patient declined      Current Medications (verified) Outpatient Encounter Medications as of 07/20/2023  Medication Sig   cholecalciferol (VITAMIN D) 1000 units tablet Take 1,000 Units by mouth daily.   clobetasol ointment (TEMOVATE) 0.05 % Apply topically.   folic acid (FOLVITE) 800 MCG tablet Take 800 mcg by mouth daily.   Multiple Vitamin (MULTIVITAMIN) capsule Take 1 capsule by mouth daily.   olmesartan (BENICAR) 20 MG tablet Take 1 tablet (20 mg total) by mouth daily. For blood pressure.   rivaroxaban  (XARELTO) 20 MG TABS tablet Take 20 mg by mouth daily with supper.   aspirin EC 81 MG tablet Take 81 mg by mouth daily. Swallow whole. (Patient not taking: Reported on 07/20/2023)   hydrochlorothiazide (HYDRODIURIL) 12.5 MG tablet TAKE 1 TABLET (12.5 MG TOTAL) BY MOUTH DAILY FOR BLOOD PRESSURE (Patient not taking: Reported on 07/20/2023)   No facility-administered encounter medications on file as of 07/20/2023.    Allergies (verified) Morphine, Clindamycin/lincomycin, Methylprednisolone, Sulfa antibiotics, Codeine, Morphine and codeine, and Penicillins   History: Past Medical History:  Diagnosis Date   Acid reflux    Acute pain of right foot 10/08/2019   Anemia    Arthritis    Cancer (HCC) 1993   SKIN CANCER   Complication of anesthesia    HARD TO WAKE UP   Concussion 01/2017   Dizziness    Family history of adverse reaction to anesthesia    PT UNSURE OF FAMILY HISTORY   Gallstones    Hematuria    microscopic   History of hiatal hernia    History of kidney stones    H/O   HLD (hyperlipidemia)    Hoarseness    Hypertension    Lower extremity edema    pt states this is chronic and has been told she has lymph edema-wears compression stockings   Lower leg edema    Over weight    PONV (postoperative nausea and vomiting)  Renal cyst    Seizures (HCC)    AS A CHILD AND THEN WITH 1 PREGNANCY BUT NONE SINCE   Sleep apnea    NO CPAP   Symptomatic cholelithiasis 04/14/2018   Urge incontinence    UTI (lower urinary tract infection)    Vitamin D deficiency    Past Surgical History:  Procedure Laterality Date   ABDOMINAL HYSTERECTOMY  1981   BREAST BIOPSY  1995/2005   CHOLECYSTECTOMY N/A 05/10/2018   Procedure: LAPAROSCOPIC CHOLECYSTECTOMY;  Surgeon: Ancil Linsey, MD;  Location: [facility];  Service: General;  Laterality: N/A;   COLONOSCOPY WITH PROPOFOL N/A 02/20/2019   Procedure: COLONOSCOPY WITH PROPOFOL;  Surgeon: Midge Minium, MD;  Location: [facility];  Service:  Endoscopy;  Laterality: N/A;   HERNIA REPAIR  2005   LAPAROSCOPIC LYSIS OF ADHESIONS N/A 05/10/2018   Procedure: LAPAROSCOPIC LYSIS OF ADHESIONS;  Surgeon: Ancil Linsey, MD;  Location: [facility];  Service: General;  Laterality: N/A;   TUBAL LIGATION  1975   Family History  Problem Relation Age of Onset   Cancer Paternal Aunt    Stroke Paternal Uncle    Prostate cancer Neg Hx    Kidney disease Neg Hx    Bladder Cancer Neg Hx    Social History   Socioeconomic History   Marital status: Divorced    Spouse name: Not on file   Number of children: 3   Years of education: Not on file   Highest education level: Not on file  Occupational History   Occupation: retired  Tobacco Use   Smoking status: Never   Smokeless tobacco: Never  Vaping Use   Vaping status: Never Used  Substance and Sexual Activity   Alcohol use: No    Alcohol/week: 0.0 standard drinks of alcohol   Drug use: No   Sexual activity: Not on file  Other Topics Concern   Not on file  Social History Narrative   Her disabled child lives with her   Social Determinants of Health   Financial Resource Strain: Low Risk  (07/20/2023)   Overall Financial Resource Strain (CARDIA)    Difficulty of Paying Living Expenses: Not hard at all  Food Insecurity: No Food Insecurity (07/20/2023)   Hunger Vital Sign    Worried About Running Out of Food in the Last Year: Never true    Ran Out of Food in the Last Year: Never true  Transportation Needs: No Transportation Needs (07/20/2023)   PRAPARE - Administrator, Civil Service (Medical): No    Lack of Transportation (Non-Medical): No  Physical Activity: Inactive (07/20/2023)   Exercise Vital Sign    Days of Exercise per Week: 0 days    Minutes of Exercise per Session: 0 min  Stress: No Stress Concern Present (07/20/2023)   [employer] of Occupational Health - Occupational Stress Questionnaire    Feeling of Stress : Not at all  Social Connections: Moderately Isolated  (07/20/2023)   Social Connection and Isolation Panel [NHANES]    Frequency of Communication with Friends and Family: More than three times a week    Frequency of Social Gatherings with Friends and Family: More than three times a week    Attends Religious Services: More than 4 times per year    Active Member of [employer] or Organizations: No    Attends Banker Meetings: Never    Marital Status: Divorced    Tobacco Counseling Counseling given: Not Answered   Clinical Intake:  Pre-visit  preparation completed: Yes  Pain : No/denies pain     BMI - recorded: 39.54 Nutritional Status: BMI > 30  Obese Nutritional Risks: None Diabetes: No  How often do you need to have someone help you when you read instructions, pamphlets, or other written materials from your doctor or pharmacy?: 1 - Never  Interpreter Needed?: No  Information entered by :: C.Kyomi Hector LPN   Activities of Daily Living    07/20/2023   11:33 AM  In your present state of health, do you have any difficulty performing the following activities:  Hearing? 0  Vision? 0  Difficulty concentrating or making decisions? 0  Walking or climbing stairs? 0  Dressing or bathing? 0  Doing errands, shopping? 0  Preparing Food and eating ? N  Using the Toilet? N  In the past six months, have you accidently leaked urine? Y  Comment occasionally  Do you have problems with loss of bowel control? N  Managing your Medications? N  Managing your Finances? N  Housekeeping or managing your Housekeeping? N    Patient Care Team: Doreene Nest, NP as PCP - General (Nurse Practitioner) Basilio Cairo, MD (Dermatology)  Indicate any recent Medical Services you may have received from other than [facility] providers in the past year (date may be approximate).     Assessment:   This is a routine wellness examination for Tarita.  Hearing/Vision screen Hearing Screening - Comments:: Denies hearing difficulties   Vision  Screening - Comments:: Readers - Unknown Provider - Pt declines eye exam.  Dietary issues and exercise activities discussed:     Goals Addressed             This Visit's Progress    Patient Stated       Eat right and stay healthy       Depression Screen    07/20/2023   11:29 AM 03/02/2022    2:56 PM 03/19/2020    2:32 PM 01/18/2019   12:04 PM 01/09/2019   12:38 PM 04/08/2017    1:30 PM  PHQ 2/9 Scores  PHQ - 2 Score 0 0 0 1 0 0  PHQ- 9 Score   0 7 0     Fall Risk    07/20/2023   11:33 AM 07/20/2023   11:25 AM 03/02/2022    2:52 PM 06/02/2021   12:40 PM 03/19/2020    2:31 PM  Fall Risk   Falls in the past year? 0 0 0 0 0  Number falls in past yr: 0 0 0 0 0  Injury with Fall? 0 0 0 0 0  Risk for fall due to : No Fall Risks No Fall Risks No Fall Risks  Medication side effect  Follow up Falls prevention discussed Falls prevention discussed Falls prevention discussed  Falls evaluation completed;Falls prevention discussed    MEDICARE RISK AT HOME:  Medicare Risk at Home - 07/20/23 1134     Any stairs in or around the home? No    If so, are there any without handrails? No    Home free of loose throw rugs in walkways, pet beds, electrical cords, etc? Yes    Adequate lighting in your home to reduce risk of falls? Yes    Life alert? No    Use of a cane, walker or w/c? No    Grab bars in the bathroom? Yes    Shower chair or bench in shower? Yes    Elevated toilet seat or a  handicapped toilet? No             TIMED UP AND GO:  Was the test performed?  No    Cognitive Function:    03/19/2020    2:34 PM 01/09/2019   12:39 PM 04/08/2017    1:40 PM  MMSE - Mini Mental State Exam  Orientation to time 5 5 5   Orientation to Place 5 5 5   Registration 3 3 3   Attention/ Calculation 5 0 0  Recall 3 3 3   Language- name 2 objects  0 0  Language- repeat 1 1 1   Language- follow 3 step command  3 3  Language- read & follow direction  0 0  Write a sentence  0 0  Copy design  0 0   Total score  20 20        07/20/2023   11:35 AM 03/02/2022    3:01 PM  6CIT Screen  What Year? 0 points 0 points  What month? 0 points 0 points  What time? 0 points 0 points  Count back from 20 0 points 0 points  Months in reverse 0 points 0 points  Repeat phrase 0 points 2 points  Total Score 0 points 2 points    Immunizations Immunization History  Administered Date(s) Administered   Pneumococcal Polysaccharide-23 01/18/2019   Tdap 08/01/2016   Zoster, Live 12/13/2005    TDAP status: Up to date  Flu Vaccine status: Due, Education has been provided regarding the importance of this vaccine. Advised may receive this vaccine at local pharmacy or Health Dept. Aware to provide a copy of the vaccination record if obtained from local pharmacy or Health Dept. Verbalized acceptance and understanding.  Pneumococcal vaccine status: Declined,  Education has been provided regarding the importance of this vaccine but patient still declined. Advised may receive this vaccine at local pharmacy or Health Dept. Aware to provide a copy of the vaccination record if obtained from local pharmacy or Health Dept. Verbalized acceptance and understanding.   Covid-19 vaccine status: Declined, Education has been provided regarding the importance of this vaccine but patient still declined. Advised may receive this vaccine at local pharmacy or Health Dept.or vaccine clinic. Aware to provide a copy of the vaccination record if obtained from local pharmacy or Health Dept. Verbalized acceptance and understanding.  Qualifies for Shingles Vaccine? Yes   Zostavax completed No   Shingrix Completed?: No.    Education has been provided regarding the importance of this vaccine. Patient has been advised to call insurance company to determine out of pocket expense if they have not yet received this vaccine. Advised may also receive vaccine at local pharmacy or Health Dept. Verbalized acceptance and  understanding.  Screening Tests Health Maintenance  Topic Date Due   DEXA SCAN  Never done   MAMMOGRAM  09/25/2021   Pneumonia Vaccine 59+ Years old (2 of 2 - PCV) 09/09/2023 (Originally 01/19/2020)   Zoster Vaccines- Shingrix (1 of 2) 10/20/2023 (Originally 07/20/1999)   INFLUENZA VACCINE  03/12/2024 (Originally 07/14/2023)   Colonoscopy  02/20/2024   Medicare Annual Wellness (AWV)  07/19/2024   DTaP/Tdap/Td (2 - Td or Tdap) 08/01/2026   Hepatitis C Screening  Completed   HPV VACCINES  Aged Out   COVID-19 Vaccine  Discontinued    Health Maintenance  Health Maintenance Due  Topic Date Due   DEXA SCAN  Never done   MAMMOGRAM  09/25/2021    Colorectal cancer screening: Type of screening: Colonoscopy. Completed 02/20/19. Repeat  every 5 years  Mammogram status: Ordered 08/04/22. Pt provided with contact info and advised to call to schedule appt.   Bone Density status: Ordered 08/04/22. Pt provided with contact info and advised to call to schedule appt.  Lung Cancer Screening: (Low Dose CT Chest recommended if Age 40-80 years, 20 pack-year currently smoking OR have quit w/in 15years.) does not qualify.   Lung Cancer Screening Referral: no  Additional Screening:  Hepatitis C Screening: does qualify; Completed 04/08/17  Vision Screening: Recommended annual ophthalmology exams for early detection of glaucoma and other disorders of the eye. Is the patient up to date with their annual eye exam?   Pt declined eye exam. Who is the provider or what is the name of the office in which the patient attends annual eye exams? Unknown provider If pt is not established with a provider, would they like to be referred to a provider to establish care? No .   Dental Screening: Recommended annual dental exams for proper oral hygiene    Community Resource Referral / Chronic Care Management: CRR required this visit?  No   CCM required this visit?  No     Plan:     I have personally reviewed  and noted the following in the patient's chart:   Medical and social history Use of alcohol, tobacco or illicit drugs  Current medications and supplements including opioid prescriptions. Patient is not currently taking opioid prescriptions. Functional ability and status Nutritional status Physical activity Advanced directives List of other physicians Hospitalizations, surgeries, and ER visits in previous 12 months Vitals Screenings to include cognitive, depression, and falls Referrals and appointments  In addition, I have reviewed and discussed with patient certain preventive protocols, quality metrics, and best practice recommendations. A written personalized care plan for preventive services as well as general preventive health recommendations were provided to patient.     Maryan Puls, LPN   12/17/7827   After Visit Summary: (MyChart) Due to this being a telephonic visit, the after visit summary with patients personalized plan was offered to patient via MyChart   Nurse Notes: Pt is moving out of state and will schedule all screenings once she gets moved.  Vaccinations: declines all Influenza vaccine: recommend every Fall Pneumococcal vaccine: recommend once per lifetime Prevnar-20 Tdap vaccine: recommend every 10 years Shingles vaccine: recommend Shingrix which is 2 doses 2-6 months apart and over 90% effective     Covid-19: recommend 2 doses one month apart with a booster 6 months later

## 2023-07-20 NOTE — Patient Instructions (Signed)
Ann Barker , Thank you for taking time to come for your Medicare Wellness Visit. I appreciate your ongoing commitment to your health goals. Please review the following plan we discussed and let me know if I can assist you in the future.   Referrals/Orders/Follow-Ups/Clinician Recommendations: Aim for 30 minutes of exercise or brisk walking, 6-8 glasses of water, and 5 servings of fruits and vegetables each day.   This is a list of the screening recommended for you and due dates:  Health Maintenance  Topic Date Due   Zoster (Shingles) Vaccine (1 of 2) 07/20/1999   DEXA scan (bone density measurement)  Never done   Pneumonia Vaccine (2 of 2 - PCV) 01/19/2020   Mammogram  09/25/2021   Medicare Annual Wellness Visit  03/03/2023   Flu Shot  07/14/2023   Colon Cancer Screening  02/20/2024   DTaP/Tdap/Td vaccine (2 - Td or Tdap) 08/01/2026   Hepatitis C Screening  Completed   HPV Vaccine  Aged Out   COVID-19 Vaccine  Discontinued    Advanced directives: (Declined) Advance directive discussed with you today. Even though you declined this today, please call our office should you change your mind, and we can give you the proper paperwork for you to fill out.  Next Medicare Annual Wellness Visit scheduled for next year: Yes  Preventive Care 63 Years and Older, Female Preventive care refers to lifestyle choices and visits with your health care provider that can promote health and wellness. What does preventive care include? A yearly physical exam. This is also called an annual well check. Dental exams once or twice a year. Routine eye exams. Ask your health care provider how often you should have your eyes checked. Personal lifestyle choices, including: Daily care of your teeth and gums. Regular physical activity. Eating a healthy diet. Avoiding tobacco and drug use. Limiting alcohol use. Practicing safe sex. Taking low-dose aspirin every day. Taking vitamin and mineral supplements as  recommended by your health care provider. What happens during an annual well check? The services and screenings done by your health care provider during your annual well check will depend on your age, overall health, lifestyle risk factors, and family history of disease. Counseling  Your health care provider may ask you questions about your: Alcohol use. Tobacco use. Drug use. Emotional well-being. Home and relationship well-being. Sexual activity. Eating habits. History of falls. Memory and ability to understand (cognition). Work and work Astronomer. Reproductive health. Screening  You may have the following tests or measurements: Height, weight, and BMI. Blood pressure. Lipid and cholesterol levels. These may be checked every 5 years, or more frequently if you are over 42 years old. Skin check. Lung cancer screening. You may have this screening every year starting at age 20 if you have a 30-pack-year history of smoking and currently smoke or have quit within the past 15 years. Fecal occult blood test (FOBT) of the stool. You may have this test every year starting at age 33. Flexible sigmoidoscopy or colonoscopy. You may have a sigmoidoscopy every 5 years or a colonoscopy every 10 years starting at age 29. Hepatitis C blood test. Hepatitis B blood test. Sexually transmitted disease (STD) testing. Diabetes screening. This is done by checking your blood sugar (glucose) after you have not eaten for a while (fasting). You may have this done every 1-3 years. Bone density scan. This is done to screen for osteoporosis. You may have this done starting at age 52. Mammogram. This may be done every  1-2 years. Talk to your health care provider about how often you should have regular mammograms. Talk with your health care provider about your test results, treatment options, and if necessary, the need for more tests. Vaccines  Your health care provider may recommend certain vaccines, such  as: Influenza vaccine. This is recommended every year. Tetanus, diphtheria, and acellular pertussis (Tdap, Td) vaccine. You may need a Td booster every 10 years. Zoster vaccine. You may need this after age 18. Pneumococcal 13-valent conjugate (PCV13) vaccine. One dose is recommended after age 60. Pneumococcal polysaccharide (PPSV23) vaccine. One dose is recommended after age 48. Talk to your health care provider about which screenings and vaccines you need and how often you need them. This information is not intended to replace advice given to you by your health care provider. Make sure you discuss any questions you have with your health care provider. Document Released: 12/26/2015 Document Revised: 08/18/2016 Document Reviewed: 09/30/2015 Elsevier Interactive Patient Education  2017 [employer].  Fall Prevention in the Home Falls can cause injuries. They can happen to people of all ages. There are many things you can do to make your home safe and to help prevent falls. What can I do on the outside of my home? Regularly fix the edges of walkways and driveways and fix any cracks. Remove anything that might make you trip as you walk through a door, such as a raised step or threshold. Trim any bushes or trees on the path to your home. Use bright outdoor lighting. Clear any walking paths of anything that might make someone trip, such as rocks or tools. Regularly check to see if handrails are loose or broken. Make sure that both sides of any steps have handrails. Any raised decks and porches should have guardrails on the edges. Have any leaves, snow, or ice cleared regularly. Use sand or salt on walking paths during winter. Clean up any spills in your garage right away. This includes oil or grease spills. What can I do in the bathroom? Use night lights. Install grab bars by the toilet and in the tub and shower. Do not use towel bars as grab bars. Use non-skid mats or decals in the tub or  shower. If you need to sit down in the shower, use a plastic, non-slip stool. Keep the floor dry. Clean up any water that spills on the floor as soon as it happens. Remove soap buildup in the tub or shower regularly. Attach bath mats securely with double-sided non-slip rug tape. Do not have throw rugs and other things on the floor that can make you trip. What can I do in the bedroom? Use night lights. Make sure that you have a light by your bed that is easy to reach. Do not use any sheets or blankets that are too big for your bed. They should not hang down onto the floor. Have a firm chair that has side arms. You can use this for support while you get dressed. Do not have throw rugs and other things on the floor that can make you trip. What can I do in the kitchen? Clean up any spills right away. Avoid walking on wet floors. Keep items that you use a lot in easy-to-reach places. If you need to reach something above you, use a strong step stool that has a grab bar. Keep electrical cords out of the way. Do not use floor polish or wax that makes floors slippery. If you must use wax, use non-skid  floor wax. Do not have throw rugs and other things on the floor that can make you trip. What can I do with my stairs? Do not leave any items on the stairs. Make sure that there are handrails on both sides of the stairs and use them. Fix handrails that are broken or loose. Make sure that handrails are as long as the stairways. Check any carpeting to make sure that it is firmly attached to the stairs. Fix any carpet that is loose or worn. Avoid having throw rugs at the top or bottom of the stairs. If you do have throw rugs, attach them to the floor with carpet tape. Make sure that you have a light switch at the top of the stairs and the bottom of the stairs. If you do not have them, ask someone to add them for you. What else can I do to help prevent falls? Wear shoes that: Do not have high heels. Have  rubber bottoms. Are comfortable and fit you well. Are closed at the toe. Do not wear sandals. If you use a stepladder: Make sure that it is fully opened. Do not climb a closed stepladder. Make sure that both sides of the stepladder are locked into place. Ask someone to hold it for you, if possible. Clearly mark and make sure that you can see: Any grab bars or handrails. First and last steps. Where the edge of each step is. Use tools that help you move around (mobility aids) if they are needed. These include: Canes. Walkers. Scooters. Crutches. Turn on the lights when you go into a dark area. Replace any light bulbs as soon as they burn out. Set up your furniture so you have a clear path. Avoid moving your furniture around. If any of your floors are uneven, fix them. If there are any pets around you, be aware of where they are. Review your medicines with your doctor. Some medicines can make you feel dizzy. This can increase your chance of falling. Ask your doctor what other things that you can do to help prevent falls. This information is not intended to replace advice given to you by your health care provider. Make sure you discuss any questions you have with your health care provider. Document Released: 09/25/2009 Document Revised: 05/06/2016 Document Reviewed: 01/03/2015 Elsevier Interactive Patient Education  2017 [employer].

## 2023-09-11 ENCOUNTER — Other Ambulatory Visit: Payer: Self-pay | Admitting: Primary Care

## 2023-09-11 DIAGNOSIS — I10 Essential (primary) hypertension: Secondary | ICD-10-CM

## 2023-09-11 NOTE — Telephone Encounter (Signed)
Please call patient:  Is she still living in the area?  If so, she's due now for [name]/follow up.   Let me know either way.

## 2023-09-12 NOTE — Telephone Encounter (Signed)
Tried calling patient. Not able to lvm as it is full.

## 2023-10-08 ENCOUNTER — Other Ambulatory Visit: Payer: Self-pay | Admitting: Primary Care

## 2023-10-08 DIAGNOSIS — I10 Essential (primary) hypertension: Secondary | ICD-10-CM

## 2023-10-12 ENCOUNTER — Other Ambulatory Visit: Payer: Self-pay | Admitting: Primary Care

## 2023-10-12 DIAGNOSIS — I10 Essential (primary) hypertension: Secondary | ICD-10-CM

## 2023-12-02 ENCOUNTER — Telehealth: Payer: Self-pay | Admitting: Primary Care

## 2023-12-02 NOTE — Telephone Encounter (Signed)
Communication  Reason for CRM: Patient called to verify if office received paperwork for doctor to sign off on for patient to get her driver's license. When form is done, please fax back to [employer] [phone]). Please call patient is fax is not received.    Received CRM regarding form for patient. Have seen this form in fax queue and put in provider's S drive folder.

## 2023-12-03 NOTE — Telephone Encounter (Signed)
Patient will need office visit for this. Please schedule.

## 2023-12-05 NOTE — Telephone Encounter (Signed)
Patient called back, states she is out of town in [place]. 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 Wants to know if Jae Dire is able to complete this without an OV? Please advise pt

## 2023-12-05 NOTE — Telephone Encounter (Signed)
We can complete a virtual visit. Please set up, thanks!

## 2023-12-05 NOTE — Telephone Encounter (Signed)
Call the pt no answer lvm

## 2023-12-06 NOTE — Telephone Encounter (Signed)
Called pt- no answer-left message to call office

## 2023-12-09 DIAGNOSIS — Z0001 Encounter for general adult medical examination with abnormal findings: Secondary | ICD-10-CM | POA: Diagnosis not present

## 2023-12-09 DIAGNOSIS — Z13820 Encounter for screening for osteoporosis: Secondary | ICD-10-CM | POA: Diagnosis not present

## 2023-12-09 DIAGNOSIS — Z1239 Encounter for other screening for malignant neoplasm of breast: Secondary | ICD-10-CM | POA: Diagnosis not present

## 2023-12-09 DIAGNOSIS — Z1211 Encounter for screening for malignant neoplasm of colon: Secondary | ICD-10-CM | POA: Diagnosis not present

## 2023-12-09 DIAGNOSIS — Z1331 Encounter for screening for depression: Secondary | ICD-10-CM | POA: Diagnosis not present

## 2023-12-09 DIAGNOSIS — Z6839 Body mass index (BMI) 39.0-39.9, adult: Secondary | ICD-10-CM | POA: Diagnosis not present

## 2023-12-09 DIAGNOSIS — E66812 Obesity, class 2: Secondary | ICD-10-CM | POA: Diagnosis not present

## 2023-12-09 DIAGNOSIS — Z1349 Encounter for screening for other developmental delays: Secondary | ICD-10-CM | POA: Diagnosis not present

## 2023-12-09 DIAGNOSIS — I10 Essential (primary) hypertension: Secondary | ICD-10-CM | POA: Diagnosis not present

## 2024-01-10 ENCOUNTER — Other Ambulatory Visit: Payer: Self-pay

## 2024-03-19 ENCOUNTER — Telehealth: Payer: Self-pay | Admitting: Primary Care

## 2024-03-19 NOTE — Telephone Encounter (Signed)
 Copied from CRM [id number]. Topic: Medical Record Request - Records Request >> Mar 19, 2024  4:03 PM Ann Barker wrote: Reason for CRM: Patient called in stating she needs to sign paperwork and would like to have her medical records sent over [facility] [address] [place] [address] [place] [address] Dr. Austin Miles  Phone: [phone] fax:[phone]. If needed, please call [phone]

## 2024-03-20 NOTE — Telephone Encounter (Signed)
 E2C[facility] called regarding patient advised we would need a record release either from the office, or she can complete one in our office to send records over.

## 2024-03-20 NOTE — Telephone Encounter (Signed)
Mail box full not able to leave message at this time.

## 2024-03-20 NOTE — Telephone Encounter (Signed)
 Copied from CRM [id number]. Topic: General - Call Back - No Documentation >> Mar 20, 2024  1:11 PM Isabelle Course C wrote: Reason for CRM: Patient missed call. Will come by to sign paperwork for medical records

## 2024-05-02 ENCOUNTER — Telehealth: Payer: Self-pay

## 2024-05-02 NOTE — Telephone Encounter (Signed)
 Patient last seen in our office on 1/12824. She has not received any refills or phone /my chart messages after that date. [facility]. Ok to remove you as pcp. Would you like us  to reach out to patient to verify change in care?

## 2024-05-03 NOTE — Telephone Encounter (Signed)
 Updated chart  No further action needed at this time.

## 2024-05-03 NOTE — Telephone Encounter (Signed)
 Okay to remove me as PCP.
